# Patient Record
Sex: Male | Born: 1940
Health system: Southern US, Community
[De-identification: ages and names within clinical notes are randomized; demographics above are authoritative.]

## PROBLEM LIST (undated history)

## (undated) DIAGNOSIS — G629 Polyneuropathy, unspecified: Secondary | ICD-10-CM

## (undated) DIAGNOSIS — F419 Anxiety disorder, unspecified: Secondary | ICD-10-CM

## (undated) DIAGNOSIS — E1140 Type 2 diabetes mellitus with diabetic neuropathy, unspecified: Secondary | ICD-10-CM

## (undated) DIAGNOSIS — H499 Unspecified paralytic strabismus: Secondary | ICD-10-CM

## (undated) DIAGNOSIS — F09 Unspecified mental disorder due to known physiological condition: Secondary | ICD-10-CM

## (undated) DIAGNOSIS — N529 Male erectile dysfunction, unspecified: Secondary | ICD-10-CM

## (undated) DIAGNOSIS — R002 Palpitations: Secondary | ICD-10-CM

## (undated) DIAGNOSIS — K635 Polyp of colon: Secondary | ICD-10-CM

## (undated) DIAGNOSIS — E785 Hyperlipidemia, unspecified: Secondary | ICD-10-CM

## (undated) DIAGNOSIS — H49889 Other paralytic strabismus, unspecified eye: Secondary | ICD-10-CM

## (undated) DIAGNOSIS — M1990 Unspecified osteoarthritis, unspecified site: Secondary | ICD-10-CM

## (undated) DIAGNOSIS — R3129 Other microscopic hematuria: Secondary | ICD-10-CM

## (undated) DIAGNOSIS — K649 Unspecified hemorrhoids: Secondary | ICD-10-CM

## (undated) DIAGNOSIS — J986 Disorders of diaphragm: Secondary | ICD-10-CM

## (undated) DIAGNOSIS — C4490 Unspecified malignant neoplasm of skin, unspecified: Secondary | ICD-10-CM

## (undated) DIAGNOSIS — R269 Unspecified abnormalities of gait and mobility: Secondary | ICD-10-CM

## (undated) DIAGNOSIS — F0390 Unspecified dementia without behavioral disturbance: Secondary | ICD-10-CM

## (undated) DIAGNOSIS — I4891 Unspecified atrial fibrillation: Secondary | ICD-10-CM

## (undated) DIAGNOSIS — I2510 Atherosclerotic heart disease of native coronary artery without angina pectoris: Secondary | ICD-10-CM

## (undated) HISTORY — PX: HIP SURGERY: SHX245

## (undated) HISTORY — DX: Anxiety disorder, unspecified: F41.9

## (undated) HISTORY — DX: Unspecified malignant neoplasm of skin, unspecified: C44.90

## (undated) HISTORY — DX: Other microscopic hematuria: R31.29

## (undated) HISTORY — DX: Polyp of colon: K63.5

## (undated) HISTORY — DX: Type 2 diabetes mellitus with diabetic neuropathy, unspecified: E11.40

## (undated) HISTORY — DX: Other paralytic strabismus, unspecified eye: H49.889

## (undated) HISTORY — PX: ANKLE FUSION: SHX881

## (undated) HISTORY — PX: APPENDECTOMY: SHX54

## (undated) HISTORY — DX: Unspecified mental disorder due to known physiological condition: F09

## (undated) HISTORY — DX: Unspecified osteoarthritis, unspecified site: M19.90

## (undated) HISTORY — PX: CARPAL TUNNEL RELEASE: SHX101

## (undated) HISTORY — DX: Male erectile dysfunction, unspecified: N52.9

## (undated) HISTORY — DX: Hyperlipidemia, unspecified: E78.5

## (undated) HISTORY — DX: Atherosclerotic heart disease of native coronary artery without angina pectoris: I25.10

## (undated) HISTORY — DX: Unspecified atrial fibrillation: I48.91

## (undated) HISTORY — DX: Disorders of diaphragm: J98.6

## (undated) HISTORY — DX: Unspecified paralytic strabismus: H49.9

---

## 1962-04-09 HISTORY — PX: HIP SURGERY: SHX245

## 2001-01-03 ENCOUNTER — Encounter (INDEPENDENT_AMBULATORY_CARE_PROVIDER_SITE_OTHER): Payer: Self-pay | Admitting: Specialist

## 2001-01-03 ENCOUNTER — Other Ambulatory Visit: Admission: RE | Admit: 2001-01-03 | Discharge: 2001-01-03 | Payer: Self-pay | Admitting: Gastroenterology

## 2003-04-10 HISTORY — PX: KNEE SURGERY: SHX244

## 2003-05-11 ENCOUNTER — Inpatient Hospital Stay (HOSPITAL_COMMUNITY): Admission: RE | Admit: 2003-05-11 | Discharge: 2003-05-13 | Payer: Self-pay | Admitting: Orthopedic Surgery

## 2004-02-11 ENCOUNTER — Ambulatory Visit: Payer: Self-pay | Admitting: Internal Medicine

## 2004-02-28 ENCOUNTER — Ambulatory Visit: Payer: Self-pay | Admitting: Internal Medicine

## 2004-06-07 ENCOUNTER — Ambulatory Visit: Payer: Self-pay | Admitting: Internal Medicine

## 2004-06-15 ENCOUNTER — Ambulatory Visit: Payer: Self-pay | Admitting: Internal Medicine

## 2004-08-29 ENCOUNTER — Ambulatory Visit: Payer: Self-pay | Admitting: Internal Medicine

## 2004-12-12 ENCOUNTER — Ambulatory Visit: Payer: Self-pay | Admitting: Internal Medicine

## 2005-01-12 ENCOUNTER — Ambulatory Visit: Payer: Self-pay | Admitting: Internal Medicine

## 2005-05-31 ENCOUNTER — Ambulatory Visit: Payer: Self-pay | Admitting: Internal Medicine

## 2005-07-30 ENCOUNTER — Ambulatory Visit: Payer: Self-pay | Admitting: Internal Medicine

## 2005-11-06 ENCOUNTER — Ambulatory Visit: Payer: Self-pay | Admitting: Internal Medicine

## 2005-12-31 ENCOUNTER — Ambulatory Visit: Payer: Self-pay | Admitting: Internal Medicine

## 2006-02-01 ENCOUNTER — Ambulatory Visit: Payer: Self-pay | Admitting: Internal Medicine

## 2006-02-01 LAB — CONVERTED CEMR LAB
ALT: 37 units/L (ref 0–40)
MCHC: 33.6 g/dL (ref 30.0–36.0)
MCV: 90.1 fL (ref 78.0–100.0)
Microalb Creat Ratio: 7 mg/g (ref 0.0–30.0)
RBC: 4.55 M/uL (ref 4.22–5.81)

## 2006-02-18 ENCOUNTER — Ambulatory Visit: Payer: Self-pay | Admitting: Internal Medicine

## 2006-03-19 ENCOUNTER — Ambulatory Visit: Payer: Self-pay | Admitting: Internal Medicine

## 2006-03-19 LAB — CONVERTED CEMR LAB: PSA: 1.5 ng/mL (ref 0.10–4.00)

## 2006-05-04 DIAGNOSIS — E119 Type 2 diabetes mellitus without complications: Secondary | ICD-10-CM | POA: Insufficient documentation

## 2006-05-04 DIAGNOSIS — M1990 Unspecified osteoarthritis, unspecified site: Secondary | ICD-10-CM | POA: Insufficient documentation

## 2006-05-04 DIAGNOSIS — Z860100 Personal history of colon polyps, unspecified: Secondary | ICD-10-CM | POA: Insufficient documentation

## 2006-05-04 DIAGNOSIS — I10 Essential (primary) hypertension: Secondary | ICD-10-CM

## 2006-05-04 DIAGNOSIS — I2510 Atherosclerotic heart disease of native coronary artery without angina pectoris: Secondary | ICD-10-CM

## 2006-05-04 DIAGNOSIS — F528 Other sexual dysfunction not due to a substance or known physiological condition: Secondary | ICD-10-CM

## 2006-06-03 ENCOUNTER — Encounter: Admission: RE | Admit: 2006-06-03 | Discharge: 2006-06-03 | Payer: Self-pay | Admitting: Orthopedic Surgery

## 2006-06-11 ENCOUNTER — Encounter: Admission: RE | Admit: 2006-06-11 | Discharge: 2006-06-11 | Payer: Self-pay | Admitting: Orthopedic Surgery

## 2006-08-16 ENCOUNTER — Ambulatory Visit: Payer: Self-pay | Admitting: Internal Medicine

## 2006-08-16 LAB — CONVERTED CEMR LAB
Chloride: 103 meq/L (ref 96–112)
Hgb A1c MFr Bld: 8.2 % — ABNORMAL HIGH (ref 4.6–6.1)
Potassium: 4.2 meq/L (ref 3.5–5.3)

## 2006-10-01 ENCOUNTER — Ambulatory Visit: Payer: Self-pay | Admitting: Internal Medicine

## 2006-10-01 DIAGNOSIS — E785 Hyperlipidemia, unspecified: Secondary | ICD-10-CM

## 2006-10-02 ENCOUNTER — Telehealth (INDEPENDENT_AMBULATORY_CARE_PROVIDER_SITE_OTHER): Payer: Self-pay | Admitting: *Deleted

## 2006-10-03 ENCOUNTER — Encounter: Payer: Self-pay | Admitting: Internal Medicine

## 2006-10-03 ENCOUNTER — Emergency Department (HOSPITAL_COMMUNITY): Admission: EM | Admit: 2006-10-03 | Discharge: 2006-10-03 | Payer: Self-pay | Admitting: Emergency Medicine

## 2006-10-12 ENCOUNTER — Inpatient Hospital Stay (HOSPITAL_COMMUNITY): Admission: EM | Admit: 2006-10-12 | Discharge: 2006-10-14 | Payer: Self-pay | Admitting: Emergency Medicine

## 2006-10-14 ENCOUNTER — Ambulatory Visit: Payer: Self-pay | Admitting: Internal Medicine

## 2006-10-21 ENCOUNTER — Encounter: Payer: Self-pay | Admitting: Internal Medicine

## 2006-10-23 ENCOUNTER — Encounter: Payer: Self-pay | Admitting: Internal Medicine

## 2006-10-26 ENCOUNTER — Encounter: Payer: Self-pay | Admitting: Internal Medicine

## 2006-10-30 ENCOUNTER — Telehealth (INDEPENDENT_AMBULATORY_CARE_PROVIDER_SITE_OTHER): Payer: Self-pay | Admitting: *Deleted

## 2006-11-14 ENCOUNTER — Encounter: Payer: Self-pay | Admitting: Internal Medicine

## 2006-11-21 ENCOUNTER — Telehealth: Payer: Self-pay | Admitting: Internal Medicine

## 2006-12-02 ENCOUNTER — Ambulatory Visit: Payer: Self-pay | Admitting: Internal Medicine

## 2006-12-02 DIAGNOSIS — Z96649 Presence of unspecified artificial hip joint: Secondary | ICD-10-CM | POA: Insufficient documentation

## 2006-12-04 ENCOUNTER — Telehealth (INDEPENDENT_AMBULATORY_CARE_PROVIDER_SITE_OTHER): Payer: Self-pay | Admitting: *Deleted

## 2006-12-04 LAB — CONVERTED CEMR LAB
ALT: 14 units/L (ref 0–53)
AST: 16 units/L (ref 0–37)
BUN: 15 mg/dL (ref 6–23)
Creatinine, Ser: 0.6 mg/dL (ref 0.4–1.5)
Hgb A1c MFr Bld: 5.9 % (ref 4.6–6.0)

## 2006-12-09 ENCOUNTER — Encounter: Payer: Self-pay | Admitting: Internal Medicine

## 2006-12-19 ENCOUNTER — Ambulatory Visit: Payer: Self-pay | Admitting: Internal Medicine

## 2006-12-19 DIAGNOSIS — F411 Generalized anxiety disorder: Secondary | ICD-10-CM | POA: Insufficient documentation

## 2007-03-31 ENCOUNTER — Telehealth (INDEPENDENT_AMBULATORY_CARE_PROVIDER_SITE_OTHER): Payer: Self-pay | Admitting: *Deleted

## 2007-04-01 ENCOUNTER — Encounter: Payer: Self-pay | Admitting: Internal Medicine

## 2007-04-01 ENCOUNTER — Encounter (INDEPENDENT_AMBULATORY_CARE_PROVIDER_SITE_OTHER): Payer: Self-pay | Admitting: *Deleted

## 2007-04-10 HISTORY — PX: HIP SURGERY: SHX245

## 2007-04-29 ENCOUNTER — Ambulatory Visit: Payer: Self-pay | Admitting: Internal Medicine

## 2007-05-01 ENCOUNTER — Encounter: Payer: Self-pay | Admitting: Internal Medicine

## 2007-05-05 LAB — CONVERTED CEMR LAB: Hgb A1c MFr Bld: 6.9 % — ABNORMAL HIGH (ref 4.6–6.0)

## 2007-05-19 ENCOUNTER — Telehealth (INDEPENDENT_AMBULATORY_CARE_PROVIDER_SITE_OTHER): Payer: Self-pay | Admitting: *Deleted

## 2007-05-20 ENCOUNTER — Encounter: Payer: Self-pay | Admitting: Internal Medicine

## 2007-05-20 ENCOUNTER — Telehealth (INDEPENDENT_AMBULATORY_CARE_PROVIDER_SITE_OTHER): Payer: Self-pay | Admitting: *Deleted

## 2007-05-21 ENCOUNTER — Telehealth (INDEPENDENT_AMBULATORY_CARE_PROVIDER_SITE_OTHER): Payer: Self-pay | Admitting: *Deleted

## 2007-09-19 ENCOUNTER — Ambulatory Visit: Payer: Self-pay | Admitting: Internal Medicine

## 2007-09-22 LAB — CONVERTED CEMR LAB
ALT: 25 units/L (ref 0–53)
AST: 22 units/L (ref 0–37)
Calcium: 9.4 mg/dL (ref 8.4–10.5)
Creatinine, Ser: 0.8 mg/dL (ref 0.4–1.5)
Creatinine,U: 36.4 mg/dL
Eosinophils Absolute: 0.2 10*3/uL (ref 0.0–0.7)
GFR calc Af Amer: 123 mL/min
HDL: 44.6 mg/dL (ref >39.0)
Lymphocytes Relative: 20.7 % (ref 12.0–46.0)
Platelets: 255 10*3/uL (ref 150–400)
Potassium: 4.3 meq/L (ref 3.5–5.1)
Sodium: 139 meq/L (ref 135–145)
Triglycerides: 29 mg/dL (ref 0–149)
VLDL: 6 mg/dL (ref 0–40)
WBC: 5.5 10*3/uL (ref 4.5–10.5)

## 2007-11-16 ENCOUNTER — Encounter: Payer: Self-pay | Admitting: Internal Medicine

## 2007-11-17 ENCOUNTER — Encounter: Payer: Self-pay | Admitting: Internal Medicine

## 2007-11-25 ENCOUNTER — Encounter: Payer: Self-pay | Admitting: Internal Medicine

## 2007-12-02 ENCOUNTER — Encounter: Payer: Self-pay | Admitting: Internal Medicine

## 2007-12-08 ENCOUNTER — Encounter: Payer: Self-pay | Admitting: Internal Medicine

## 2007-12-16 ENCOUNTER — Encounter: Payer: Self-pay | Admitting: Internal Medicine

## 2007-12-17 ENCOUNTER — Telehealth (INDEPENDENT_AMBULATORY_CARE_PROVIDER_SITE_OTHER): Payer: Self-pay | Admitting: *Deleted

## 2008-01-06 ENCOUNTER — Encounter: Payer: Self-pay | Admitting: Internal Medicine

## 2008-01-09 ENCOUNTER — Telehealth (INDEPENDENT_AMBULATORY_CARE_PROVIDER_SITE_OTHER): Payer: Self-pay | Admitting: *Deleted

## 2008-02-16 ENCOUNTER — Ambulatory Visit: Payer: Self-pay | Admitting: Internal Medicine

## 2008-06-24 ENCOUNTER — Ambulatory Visit: Payer: Self-pay | Admitting: Internal Medicine

## 2008-06-29 LAB — CONVERTED CEMR LAB
ALT: 19 units/L (ref 0–53)
BUN: 14 mg/dL (ref 6–23)
Basophils Absolute: 0 10*3/uL (ref 0.0–0.1)
Basophils Relative: 0.2 % (ref 0.0–3.0)
Calcium: 8.7 mg/dL (ref 8.4–10.5)
Cholesterol: 171 mg/dL (ref 0–200)
Creatinine,U: 116.3 mg/dL
GFR calc non Af Amer: 141.56 mL/min (ref >60)
Glucose, Bld: 115 mg/dL — ABNORMAL HIGH (ref 70–99)
HDL: 48.2 mg/dL (ref >39.00)
Hemoglobin: 13.6 g/dL (ref 13.0–17.0)
LDL Cholesterol: 114 mg/dL — ABNORMAL HIGH (ref 0–99)
Lymphs Abs: 0.5 10*3/uL — ABNORMAL LOW (ref 0.7–4.0)
Microalb, Ur: 1.8 mg/dL (ref 0.0–1.9)
Monocytes Absolute: 0.3 10*3/uL (ref 0.1–1.0)
Potassium: 3.9 meq/L (ref 3.5–5.1)
Sodium: 138 meq/L (ref 135–145)
Total CHOL/HDL Ratio: 4

## 2008-08-18 ENCOUNTER — Ambulatory Visit: Payer: Self-pay | Admitting: Family Medicine

## 2008-10-12 ENCOUNTER — Encounter: Payer: Self-pay | Admitting: Internal Medicine

## 2008-10-14 ENCOUNTER — Ambulatory Visit: Payer: Self-pay | Admitting: Family Medicine

## 2008-11-30 ENCOUNTER — Encounter: Payer: Self-pay | Admitting: Internal Medicine

## 2008-12-30 ENCOUNTER — Encounter: Payer: Self-pay | Admitting: Internal Medicine

## 2009-01-05 ENCOUNTER — Ambulatory Visit: Payer: Self-pay | Admitting: Internal Medicine

## 2009-01-18 ENCOUNTER — Ambulatory Visit: Payer: Self-pay | Admitting: Internal Medicine

## 2009-02-02 ENCOUNTER — Encounter: Payer: Self-pay | Admitting: Internal Medicine

## 2009-02-08 ENCOUNTER — Ambulatory Visit: Payer: Self-pay | Admitting: Internal Medicine

## 2009-09-14 ENCOUNTER — Encounter: Payer: Self-pay | Admitting: Internal Medicine

## 2009-12-15 ENCOUNTER — Telehealth (INDEPENDENT_AMBULATORY_CARE_PROVIDER_SITE_OTHER): Payer: Self-pay | Admitting: *Deleted

## 2009-12-21 ENCOUNTER — Ambulatory Visit: Payer: Self-pay | Admitting: Internal Medicine

## 2009-12-26 LAB — CONVERTED CEMR LAB
CO2: 28 meq/L (ref 19–32)
Chloride: 105 meq/L (ref 96–112)
Creatinine, Ser: 0.7 mg/dL (ref 0.4–1.5)
GFR calc non Af Amer: 117.99 mL/min (ref >60)
Glucose, Bld: 138 mg/dL — ABNORMAL HIGH (ref 70–99)
Potassium: 4.8 meq/L (ref 3.5–5.1)
Sodium: 142 meq/L (ref 135–145)
TSH: 1.74 microintl units/mL (ref 0.35–5.50)

## 2010-03-15 ENCOUNTER — Encounter: Payer: Self-pay | Admitting: Internal Medicine

## 2010-04-11 ENCOUNTER — Ambulatory Visit
Admission: RE | Admit: 2010-04-11 | Discharge: 2010-04-11 | Payer: Self-pay | Source: Home / Self Care | Attending: Internal Medicine | Admitting: Internal Medicine

## 2010-04-13 LAB — CONVERTED CEMR LAB
Basophils Relative: 0.5 % (ref 0.0–3.0)
CO2: 29 meq/L (ref 19–32)
Eosinophils Absolute: 0.1 10*3/uL (ref 0.0–0.7)
Glucose, Bld: 109 mg/dL — ABNORMAL HIGH (ref 70–99)
Hemoglobin: 13.9 g/dL (ref 13.0–17.0)
Lymphocytes Relative: 21.2 % (ref 12.0–46.0)
Monocytes Relative: 7 % (ref 3.0–12.0)
Platelets: 223 10*3/uL (ref 150.0–400.0)
WBC: 5.7 10*3/uL (ref 4.5–10.5)

## 2010-05-07 LAB — CONVERTED CEMR LAB
AST: 21 units/L
Albumin: 4.2 g/dL
Alkaline Phosphatase: 101 units/L (ref 39–117)
Alkaline Phosphatase: 83 units/L
Alkaline Phosphatase: 88 units/L
Basophils Relative: 0.1 % (ref 0.0–1.0)
Calcium: 9 mg/dL
Chloride, Serum: 104 mmol/L
Cholesterol: 131 mg/dL
Cholesterol: 149 mg/dL (ref 0–200)
Creatinine, Ser: 0.8 mg/dL
Glucose, Bld: 92 mg/dL
HDL: 48.6 mg/dL (ref >39.0)
HDL: 51 mg/dL
HDL: 55 mg/dL
Hemoglobin: 10.8 g/dL
Hemoglobin: 13.4 g/dL
Hgb A1c MFr Bld: 6.4 %
LDL Cholesterol: 89 mg/dL (ref 0–99)
Lymphocytes Relative: 12.8 % (ref 12.0–46.0)
MCHC: 32.9 g/dL
MCV: 89.8 fL (ref 78.0–100.0)
Monocytes Absolute: 0.6 10*3/uL (ref 0.2–0.7)
Monocytes Relative: 6.8 % (ref 3.0–11.0)
Neutrophils Relative %: 79.1 % — ABNORMAL HIGH (ref 43.0–77.0)
Platelets: 206 10*3/uL
Potassium, serum: 4.4 mmol/L
RBC count: 3.86 10*6/uL
RBC: 4.27 M/uL (ref 4.22–5.81)
RDW: 13.1 % (ref 11.5–14.6)
Sodium, serum: 138 mmol/L
TIBC: 344.7 ug/dL
Total Bilirubin: 0.6 mg/dL
Total Bilirubin: 0.7 mg/dL
Total CHOL/HDL Ratio: 3.1
Total Protein: 7.2 g/dL
Triglycerides: 57 mg/dL (ref 0–149)
WBC, blood: 6.2 10*3/uL
WBC: 8.4 10*3/uL (ref 4.5–10.5)

## 2010-05-09 NOTE — Progress Notes (Signed)
Summary: appt--has been sched  Phone Note Outgoing Call   Call placed by: Army Fossa CMA,  December 15, 2009 8:46 AM Summary of Call: Pt needs a 6 month f/u with Dr.Paz. Army Fossa CMA  December 15, 2009 8:46 AM   Follow-up for Phone Call        appt sched for 9/15 at 8:20am Follow-up by: Jerolyn Shin,  December 15, 2009 2:51 PM

## 2010-05-09 NOTE — Assessment & Plan Note (Signed)
Summary: 6 month followup--fasting labs???///sph   Vital Signs:  Patient profile:   70 year old male Height:      78 inches Weight:      275 pounds BMI:     31.89 Pulse rate:   62 / minute Pulse rhythm:   regular BP sitting:   118 / 70  (left arm) Cuff size:   large  Vitals Entered By: Army Fossa CMA (December 21, 2009 8:15 AM) CC: 6 month f/u- Fasting Comments pharm- [facility] [address] [place] Flu shot   History of Present Illness: routine office visit Diabetes --  last year, his A1c was below 5, they discontinued glipizide and he stayed on Lantus. A1c  3 months ago at the [place] was 6.8 Hypertension-- ambulatory BPs  120/60s, good medication compliance  HYPERLIPIDEMIA -- reports  a TC of 130  3 months ago at the [place]   Osteoarthritis-- doing great, very active, back doing his bike  Anxiety-- on fluoxetine , symptoms well controlled     Current Medications (verified): 1)  Metformin Hcl 1000 Mg Tabs (Metformin Hcl) .... Take 1 Tablet By Mouth Twice A Day 2)  Lantus Solostar 100 Unit/ml  Soln (Insulin Glargine) .Marland KitchenMarland KitchenMarland Kitchen 15u At Bedtime 3)  Furosemide 20 Mg  Tabs (Furosemide) .Marland Kitchen.. 1 By Mouth Once Daily 4)  Cozaar 50 Mg  Tabs (Losartan Potassium) .Marland Kitchen.. 1 By Mouth Qd 5)  Simvastatin 80 Mg  Tabs (Simvastatin) .Marland Kitchen.. 1 By Mouth Once Daily 6)  Prilosec Otc 20 Mg  Tbec (Omeprazole Magnesium) .Marland Kitchen.. 1 By Mouth Once Daily 7)  Chromium Picolinate .Marland Kitchen.. 400 2 By Mouth Once Daily 8)  Fish Oil   Oil (Fish Oil) 9)  B 12 10)  Mvi 11)  Asa .Marland KitchenMarland Kitchen. 1 A Day 12)  Athletes Foot Cream 1 %  Crea (Terbinafine Hcl) 13)  Tennis Shoes .... Dx Diabetes With Neuropathy 14)  Claritin 10 Mg Tabs (Loratadine) .Marland Kitchen.. 1 By Mouth Once Daily 15)  Fluoxetine Hcl 20 Mg Caps (Fluoxetine Hcl) .... Take 2 Caples Daily 16)  Astepro 0.15 % Soln (Azelastine Hcl) .... 2 Sprays On Each Side of The Nose Daily  Allergies (verified): No Known Drug Allergies  Past History:  Past Medical History: Reviewed history from 02/08/2009 and  no changes required. 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 Carpal tunnel release (2000) bilateral Hip fracture , remote  (R) 1964 (2 pins) knee surgery  2005 Appendectomy 10-2006:  HIP FRACTURE, RIGHT--s/p hip replacement, s/p revision August 2009 due to dislocation x 3   Social History: Married working FT  still rydes bike, upto 10 miles sometimes tobacco-- quit in the 80s  ETOH-- rarely   Review of Systems CV:  Denies chest pain or discomfort and swelling of feet; 3 months ago had episode of a heart rate of around 119, his usual heart rate is in the 60s. This was in the setting of resting, lasted 2 hours; there was no chest pain, diaphoresis, shortness of breath or syncope.Marland Kitchen Resp:  Denies cough and shortness of breath. GI:  Denies diarrhea, nausea, and vomiting.  Physical Exam  General:  alert, well-developed, and well-nourished.  Lungs:  normal respiratory effort, no intercostal retractions, no accessory muscle use, and normal breath sounds.   Heart:  normal rate, regular rhythm, and no murmur.   Extremities:  no edema Psych:  Oriented X3, memory intact for recent and remote, normally interactive, good eye contact, not anxious appearing, and not depressed appearing.     Impression & Recommendations:  Problem # 1:  HYPERLIPIDEMIA (ICD-272.4) total cholesterol 153 months ago at the [place].  He is on a high dose of simvastatin and evidently tolerating well. No change His updated medication list for this problem includes:    Simvastatin 80 Mg Tabs (Simvastatin) .Marland Kitchen... 1 by mouth once  daily  Labs Reviewed: SGOT: 21 (11/30/2008)   SGPT: 25 (11/30/2008)   HDL:51 (11/30/2008), 48.20 (06/24/2008)  LDL:79 (11/30/2008), 114 (16/01/9603)  Chol:143 (11/30/2008), 171 (06/24/2008)  Trig:67 (11/30/2008), 45.0 (06/24/2008)  Orders: TLB-ALT (SGPT) (84460-ALT) TLB-AST (SGOT) (84450-SGOT) TLB-TSH (Thyroid Stimulating Hormone) (84443-TSH)  Problem # 2:  HYPERTENSION (ICD-401.9) well-controlled His updated medication list for this problem includes:    Furosemide 20 Mg Tabs (Furosemide) .Marland Kitchen... 1 by mouth once daily    Cozaar 50 Mg Tabs (Losartan potassium) .Marland Kitchen... 1 by mouth qd  BP today: 118/70 Prior BP: 150/70 (02/08/2009)  Labs Reviewed: K+: 4.4 (11/30/2008) Creat: : 0.80 (11/30/2008)   Chol: 143 (11/30/2008)   HDL: 51 (11/30/2008)   LDL: 79 (11/30/2008)   TG: 67 (11/30/2008)  Orders: TLB-BMP (Basic Metabolic Panel-BMET) (80048-METABOL) Specimen Handling (54098)  Problem # 3:  DIABETES MELLITUS, TYPE II (ICD-250.00)    last year, his A1c was below 5, they discontinued glipizide and he stayed on Lantus. A1c above 3 months ago at the [place] was 6.8 labs The following medications were removed from the medication list:    Glipizide 10 Mg Tabs (Glipizide) .Marland Kitchen... 1/2 tablet twice a day His updated medication list for this problem includes:    Metformin Hcl 1000 Mg Tabs (Metformin hcl) .Marland Kitchen... Take 1 tablet by mouth twice a day    Lantus Solostar 100 Unit/ml Soln (Insulin glargine) .Marland KitchenMarland KitchenMarland KitchenMarland Kitchen 15u at bedtime    Cozaar 50 Mg Tabs (Losartan potassium) .Marland Kitchen... 1 by mouth qd  Labs Reviewed: Creat: 0.80 (11/30/2008)    Reviewed HgBA1c results: 4.1 (11/30/2008)  6.1 (06/24/2008)  Orders: Venipuncture (11914) TLB-A1C / Hgb A1C (Glycohemoglobin) (83036-A1C) Specimen Handling (78295)  Problem # 4:  CORONARY ARTERY DISEASE (ICD-414.00) single episode of tachycardia 3 months ago, no associated symptoms. see history of present illness Recommend observation, patient will call if this happens again  or if he has actual palpitations  His updated medication list for this problem includes:    Furosemide 20 Mg Tabs (Furosemide) .Marland Kitchen... 1 by mouth once daily    Cozaar 50 Mg Tabs (Losartan potassium) .Marland Kitchen... 1 by mouth qd  Complete Medication List: 1)  Metformin Hcl 1000 Mg Tabs (Metformin hcl) .... Take 1 tablet by mouth twice a day 2)  Lantus Solostar 100 Unit/ml Soln (Insulin glargine) .Marland KitchenMarland KitchenMarland Kitchen 15u at bedtime 3)  Furosemide 20 Mg Tabs (Furosemide) .Marland Kitchen.. 1 by mouth once daily 4)  Cozaar 50 Mg Tabs (Losartan potassium) .Marland Kitchen.. 1 by mouth qd 5)  Simvastatin 80 Mg Tabs (Simvastatin) .Marland Kitchen.. 1 by mouth once daily 6)  Prilosec Otc 20 Mg Tbec (Omeprazole magnesium) .Marland Kitchen.. 1 by mouth once daily 7)  Chromium Picolinate  .Marland Kitchen.. 400 2 by mouth once daily 8)  Fish Oil Oil (Fish oil) 9)  B 12  10)  Mvi  11)  Asa  .Marland Kitchen.. 1 a  day 12)  Athletes Foot Cream 1 % Crea (Terbinafine hcl) 13)  Tennis Shoes  .... Dx diabetes with neuropathy 14)  Claritin 10 Mg Tabs (Loratadine) .Marland Kitchen.. 1 by mouth once daily 15)  Fluoxetine Hcl 20 Mg Caps (Fluoxetine hcl) .... Take 2 caples daily 16)  Astepro 0.15 % Soln (Azelastine hcl) .... 2 sprays on each side of the nose daily  Other Orders: Flu Vaccine 39yrs + MEDICARE PATIENTS (E4540) Administration Flu vaccine - MCR (J8119)  Patient Instructions: 1)  Please schedule a follow-up appointment in 4 months , fasting, physical exam Flu Vaccine Consent Questions     Do you have a history of severe allergic reactions to this vaccine? no    Any prior history of allergic reactions to egg and/or gelatin? no    Do you have a sensitivity to the preservative Thimersol? no    Do you have a past history of Guillan-Barre Syndrome? no    Do you currently have an acute febrile illness? no    Have you ever had a severe reaction to latex? no    Vaccine information given and explained to patient? yes    Are you currently pregnant? no    Lot Number:[id number]   Exp Date:10/07/2010   Site Given  Left  Deltoid IM 1)  Please schedule a follow-up appointment in 4 months , fasting, physical exam     .[email or web address]

## 2010-05-09 NOTE — Letter (Signed)
Summary: Fax Regarding Clinical Research Trial for Shingles Vaccine  Fax Regarding Clinical Research Trial for Shingles Vaccine   Imported By: Lanelle Bal 09/22/2009 09:48:44  _____________________________________________________________________  External Attachment:    Type:   Image     Comment:   External Document

## 2010-05-11 NOTE — Assessment & Plan Note (Signed)
Summary: yearly check /cbs   Vital Signs:  Patient profile:   70 year old male Height:      78 inches Weight:      251.13 pounds Pulse rate:   82 / minute Pulse rhythm:   regular BP sitting:   124 / 70  (left arm) Cuff size:   large  Vitals Entered By: Army Fossa CMA (April 11, 2010 8:00 AM) CC: CPX, fasting- no complaints  Comments [facility] [address]    History of Present Illness: Here for Medicare AWV: 1.   Risk factors based on Past M, S, F history:reviewed  2.   Physical Activities: extremely active, bike 3.   Depression/mood: doing well, no depression reported or noted  4.   Hearing: wnl reportedly , no problems noted during normal conversatio  5.   ADL's:totally  independent  6.   Fall Risk: low risk  7.   Home Safety: does feel safe at home 8.   Height, weight, &visual acuity:see VS, uses glases  9.   Counseling: yes, see a/p 10.   Labs ordered based on risk factors: yes  11.           Referral Coordination, if needed  12.           Care Plan, see a/p  13.            Cognitive Assessment: motor , memory and cognition seem normal-appropiate  in addition, we discussed the following  Diabetes -- currently on 20u once daily, CBGs   ~ 100 or less in AM, 150 at night  A1C 7.1 at the [place] 3 months ago Hypertension-- ambulatory BPs   ~ 120/70s sometimes lower ; pulse in the 50 (no sx) good medication compliance  HYPERLIPIDEMIA -- good medication compliance  Osteoarthritis-- h/o hip replacement , f/u by ortho    Preventive Screening-Counseling & Management  Alcohol-Tobacco     Smoking Status: never  Caffeine-Diet-Exercise     Does Patient Exercise: no  Current Medications (verified): 1)  Metformin Hcl 1000 Mg Tabs (Metformin Hcl) .... Take 1 Tablet By Mouth Twice A Day 2)  Lantus Solostar 100 Unit/ml  Soln (Insulin Glargine) .... 20 U At Bedtime 3)  Furosemide 20 Mg  Tabs (Furosemide) .Marland Kitchen.. 1 By Mouth Once Daily 4)  Cozaar 50 Mg  Tabs (Losartan Potassium)  .Marland Kitchen.. 1 By Mouth Qd 5)  Simvastatin 80 Mg  Tabs (Simvastatin) .Marland Kitchen.. 1 By Mouth Once Daily 6)  Prilosec Otc 20 Mg  Tbec (Omeprazole Magnesium) .Marland Kitchen.. 1 By Mouth Once Daily 7)  Chromium Picolinate .Marland Kitchen.. 400 2 By Mouth Once Daily 8)  Fish Oil   Oil (Fish Oil) 9)  B 12 10)  Mvi 11)  Asa .Marland KitchenMarland Kitchen. 1 A Day 12)  Athletes Foot Cream 1 %  Crea (Terbinafine Hcl) 13)  Tennis Shoes .... Dx Diabetes With Neuropathy 14)  Claritin 10 Mg Tabs (Loratadine) .Marland Kitchen.. 1 By Mouth Once Daily 15)  Fluoxetine Hcl 20 Mg Caps (Fluoxetine Hcl) .... Take 2 Caples Daily 16)  Astepro 0.15 % Soln (Azelastine Hcl) .... 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 Carpal tunnel release (2000) bilateral Hip fracture , remote  (R) 1964 (2 pins) knee surgery  2005 Appendectomy 10-2006:  HIP FRACTURE, RIGHT--s/p hip replacement, s/p revision August 2009 due to dislocation x 3   Family History: Reviewed history from 02/08/2009 and no changes required. colon ca--no prostate ca--no MI-- no  DM-- GM, aunt COPD-- father, smoker   Social History: Reviewed history from 12/21/2009 and no changes required. Married working FT  still rydes bike, upto 10 miles sometimes tobacco-- quit in the 80s  ETOH-- rarely Smoking Status:  never Does Patient Exercise:  no  Review of Systems CV:  Denies chest pain or discomfort, palpitations, and swelling of feet. Resp:  Denies cough and  wheezing. GI:  Denies bloody stools, nausea, and vomiting. GU:  Denies hematuria, urinary frequency, and urinary hesitancy.  Physical Exam  General:  alert, well-developed, and well-nourished.   Neck:  normal carotid upstroke.  no thyromegaly Lungs:  normal respiratory effort, no intercostal retractions, no accessory muscle use, and normal breath sounds.   Heart:  normal rate, regular rhythm, and no murmur.   Abdomen:  soft, non-tender, no distention, and no masses.   no hepatomegaly and no splenomegaly.   Rectal:  done at the [place] approximately 6-11 per patient Extremities:  no  lower extremity edema Psych:  Cognition and judgment appear intact. Alert and cooperative with normal attention span and concentration.    Impression & Recommendations:  Problem # 1:  HEALTH SCREENING (ICD-V70.0)  Td 2003 pneumonia shot 2002, had another one at the [place] later  had flu shot this year had a shingles shot  already  Cscope 2006 and  2010 @ the [place]. Last cscope was  negative, next 2015 normal PSA at this office last year, reportedly had a PSA-DRE at the [place]  ~ 9/11  Diet and exercise discussed recommend also to  bring his results from the [place]  Orders: Medicare -1st Annual Wellness Visit [phone])  Problem # 2:  HYPERLIPIDEMIA (ICD-272.4) good medication compliance Reportedly had a FLP 9-11 @ [place]  and it was excellent His updated medication list for this problem includes:    Simvastatin 80 Mg Tabs (Simvastatin) .Marland Kitchen... 1 by mouth once daily  Labs Reviewed: SGOT: 16 (12/21/2009)   SGPT: 17 (12/21/2009)   HDL:51 (11/30/2008), 48.20 (06/24/2008)  LDL:79 (11/30/2008), 114 (98/02/9146)  Chol:143 (11/30/2008), 171 (06/24/2008)  Trig:67 (11/30/2008), 45.0 (06/24/2008)  Problem # 3:  HYPERTENSION (ICD-401.9) at goal  His updated medication list for this problem includes:    Furosemide 20 Mg Tabs (Furosemide) .Marland Kitchen... 1 by mouth once daily    Cozaar 50 Mg Tabs (Losartan potassium) .Marland Kitchen... 1 by mouth  qd  BP today: 124/70 Prior BP: 118/70 (12/21/2009)  Labs Reviewed: K+: 4.8 (12/21/2009) Creat: : 0.7 (12/21/2009)   Chol: 143 (11/30/2008)   HDL: 51 (11/30/2008)   LDL: 79 (11/30/2008)   TG: 67 (11/30/2008)  Orders: TLB-CBC Platelet - w/Differential (85025-CBCD) TLB-BMP (Basic Metabolic Panel-BMET) (80048-METABOL) Specimen Handling (82956)  Problem # 4:  CORONARY ARTERY DISEASE (ICD-414.00) Very active, asymptomatic. His updated medication list for this problem includes:    Furosemide 20 Mg Tabs (Furosemide) .Marland Kitchen... 1 by mouth once daily    Cozaar 50 Mg Tabs (Losartan potassium) .Marland Kitchen... 1 by mouth qd    Aspirin 81 Mg Tbec (Aspirin) ..... One daily  Labs Reviewed:  Chol: 143 (11/30/2008)   HDL: 51 (11/30/2008)   LDL: 79 (11/30/2008)   TG: 67 (11/30/2008)  Problem # 5:  DIABETES MELLITUS, TYPE II (ICD-250.00) seems to be  well-controlled.  labs. His updated medication list for this problem includes:    Metformin Hcl 1000 Mg Tabs (Metformin hcl) .Marland Kitchen... Take 1 tablet by mouth twice a day    Lantus Solostar 100 Unit/ml Soln (Insulin glargine) .Marland Kitchen... 20 u at bedtime    Cozaar 50 Mg Tabs (Losartan potassium) .Marland Kitchen... 1 by mouth qd    Aspirin 81 Mg Tbec (Aspirin) ..... One daily  Labs Reviewed: Creat: 0.7 (12/21/2009)    Reviewed HgBA1c results: 7.3 (12/21/2009)  4.1 (11/30/2008)  Orders: Venipuncture (16109) TLB-A1C / Hgb A1C (Glycohemoglobin) (83036-A1C) Specimen Handling (60454)  Complete Medication List: 1)  Metformin Hcl 1000 Mg Tabs (Metformin hcl) .... Take 1 tablet by mouth twice a day 2)  Lantus Solostar 100 Unit/ml Soln (Insulin glargine) .... 20 u at bedtime 3)  Furosemide 20 Mg Tabs (Furosemide) .Marland Kitchen.. 1 by mouth once daily 4)  Cozaar 50 Mg Tabs (Losartan potassium) .Marland Kitchen.. 1 by mouth qd 5)  Simvastatin 80 Mg Tabs (Simvastatin) .Marland Kitchen.. 1 by mouth once daily 6)  Prilosec Otc 20 Mg Tbec (Omeprazole magnesium) .Marland Kitchen.. 1 by mouth once daily 7)  Chromium Picolinate  .Marland Kitchen.. 400 2 by mouth  once daily 8)  Fish Oil Oil (Fish oil) 9)  B 12  10)  Mvi  11)  Aspirin 81 Mg Tbec (Aspirin) .... One daily 12)  Athletes Foot Cream 1 % Crea (Terbinafine hcl) 13)  Tennis Shoes  .... Dx diabetes with neuropathy 14)  Claritin 10 Mg Tabs (Loratadine) .Marland Kitchen.. 1 by mouth once daily 15)  Fluoxetine Hcl 20 Mg Caps (Fluoxetine hcl) .... Take 2 caples daily 16)  Astepro 0.15 % Soln (Azelastine hcl) .... 2 sprays on each side of the nose daily  Patient Instructions: 1)  Please schedule a follow-up appointment in 4 to 6  months .    Orders Added: 1)  Venipuncture [36415] 2)  TLB-A1C / Hgb A1C (Glycohemoglobin) [83036-A1C] 3)  TLB-CBC Platelet - w/Differential [85025-CBCD] 4)  TLB-BMP (Basic Metabolic Panel-BMET) [80048-METABOL] 5)  Specimen Handling [99000] 6)  Medicare -1st Annual Wellness Visit [G0438] 7)  Est. Patient Level III [09811]   Immunization History:  Zostavax History:    Zostavax # 1:  zostavax (10/07/2009)   Immunization History:  Zostavax History:    Zostavax # 1:  Zostavax (10/07/2009)   Risk Factors:  Tobacco use:  never Alcohol use:  no Exercise:  no

## 2010-06-08 ENCOUNTER — Telehealth (INDEPENDENT_AMBULATORY_CARE_PROVIDER_SITE_OTHER): Payer: Self-pay | Admitting: *Deleted

## 2010-06-08 DIAGNOSIS — M79609 Pain in unspecified limb: Secondary | ICD-10-CM | POA: Insufficient documentation

## 2010-06-08 DIAGNOSIS — M6530 Trigger finger, unspecified finger: Secondary | ICD-10-CM | POA: Insufficient documentation

## 2010-06-15 NOTE — Progress Notes (Signed)
Summary: referral for ortho  Phone Note Call from Patient Call back at Home Phone [phone]   Caller: Patient Summary of Call: Has appt for [place] [facility] on Mon. at 3:40pm Dr. Karolee Stamps for L hand painful and trigger finger but was told that he needed referral first due to insurance Initial call taken by: Doristine Devoid CMA,  June 08, 2010 2:34 PM  New Problems: TRIGGER FINGER, LEFT MIDDLE (ICD-727.03) HAND PAIN, LEFT (ICD-729.5)   New Problems: TRIGGER FINGER, LEFT MIDDLE (ICD-727.03) HAND PAIN, LEFT (ICD-729.5)

## 2010-08-22 NOTE — Discharge Summary (Signed)
Ivan Anderson, [name]                  ACCOUNT NO.:  [id number]   MEDICAL RECORD NO.:  [id number]          PATIENT TYPE:  INP   LOCATION:  5029                         FACILITY:  [facility]   PHYSICIAN:  Valerie A. Felicity Coyer, MDDATE OF BIRTH:  1940/07/03   DATE OF ADMISSION:  10/12/2006  DATE OF DISCHARGE:  10/14/2006                               DISCHARGE SUMMARY   DISCHARGE DIAGNOSES:  1. Fall prior to admission with right shoulder contusion and pain,      failure to thrive, improved with narcotic adjustment.  2. 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 Chaney Malling.  3. Type 2 diabetes.  4. Dyslipidemia.  5. Hypertension.  6. Gastroesophageal reflux disease.   DISCHARGE MEDICATIONS:  MS Contin 60 mg p.o. b.i.d. scheduled, which is  new since admission.   Other medications are as prior to admission without change and include:  1. OxyIR 10 mg p.o. q.3-4h. p.r.n. breakthrough pain.  2. Lasix 20 mg once daily.  3. Metformin 1000 mg b.i.d.  4. Glipizide 10 mg b.i.d.  5. Repaglinide 1 mg p.o. t.i.d.  6. Actos 45 mg daily.  7. Potassium 10 mEq p.o. b.i.d.  8. Omeprazole 20 mg p.o. daily.  9. Claritin 10 mg q.a.m.  10.Losartan 25 mg p.o. daily.  11.Simvastatin 40 mg p.o. daily.  12.Aspirin 325 mg p.o. daily.  13.Multiple vitamins including vitamin B12, B6 multi, combination fish      oil, chromium picolinate daily as previously supplemented.   HOSPITAL FOLLOWUP:  Is scheduled with orthopedist, Dr. Chaney Malling, for  August 4 surgery as scheduled, sooner if and as needed.  Also with  primary care physician, Dr. Willow Ora, for routine medical issues as  needed.   CONDITION ON DISCHARGE:  Medically improved, pain controlled and for  discharge home.  Will ask for home health PT, OT safety evaluation and  treatment if and as needed for conditioning preoperatively in  anticipation of surgical repair and recent falls.   CONSULTS  THIS HOSPITALIZATION:  Include PT and OT, notification  orthopedic service but not formally consulted this hospitalization.   HOSPITAL COURSE BY PROBLEM:  #1 - FALL WITH CONTUSION.  The patient is a  pleasant but uncomfortable 70 year old gentleman with end-stage  osteoarthritis who is awaiting hip replacement who fell while getting  out the shower the morning of admission.  Please see admission H&P for  these details.  Despite negative acute injury or fractures on x-ray,  because of inability to go home in his state of pain with recent  exacerbation by fall, he was admitted for pain control and further  evaluation.  He was placed on MS Contin 60 mg b.i.d. with very good  results over the following 48 hours.  He was continued on his p.r.n.  OxyIR which he has needed very little of and has been seen in therapy  consultation who has was agreed that the patient has resumed activities  of daily living with adequate pain control at this time.  There are no  plans for immediate surgical indication and as there is no new fracture,  no other inpatient workup was necessitated at this time.  Outpatient  followup with Dr. Chaney Malling for surgery as previously scheduled, with  contact to their our office for acute other problems in the meanwhile.  He has been provided a 48-month supply of MS Contin for continuity of  long-acting pain relief and may continue using OxyIR as needed.  Review  of use of this medication with the patient and his wife was undertaken  at bedside prior to formal discharge.   #2 - OTHER MEDICAL ISSUES.  The patient's other chronic medical issues  are as listed and are without acute change.  These are, again, chronic  and stable and he will continue his home medications without change as  treatment was ongoing prior to admission.      Valerie A. Felicity Coyer, MD  Electronically Signed     VAL/MEDQ  D:  10/14/2006  T:  10/14/2006  Job:  [id number]

## 2010-08-22 NOTE — H&P (Signed)
Ivan Anderson, Ivan Anderson                  ACCOUNT NO.:  [id number]   MEDICAL RECORD NO.:  [id number]          PATIENT TYPE:  INP   LOCATION:  1823                         FACILITY:  [facility]   PHYSICIAN:  Georgina Quint. Plotnikov, MDDATE OF BIRTH:  19-May-1940   DATE OF ADMISSION:  10/12/2006  DATE OF DISCHARGE:                              HISTORY & PHYSICAL   CHIEF COMPLAINT:  Pain in the right hip and pain in the right shoulder.  Unable to ambulate or take care of himself.   HISTORY OF PRESENT ILLNESS:  The patient is a 70 year old male with  severe right hip arthritis who is waiting for a hip replacement in  August.  He fell twice in the past 2 days, the last was this morning.  He was unable to get up.  He hit his right shoulder.  He is now unable  to use crutches.  Very frustrated.  He rates his pain at 8/10.  He has  no nausea or vomiting.  He denies head injury.   PAST HISTORY:  1. Chronic osteoarthritis.  2. Type 2 diabetes.  3. Dyslipidemia.  4. Hypertension.   CURRENT MEDICATIONS:  1. Furosemide 20 mg daily.  2. Metformin 1000 mg twice a day.  3. Glipizide 10 mg twice daily.  4. Repaglinide 1 mg 3 times a day.  5. Actos 45 mg daily.  6. Potassium 10 mEq 2 tablets daily.  7. Naftin cream b.i.d.  8. Omeprazole 20 mg daily.  9. Loratadine 10 mg daily.  10.Losartan 50 mg one-half once daily.  11.Simvastatin 80 mg one-half daily.  12.Aspirin one daily.  13.Vitamins.   ALLERGIES:  NO KNOWN DRUG ALLERGIES.   SOCIAL HISTORY:  He is married.  He does not smoke or drink.   FAMILY HISTORY:  Positive for coronary disease.   REVIEW OF SYSTEMS:  Unable to take care of himself at home.  Unable to  ambulate with crutches due to shoulder pain.  Uncontrolled pain.  He is  constipated at times.  No nausea or vomiting.  No chest pain.  The rest  of the 10-point review of systems is as above or negative.   PHYSICAL EXAMINATION:  VITAL SIGNS:  Blood pressure 164/75, heart rate  69,  respirations 20, temperature 97.0,  and sats 96% on room air.  GENERAL:  He is in no acute distress.  He is upset.  HEENT:  Moist mucosa.  NECK:  Supple.  LUNGS:  Clear.  No wheezes or rales.  HEART:  S1 and S2.  No murmur.  No gallop.  ABDOMEN:  Soft, nontender.  No organomegaly felt.  EXTREMITIES:  Lower extremities without edema.  Knees with postop scars.  Right hip is tender to palpation and range of motion.  Right shoulder is  tender with range of motion.  No abrasions visible.  NEUROLOGICAL:  He is alert and cooperative.  RECTAL:  Deferred.   LABS:  Chest x-ray revealed lungs with low volume.  Right foot x-ray  negative for fracture.  Right hip, 2 views, showed postoperative  advanced degenerative changes.  No fracture.  ASSESSMENT/PLAN:  1. Right shoulder injury, contusion.  X-ray negative for fracture.  2. Right hip pain due to end-stage osteoarthritis and aggravated by      the fall.  3. Right foot pain.  Negative for fracture.  4. Uncontrolled pain.  Discussed with the patient.  We will      discontinue oxycodone as that is not working according to the      patient.  We will start the patient on morphine extended release,      morphine ER, and morphine IV for pain breakthrough.  We will start      him on Tylenol.  5. Failure to thrive due to multiple orthopedic issues.  Obtain      occupational therapy/physical therapy consult.  Case manager      consult.  The patient was told that surgery is not an option next      week.  I spoke with Dr. Cleophas Dunker.  They will check on him later.  6. Type 2 diabetes.  Continue with oral medications and sliding scale.      Hold repaglinide.  7. GERD (gastroesophageal reflux disease).  Continue current therapy.      Georgina Quint. Plotnikov, MD  Electronically Signed     AVP/MEDQ  D:  10/12/2006  T:  10/12/2006  Job:  [id number]   cc:   Willow Ora, MD  Lenard Galloway. Chaney Malling, M.D.
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
## 2010-08-25 NOTE — Op Note (Signed)
Ivan Anderson, Ivan Anderson                            ACCOUNT NO.:  [id number]   MEDICAL RECORD NO.:  [id number]                   PATIENT TYPE:  INP   LOCATION:  2550                                 FACILITY:  [facility]   PHYSICIAN:  Lenard Galloway. Chaney Malling, M.D.           DATE OF BIRTH:  03-01-1941   DATE OF PROCEDURE:  05/11/2003  DATE OF DISCHARGE:                                 OPERATIVE REPORT   PREOPERATIVE DIAGNOSIS:  End stage osteoarthritis of the left knee.   POSTOPERATIVE DIAGNOSIS:  End stage osteoarthritis of the left knee.   PROCEDURE:  Total knee replacement on the left using a large size left  cemented femoral component with an NBT cemented size 6 tibial tray with a  large size poly insert and large size tray pegged cemented patella all with  bone glue.   SURGEON:  Lenard Galloway. Chaney Malling, M.D.   ASSISTANT:  Madilyn Fireman, P.A.-C.   ANESTHESIA:  General.   PROCEDURE:  The patient was placed on the operating table in the supine  position and with a pneumatic tourniquet about the left upper thigh, the  lower extremity was prepped with Duraprep and draped out in the usual  manner.  The leg was then wrapped out in an Esmarch and the tourniquet was  elevated.  An incision was made starting above the patella and carried down  to the tibial tubercle.  The skin edges were retracted and bleeders were  anticoagulated.  A long median parapatellar incision was made using the  cutting cautery.  The patella was everted.  Excellent access to the joint  was obtained.  The margins of the distal femur were debrided with a rongeur  and the femur was sized.  The knee was then flexed to 90 degrees and both  medial and lateral meniscus and the cruciate ligament were excised.  The  tibial guide #1 was placed over the anterior aspect of the tibia and placed  upon itself at the appropriate cutting level. It was locked in place with  fixation pins.  Using a power saw, the proximal end of the tibia was  amputated.  This was removed and a nice flesh cut was achieved.  Attention  was then turned to the distal end of the femur.  The anterior part of the  femur was debrided with a rongeur and a rasp was used.  The femoral guide #1  was placed over the anterior aspect of the femur and held in position in the  midline. A drill hole was placed through the guide.  The Sterling guide was  applied and anterior and posterior cuts were made over the distal end of the  femur.  At this point a 10 mm flexion gap space was put in place and both  medial and lateral collateral ligaments were balanced beautifully.  Guide #2  was placed over the cut surface of the anterior part of  the tibia with  intramedullary rod in place.  Four degrees of valgus was set into ithe  guide.  This was locked in place.  The distal end of the femur was amputated  and with the knee in full extension a 10 mm spacer block fit very nicely  with excellent balancing of both collateral ligaments. All debris was  removed.  All excess portions of the menisci were removed.  The femoral  guide was then placed over the distal end of the femur, locked in place and  chamfer cuts were made.  Drill holes were made and the guide was then  removed. All excess bone was removed. The hole in the distal end of the  femur was packed with bone.  Attention was then turned to the proximal  tibia.  The tibia subluxed anteriorly. A size 6 Kiel was locked in place.  A  drill hole was made using the tunneler.  A broach was then passed into the  proximal tibia through the trial plate.  Fixation pins were removed. A 10 mm  trial was inserted.  The femoral trial was inserted in the distal end of the  femur and the knee was articulated.  The knee was put through a full range  of motion.  There was full flexion and slight hyperextension possible with  excellent balancing of the ligaments in all phases of flexion and extension.  Attention was then turned to the  posterior aspect of the patella.  This was  amputated using the patellar cutting guide.  Three drill holes were made in  the posterior aspect of the patella and the patella trial was inserted.  The  knee was put through a full range of motion and the patella tracked nicely  in the midline with no tilt.  No AP drawer was noted.  No tendency to spit  poly was seen. This was actually an excellent fit.  All the components were  removed.  Pulsatile lavage was used as was antibiotic solution.  All debris  was removed.  Once this was completed, the glue was mixed.  Each of the  components were then inserted and first the tibia tray was inserted in a  standard manner and excess glue was removed.  The glue was placed over the  distal end of the femur and the posterior runners of the femoral component.  The femoral component was driven into ithe distal femur and excess glue was  removed.  The trial insert was placed between the tibia and distal femur and  the knee was articulated and placed to full extension.  All excess glue was  removed.  The glue was then placed in the posterior aspect of the patella  and the final patella was put in place and held in place with a patella  clamp.  Excess glue was removed. Once the glue had cured the knee was flexed  and all excess glue was removed with an osteotome.  There was wonderful  range of motion and wonderful stability in all positions.  The trial poly  was removed.  The tourniquet was dropped.  All bleeders were coagulated. A  fairly dry field was achieved. Again, antibiotic solution and pulsatile  lavage was used to remove all excess debris.  The final poly was then  inserted and the knee was articulated and was extremely stable.  A Hemovac  drain was inserted.  The long medial parapatellar incision was closed with  interrupted heavy Ethibond sutures.  Ethibond  was used to close the long medial parapatellar incision and subcutaneous tissue.  Stainless  steel  staples were used to close the skin and sterile dressings were applied.  The  patient was returned to the recovery room in excellent condition.  The  techniques went extremely well.   DRAINS:  Hemovac drain.   COMPLICATIONS:  None.                                               Rodney A. Chaney Malling, M.D.    RAM/MEDQ  D:  05/11/2003  T:  05/11/2003  Job:  [id number]

## 2010-12-22 ENCOUNTER — Other Ambulatory Visit: Payer: Self-pay | Admitting: Internal Medicine

## 2010-12-25 NOTE — Telephone Encounter (Signed)
Pt last seen 04/11/10. Pls advise.

## 2011-01-23 LAB — COMPREHENSIVE METABOLIC PANEL
ALT: 37
AST: 25
Alkaline Phosphatase: 108
BUN: 15
CO2: 29
Calcium: 9.4
GFR calc Af Amer: >60
Glucose, Bld: 143 — ABNORMAL HIGH
Potassium: 4.1
Sodium: 137
Total Protein: 6.1

## 2011-01-23 LAB — CBC
MCV: 88.7
Platelets: 404 — ABNORMAL HIGH
WBC: 7

## 2011-01-23 LAB — URINALYSIS, ROUTINE W REFLEX MICROSCOPIC
Hgb urine dipstick: NEGATIVE
Ketones, ur: NEGATIVE
Nitrite: NEGATIVE

## 2011-02-07 ENCOUNTER — Ambulatory Visit (INDEPENDENT_AMBULATORY_CARE_PROVIDER_SITE_OTHER): Payer: Medicare Other

## 2011-02-07 DIAGNOSIS — Z23 Encounter for immunization: Secondary | ICD-10-CM

## 2011-06-20 ENCOUNTER — Ambulatory Visit (INDEPENDENT_AMBULATORY_CARE_PROVIDER_SITE_OTHER): Payer: Medicare Other | Admitting: Internal Medicine

## 2011-06-20 VITALS — BP 118/68 | HR 60 | Temp 97.9°F | Wt 254.0 lb

## 2011-06-20 DIAGNOSIS — J069 Acute upper respiratory infection, unspecified: Secondary | ICD-10-CM

## 2011-06-20 NOTE — Progress Notes (Signed)
  Subjective:    Patient ID: Ivan Anderson, male    DOB: 1940-10-11, 71 y.o.   MRN: [id number]  HPI Acute visit 1 week h/o RN, ST, mild dry cough. Nasal rinsing is helping and overall he is better.   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 Carpal tunnel release (2000) bilateral Hip fracture , remote  (R) 1964 (2 pins) knee surgery  2005 Appendectomy 10-2006:  HIP FRACTURE, RIGHT--s/p hip replacement, s/p revision August 2009 due to dislocation x 3   Review of Systems No f/c No chest congestion-wheezing noitchy eyes-nose     Objective:   Physical Exam  Constitutional: He appears well-developed and well-nourished. No distress.  HENT:  Head: Normocephalic and atraumatic.  Right Ear: External ear normal.  Left Ear: External ear normal.  Mouth/Throat: No oropharyngeal exudate.       Nose slt congested , face symmetric, no TTP  Cardiovascular: Normal rate, regular rhythm and normal heart sounds.   No murmur heard. Pulmonary/Chest: Effort normal and breath sounds normal. No respiratory distress. He has no wheezes. He has no rales.  Skin: He is not diaphoretic.       Assessment & Plan:   URI, see instructions, if no better consider abx  As far as chronic medical issues, [facility] HTN-DM- chol control

## 2011-06-20 NOTE — Patient Instructions (Signed)
Rest, fluids , tylenol For cough, take Mucinex DM twice a day as needed  For congestion use dymista 2 sprays on each side of the nose at night until sample gone  Call if no better in few days Call anytime if the symptoms are severe, you have high fever, short of breath, persistent chest pain

## 2011-06-21 ENCOUNTER — Encounter: Payer: Self-pay | Admitting: Internal Medicine

## 2011-06-22 ENCOUNTER — Ambulatory Visit: Payer: Medicare Other | Admitting: Internal Medicine

## 2011-08-30 ENCOUNTER — Other Ambulatory Visit: Payer: Self-pay | Admitting: Internal Medicine

## 2011-08-31 NOTE — Telephone Encounter (Signed)
Refill done.  

## 2011-12-26 ENCOUNTER — Ambulatory Visit (INDEPENDENT_AMBULATORY_CARE_PROVIDER_SITE_OTHER): Payer: Medicare Other | Admitting: Internal Medicine

## 2011-12-26 ENCOUNTER — Encounter: Payer: Self-pay | Admitting: Internal Medicine

## 2011-12-26 VITALS — BP 126/64 | HR 57 | Temp 98.0°F | Wt 253.0 lb

## 2011-12-26 DIAGNOSIS — Z23 Encounter for immunization: Secondary | ICD-10-CM

## 2011-12-26 DIAGNOSIS — E119 Type 2 diabetes mellitus without complications: Secondary | ICD-10-CM

## 2011-12-26 DIAGNOSIS — E785 Hyperlipidemia, unspecified: Secondary | ICD-10-CM

## 2011-12-26 DIAGNOSIS — M1990 Unspecified osteoarthritis, unspecified site: Secondary | ICD-10-CM

## 2011-12-26 NOTE — Assessment & Plan Note (Signed)
Excellent control, A1c last month 6.6 Follow up at the [facility]

## 2011-12-26 NOTE — Progress Notes (Signed)
  Subjective:    Patient ID: Ivan Anderson, male    DOB: [birth year], 71 y.o.   MRN: [id number]  HPI Here to discuss the following issues, he brought several records that were reviewed: --Recent fall, was eval @ [facility] 12/21/2011, prescribed ibuprofen for a hip contusion. XR (-); accident happened at home while doing yard work. concerned because he has a large bump in the area --Nuclear Stress test performed May 2013 for evaluation of chest pain: Negative for ischemia, slightly dilated left ventricle. --Labs 8--2013: Cholesterol 181, LDL 113, HDL 61. A1c 6.6.   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 Carpal tunnel release (2000) bilateral Hip fracture , remote  (R) 1964 (2 pins) knee surgery  2005 Appendectomy 10-2006:  HIP FRACTURE, RIGHT--s/p hip replacement, s/p revision August 2009 due to dislocation x 3   Family History: colon ca--no prostate ca--no MI-- no  DM-- GM, aunt COPD-- father, smoker   Social History: Married, working FT  still rydes bike, upto 10 miles sometimes tobacco-- quit in the 80s  ETOH-- rarely Smoking Status:  never Does Patient Exercise:  no   Review of Systems Denies actual pain in the right hip except when he lays on the right side, range of motion is normal. Had chest pain a few months ago, symptoms resolved, denies lower extremity edema or shortness of breath No nausea, vomiting, diarrhea.     Objective:   Physical Exam    Constitutional: He appears well-developed and well-nourished. No distress.  Cardiovascular: Normal rate, regular rhythm and normal heart sounds.   No murmur heard. Pulmonary/Chest: Effort normal and breath sounds normal. No respiratory distress. He has no wheezes. He has no rales.  Musculoskeletal:       Legs:      Right hip range of motion normal  Skin: He is not diaphoretic.          Assessment & Plan:   Hip contusion, fortunately he seems to be doing well, hematoma and ecchymosis should resolve on its own. He was prescribed ibuprofen but not taken it, recommend Tylenol as needed Will needs to see ortho if he has more pain, decreased range of motion.

## 2011-12-26 NOTE — Assessment & Plan Note (Signed)
LDL 116, goal less than 100. Recommend to increase simvastatin to 40 mg to 80 mg, watch for side effects, he follows up at the [place] otherwise.

## 2011-12-26 NOTE — Assessment & Plan Note (Signed)
See above, hip contusion

## 2011-12-26 NOTE — Patient Instructions (Addendum)
Please increase your simvastatin to 80 mg daily, watch for side effects such as aches and pains. [facility].

## 2012-03-23 ENCOUNTER — Other Ambulatory Visit: Payer: Self-pay | Admitting: Internal Medicine

## 2012-03-24 NOTE — Telephone Encounter (Signed)
Is not listed in his active med list, please call pt, if he is taking it, ok 90 days and 1 RF

## 2012-03-24 NOTE — Telephone Encounter (Signed)
Is pt still currently taking this med? Please advise.

## 2012-03-25 NOTE — Telephone Encounter (Signed)
Discussed with pt. He is currently taking this med.  Refill done.

## 2012-04-17 ENCOUNTER — Telehealth: Payer: Self-pay | Admitting: *Deleted

## 2012-04-17 NOTE — Telephone Encounter (Signed)
Health assessment form faxed to our office for completion. Pt would like to know status of form.Please advise

## 2012-04-17 NOTE — Telephone Encounter (Signed)
I got a form to clear him and his wife for exercise, form was completed

## 2012-04-17 NOTE — Telephone Encounter (Signed)
Pt made aware forms were faxed yesterday.

## 2012-05-20 ENCOUNTER — Ambulatory Visit (INDEPENDENT_AMBULATORY_CARE_PROVIDER_SITE_OTHER): Payer: BC Managed Care – PPO | Admitting: Internal Medicine

## 2012-05-20 ENCOUNTER — Encounter: Payer: Self-pay | Admitting: Internal Medicine

## 2012-05-20 ENCOUNTER — Telehealth: Payer: Self-pay | Admitting: Internal Medicine

## 2012-05-20 VITALS — BP 126/64 | HR 64 | Temp 98.2°F | Wt 246.0 lb

## 2012-05-20 DIAGNOSIS — J988 Other specified respiratory disorders: Secondary | ICD-10-CM

## 2012-05-20 MED ORDER — AZELASTINE HCL 0.1 % NA SOLN: 2.0000 | Freq: Two times a day (BID) | NASAL | Status: DC

## 2012-05-20 MED ORDER — AMOXICILLIN 500 MG PO CAPS: 1000.0000 mg | ORAL_CAPSULE | Freq: Two times a day (BID) | ORAL | Status: DC

## 2012-05-20 NOTE — Progress Notes (Signed)
  Subjective:    Patient ID: Ivan Anderson, male    DOB: [birth year], 72 y.o.   MRN: [id number]  HPI Acute visit Symptoms started yesterday: Sinus congestion, clear nasal discharge, cough, sneezing. Had headaches yesterday. Using "Tylenol Cold" and a saline nasal wash.  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 Carpal tunnel release (2000) bilateral Hip fracture , remote  (R) 1964 (2 pins) knee surgery  2005 Appendectomy 10-2006:  HIP FRACTURE, RIGHT--s/p hip replacement, s/p revision August 2009 due to dislocation x 3   Family History: colon ca--no prostate ca--no MI-- no   DM-- GM, aunt COPD-- father, smoker   Social History: Married, working FT   still rydes bike, upto 10 miles sometimes tobacco-- quit in the 80s   ETOH-- rarely Smoking Status:  never Does Patient Exercise:  no  Review of Systems Denies fever or chills. No sputum production. No nausea, vomiting, diarrhea or muscle aches.    Objective:   Physical Exam  General -- alert, well-developed, NAD .   NEENT -- TMs normal, throat w/o redness, face symmetric and not tender to palpation, nose quite congested  Lungs -- normal respiratory effort, no intercostal retractions, no accessory muscle use, and normal breath sounds.   Heart-- normal rate, regular rhythm, no murmur, and no gallop.   Extremities-- no pretibial edema bilaterally Psych-- Cognition and judgment appear intact. Alert and cooperative with normal attention  span and concentration.  not anxious appearing and not depressed appearing.       Assessment & Plan:   URI --- see instructions Chronic medical issues, followup elsewhere.

## 2012-05-20 NOTE — Telephone Encounter (Signed)
[facility] Triage Call Report Triage Record Num: [id number] Operator: Roselyn Meier Patient Name: Ivan Anderson Call Date & Time: 05/19/2012 5:20:54PM Patient Phone: [phone] PCP: Nolon Rod. Paz Patient Gender: Male PCP Fax : Patient DOB: 01-09-1941 Practice Name: [facility] Reason for Call: Caller: Ernestine/Patient; PCP: Willow Ora; CB#: [phone]; Call regarding Cough/Congestion; Onset 05/18/12 with fever (100-O), stuffy nose, cough and headache. Pt is requesting an antibiotic to be called in for him, pt states he is recovering from hip revision surgery on 04/24/12. Triaged patient per Upper Respiratory Infection Protocol. See Provider within 24 Hours Disposition for 'Pressure/pain above or below eyes, near ears over sinuses and yellow-green drainage from nose or down back of throat'. Care advice given per protocol. Appt scheduled for 0900 on 05/20/12 with Dr Drue Novel Protocol(s) Used: Upper Respiratory Infection (URI) Recommended Outcome per Protocol: See Provider within 24 hours Reason for Outcome: Pressure/pain above or below eyes, near ears over sinuses (may also be described as fullness, worsens when bending forward, teeth or eye pain) AND yellow-green drainage from nose or down back of throat. Care Advice: Drink more fluids -- water, low-sugar juices, tea and warm soup, especially chicken broth, are options. Avoid caffeinated or alcoholic beverages because they can increase the chance of dehydration. ~ Nasal Irrigation To Relieve Congestion: - Wash hands with soap and water. - Add 1/2 teaspoon salt to 1 cup warm water to make saltwater solution. - Use a bulb syringe to draw up the saltwater solution. Turn the bulb upright to squeeze out any air. Fill the syringe until is full. - Bend over sink bowl and lean slightly toward sink. Gently insert the tip of the syringe into nostril about 1/2 inch. Point tip toward outer corner of eye and GENTLY squeeze bulb to squirt  saltwater into nostril. Forceful irrigation to clear sinuses requires the use of sterile water or saline. - Let solution drain from nose. Solution may come out of the other nostril or even your mouth. Do two full syringes of solution in each nostril. - Rinse syringe in clean water several times. Be sure water comes out clear. Dry the bulb syringe and store in a container or plastic bag. ~ 05/19/2012 5:28:23PM Page 1 of 1 [id number]

## 2012-05-20 NOTE — Patient Instructions (Addendum)
Rest, fluids , tylenol For cough, take Mucinex DM twice a day as needed  For congestion use astelin nasal spray once or twice a day until you feel better Avoid decongestants such as pseudoephedrine or any cough suppressants with a "D"  You have a prescription for amoxicillin, do not use it unless you're not improving in the next 5 days. Call if no better next week. Call anytime if the symptoms are severe

## 2012-05-20 NOTE — Telephone Encounter (Signed)
Pt was seen today.

## 2012-08-07 HISTORY — PX: MEDIAL PARTIAL KNEE REPLACEMENT: SHX5965

## 2012-11-06 ENCOUNTER — Other Ambulatory Visit: Payer: Self-pay | Admitting: Internal Medicine

## 2012-11-07 NOTE — Telephone Encounter (Signed)
Refill done per protocol.  

## 2013-02-11 ENCOUNTER — Other Ambulatory Visit: Payer: Self-pay | Admitting: *Deleted

## 2013-02-11 MED ORDER — FLUOXETINE HCL 20 MG PO CAPS: ORAL_CAPSULE | ORAL | Status: DC

## 2013-02-11 NOTE — Telephone Encounter (Signed)
Prozac refilled for one month. OV due

## 2013-04-14 LAB — HM DIABETES EYE EXAM

## 2013-09-22 ENCOUNTER — Ambulatory Visit (INDEPENDENT_AMBULATORY_CARE_PROVIDER_SITE_OTHER): Payer: Medicare Other | Admitting: Internal Medicine

## 2013-09-22 ENCOUNTER — Encounter: Payer: Self-pay | Admitting: Internal Medicine

## 2013-09-22 VITALS — BP 150/73 | HR 61 | Temp 98.0°F | Ht 78.5 in | Wt 252.0 lb

## 2013-09-22 DIAGNOSIS — E119 Type 2 diabetes mellitus without complications: Secondary | ICD-10-CM

## 2013-09-22 DIAGNOSIS — F411 Generalized anxiety disorder: Secondary | ICD-10-CM

## 2013-09-22 DIAGNOSIS — Z96649 Presence of unspecified artificial hip joint: Secondary | ICD-10-CM

## 2013-09-22 DIAGNOSIS — I10 Essential (primary) hypertension: Secondary | ICD-10-CM

## 2013-09-22 DIAGNOSIS — I2510 Atherosclerotic heart disease of native coronary artery without angina pectoris: Secondary | ICD-10-CM

## 2013-09-22 MED ORDER — FLUOXETINE HCL 20 MG PO TABS: 40.0000 mg | ORAL_TABLET | Freq: Every day | ORAL | Status: DC

## 2013-09-22 NOTE — Patient Instructions (Signed)
Fluoxetine 20 mg: 1/2 tablet a day x 2 weeks Then 1 tablet a day x 2 weeks Then 1.5 tablets a day x 2 weeks Then 2 tablets a day  Call if problems or side effects  Next visit -- 3 months

## 2013-09-22 NOTE — Progress Notes (Signed)
Subjective:    Patient ID: Ivan Anderson, male    DOB: 16-Jun-1940, 73 y.o.   MRN: [id number]  DOS:  09/22/2013 Type of  Visit: Routine visit  Last office visit was more than a year ago. His main concern today is going back on fluoxetine.  was taking fluoxetine 40 mg daily and feeling well, self discontinued the medication 8 months ago, 6 months ago his wife noticed that he was Anxious and irritable. She suggested to go back on the medication, the patient is in agreement. He also would like me to refill the amoxicillin as needed prior to dental procedure. Other issues are taking care of at [name], he goes there every 6 for routine checks. Past surgical history has been updated. In general he feels well.  ROS Denies chest pain or difficulty breathing. He is very active doing heavy yard work, he still riding his mountain bike and goes to the gym from time to time. Denies any depression per se. Sleeps well. No symptoms consistent with low blood sugar, on a rare occasions his sugar goes down to the 70s.  Past Medical History  Diagnosis Date  . Diabetes mellitus with neuropathy   . Hyperlipidemia   . CAD (coronary artery disease)   . DJD (degenerative joint disease)   . Anxiety   . Erectile dysfunction   . Colon polyp     Cscope at [facility] aprox 2006, repeated 2010 (-), next 5 years  . Eye muscle paralysis     congenital  . Skin cancer     sees derm  . Diaphragm paralysis     R, per pt     Past Surgical History  Procedure Laterality Date  . Carpal tunnel release      B, 2000  . Hip surgery      R, 1964, pins  . Knee surgery       L 2005  . Appendectomy    . Hip surgery      RIGHT--s/p hip replacement, s/p revision August 2009 due to dislocation x 3   . Medial partial knee replacement  08-2012    R  . Hip surgery  04-2012    R hip revision    History   Social History  . Marital Status: Married    Spouse Name: N/A    Number of Children: N/A  . Years of Education: N/A    Occupational History  . realtor    Social History Main Topics  . Smoking status: Former Research scientist (life sciences)  . Smokeless tobacco: Never Used     Comment: quit 1980s  . Alcohol Use: Yes     Comment: rarely   . Drug Use: No  . Sexual Activity: Not on file   Other Topics Concern  . Not on file   Social History Narrative  . No narrative on file        Medication List       This list is accurate as of: 09/22/13 11:59 PM.  Always use your most recent med list.               amoxicillin 500 MG capsule  Commonly known as:  AMOXIL  Take 2 capsules (1,000 mg total) by mouth 2 (two) times daily.     azelastine 0.1 % nasal spray  Commonly known as:  ASTELIN  Place 2 sprays into the nose 2 (two) times daily. Use in each nostril as directed     FLUoxetine 20 MG  tablet  Commonly known as:  PROZAC  Take 2 tablets (40 mg total) by mouth daily.     LANTUS SOLOSTAR 100 UNIT/ML injection  Generic drug:  insulin glargine  Inject 15 Units into the skin at bedtime.     losartan 50 MG tablet  Commonly known as:  COZAAR  Take 50 mg by mouth daily.     metFORMIN 500 MG tablet  Commonly known as:  GLUCOPHAGE  Take 1,000 mg by mouth daily.     simvastatin 40 MG tablet  Commonly known as:  ZOCOR  Take 80 mg by mouth every evening.           Objective:   Physical Exam BP 150/73  Pulse 61  Temp(Src) 98 F (36.7 C)  Ht 6' 6.5" (1.994 m)  Wt 252 lb (114.306 kg)  BMI 28.75 kg/m2  SpO2 94%  General -- alert, well-developed, NAD.  Lungs -- normal respiratory effort, no intercostal retractions, no accessory muscle use, and normal breath sounds.  Heart-- normal rate, regular rhythm, no murmur.   Extremities-- no pretibial edema bilaterally  Neurologic--  alert & oriented X3. Speech normal, gait appropriate for age, strength symmetric and appropriate for age.   Psych-- Cognition and judgment appear intact. Cooperative with normal attention span and concentration. No anxious or depressed  appearing.       Assessment & Plan:  Patient left ~ 100 pages of  records from [name], they have been reviewed and pertinent records will be scanned  Today , I spent more than 25   min with the patient: >50% of the time counseling regards Anxiety, carefurly discussed plan with the patient. Also reviewing the chart and labs ordered by other providers

## 2013-09-22 NOTE — Assessment & Plan Note (Signed)
Followup at [name], reports his A1c's are consistently around 6.4

## 2013-09-22 NOTE — Assessment & Plan Note (Addendum)
Symptoms were well controlled, patient self discontinue the medication 8 months ago, now as suggested by his wife he likes to go back on medication. Plan: Gradually restart fluoxetine up to the previous dose of 40 mg which was working for him. If he feels that needs less medication he will let me know. See instructions

## 2013-09-22 NOTE — Assessment & Plan Note (Signed)
BP slightly elevated today, reports that home BP is now is 138/60. Plan: No change, follow up at [name]

## 2013-09-22 NOTE — Assessment & Plan Note (Signed)
Denies any symptoms, reports he had a stress test less than a year ago and it was completely normal

## 2013-09-22 NOTE — Progress Notes (Signed)
Pre visit review using our clinic review tool, if applicable. No additional management support is needed unless otherwise documented below in the visit note. 

## 2013-09-22 NOTE — Assessment & Plan Note (Signed)
Will call when needed for a prescription of amoxicillin prior to dental procedures due to h/o hip replacements

## 2013-09-23 ENCOUNTER — Telehealth: Payer: Self-pay | Admitting: Internal Medicine

## 2013-09-23 NOTE — Telephone Encounter (Signed)
Relevant patient education assigned to patient using Emmi. ° °

## 2013-10-22 ENCOUNTER — Telehealth: Payer: Self-pay

## 2013-10-22 DIAGNOSIS — E119 Type 2 diabetes mellitus without complications: Secondary | ICD-10-CM

## 2013-10-22 DIAGNOSIS — E785 Hyperlipidemia, unspecified: Secondary | ICD-10-CM

## 2013-10-22 NOTE — Telephone Encounter (Signed)
Diabetic Bundle:  Pt informed that we would like him to come in to have his LDL and A1C checked.  Pt scheduled appt for labs for tomorrow. Order placed and scheduled  Pt comes back on 12-23-13 for appt with Paz and check BP

## 2013-10-23 ENCOUNTER — Other Ambulatory Visit (INDEPENDENT_AMBULATORY_CARE_PROVIDER_SITE_OTHER): Payer: Medicare Other

## 2013-10-23 DIAGNOSIS — E119 Type 2 diabetes mellitus without complications: Secondary | ICD-10-CM

## 2013-10-23 DIAGNOSIS — E785 Hyperlipidemia, unspecified: Secondary | ICD-10-CM

## 2013-10-23 LAB — LIPID PANEL
Cholesterol: 141 mg/dL (ref 0–200)
HDL: 52 mg/dL (ref >39.00)
LDL Cholesterol: 85 mg/dL (ref 0–99)
NONHDL: 89
Total CHOL/HDL Ratio: 3
Triglycerides: 22 mg/dL (ref 0.0–149.0)
VLDL: 4.4 mg/dL (ref 0.0–40.0)

## 2013-10-23 LAB — HEMOGLOBIN A1C: HEMOGLOBIN A1C: 7.2 % — AB (ref 4.6–6.5)

## 2013-10-26 ENCOUNTER — Encounter: Payer: Self-pay | Admitting: *Deleted

## 2013-11-17 ENCOUNTER — Ambulatory Visit (INDEPENDENT_AMBULATORY_CARE_PROVIDER_SITE_OTHER): Payer: Medicare Other | Admitting: Nurse Practitioner

## 2013-11-17 ENCOUNTER — Encounter: Payer: Self-pay | Admitting: Nurse Practitioner

## 2013-11-17 VITALS — BP 118/71 | HR 54 | Temp 97.8°F | Ht 78.5 in | Wt 248.0 lb

## 2013-11-17 DIAGNOSIS — J069 Acute upper respiratory infection, unspecified: Secondary | ICD-10-CM

## 2013-11-17 NOTE — Patient Instructions (Signed)
You have a cold virus causing your symptoms. The average duration of cold symptoms is 14 days. Start daily sinus rinses (neilmed Sinus Rinse). Sip fluids every hour. Rest. If you are not feeling better in 1 week or develop fever or chest pain, call us for re-evaluation. Feel better!  Upper Respiratory Infection, Adult An upper respiratory infection (URI) is also sometimes known as the common cold. The upper respiratory tract includes the nose, sinuses, throat, trachea, and bronchi. Bronchi are the airways leading to the lungs. Most people improve within 1 week, but symptoms can last up to 2 weeks. A residual cough may last even longer.  CAUSES Many different viruses can infect the tissues lining the upper respiratory tract. The tissues become irritated and inflamed and often become very moist. Mucus production is also common. A cold is contagious. You can easily spread the virus to others by oral contact. This includes kissing, sharing a glass, coughing, or sneezing. Touching your mouth or nose and then touching a surface, which is then touched by another person, can also spread the virus. SYMPTOMS  Symptoms typically develop 1 to 3 days after you come in contact with a cold virus. Symptoms vary from person to person. They may include:  Runny nose.  Sneezing.  Nasal congestion.  Sinus irritation.  Sore throat.  Loss of voice (laryngitis).  Cough.  Fatigue.  Muscle aches.  Loss of appetite.  Headache.  Low-grade fever. DIAGNOSIS  You might diagnose your own cold based on familiar symptoms, since most people get a cold 2 to 3 times a year. Your caregiver can confirm this based on your exam. Most importantly, your caregiver can check that your symptoms are not due to another disease such as strep throat, sinusitis, pneumonia, asthma, or epiglottitis. Blood tests, throat tests, and X-rays are not necessary to diagnose a common cold, but they may sometimes be helpful in excluding other  more serious diseases. Your caregiver will decide if any further tests are required. RISKS AND COMPLICATIONS  You may be at risk for a more severe case of the common cold if you smoke cigarettes, have chronic heart disease (such as heart failure) or lung disease (such as asthma), or if you have a weakened immune system. The very young and very old are also at risk for more serious infections. Bacterial sinusitis, middle ear infections, and bacterial pneumonia can complicate the common cold. The common cold can worsen asthma and chronic obstructive pulmonary disease (COPD). Sometimes, these complications can require emergency medical care and may be life-threatening. PREVENTION  The best way to protect against getting a cold is to practice good hygiene. Avoid oral or hand contact with people with cold symptoms. Wash your hands often if contact occurs. There is no clear evidence that vitamin C, vitamin E, echinacea, or exercise reduces the chance of developing a cold. However, it is always recommended to get plenty of rest and practice good nutrition. TREATMENT  Treatment is directed at relieving symptoms. There is no cure. Antibiotics are not effective, because the infection is caused by a virus, not by bacteria. Treatment may include:  Increased fluid intake. Sports drinks offer valuable electrolytes, sugars, and fluids.  Breathing heated mist or steam (vaporizer or shower).  Eating chicken soup or other clear broths, and maintaining good nutrition.  Getting plenty of rest.  Using gargles or lozenges for comfort.  Controlling fevers with ibuprofen or acetaminophen as directed by your caregiver.  Increasing usage of your inhaler if you have asthma.  Zinc gel and zinc lozenges, taken in the first 24 hours of the common cold, can shorten the duration and lessen the severity of symptoms. Pain medicines may help with fever, muscle aches, and throat pain. A variety of non-prescription medicines are  available to treat congestion and runny nose. Your caregiver can make recommendations and may suggest nasal or lung inhalers for other symptoms.  HOME CARE INSTRUCTIONS   Only take over-the-counter or prescription medicines for pain, discomfort, or fever as directed by your caregiver.  Use a warm mist humidifier or inhale steam from a shower to increase air moisture. This may keep secretions moist and make it easier to breathe.  Drink enough water and fluids to keep your urine clear or pale yellow.  Rest as needed.  Return to work when your temperature has returned to normal or as your caregiver advises. You may need to stay home longer to avoid infecting others. You can also use a face mask and careful hand washing to prevent spread of the virus. SEEK MEDICAL CARE IF:   After the first few days, you feel you are getting worse rather than better.  You need your caregiver's advice about medicines to control symptoms.  You develop chills, worsening shortness of breath, or brown or red sputum. These may be signs of pneumonia.  You develop yellow or brown nasal discharge or pain in the face, especially when you bend forward. These may be signs of sinusitis.  You develop a fever, swollen neck glands, pain with swallowing, or white areas in the back of your throat. These may be signs of strep throat. SEEK IMMEDIATE MEDICAL CARE IF:   You have a fever.  You develop severe or persistent headache, ear pain, sinus pain, or chest pain.  You develop wheezing, a prolonged cough, cough up blood, or have a change in your usual mucus (if you have chronic lung disease).  You develop sore muscles or a stiff neck. Document Released: 09/19/2000 Document Revised: 06/18/2011 Document Reviewed: 07/28/2010 [facility] Patient Information 2014 [place], [place].

## 2013-11-17 NOTE — Progress Notes (Signed)
   Subjective:    Patient ID: Ivan Anderson, male    DOB: [birth year], 73 y.o.   MRN: [id number]  URI  This is a new problem. The current episode started in the past 7 days (5d). The problem has been unchanged. There has been no fever. Associated symptoms include congestion, coughing, headaches, sneezing, a sore throat and wheezing. Pertinent negatives include no abdominal pain, chest pain, diarrhea, ear pain, nausea, plugged ear sensation, sinus pain or vomiting. Treatments tried: dayquil, nyquil. The treatment provided no relief.      Review of Systems  Constitutional: Positive for fatigue. Negative for fever and chills.  HENT: Positive for congestion, sneezing and sore throat. Negative for ear pain.   Respiratory: Positive for cough and wheezing.   Cardiovascular: Negative for chest pain.  Gastrointestinal: Negative for nausea, vomiting, abdominal pain and diarrhea.  Neurological: Positive for headaches.       Objective:   Physical Exam  Vitals reviewed. Constitutional: He is oriented to person, place, and time. He appears well-developed and well-nourished. No distress.  HENT:  Head: Normocephalic and atraumatic.  Right Ear: External ear normal.  Left Ear: External ear normal.  Mouth/Throat: Oropharynx is clear and moist. No oropharyngeal exudate.  Eyes: Conjunctivae are normal. Right eye exhibits no discharge. Left eye exhibits no discharge.  Neck: Normal range of motion. Neck supple. No thyromegaly present.  Cardiovascular: Normal rate, regular rhythm and normal heart sounds.   Pulmonary/Chest: Effort normal and breath sounds normal. No respiratory distress. He has no wheezes. He has no rales.  Lymphadenopathy:    He has no cervical adenopathy.  Neurological: He is alert and oriented to person, place, and time.  Skin: Skin is warm and dry.  Psychiatric: He has a normal mood and affect. His behavior is normal. Thought content normal.          Assessment & Plan:  1. Acute  upper respiratory infections of unspecified site 5 days. Symptom management. F/u prn

## 2013-11-17 NOTE — Progress Notes (Signed)
Pre visit review using our clinic review tool, if applicable. No additional management support is needed unless otherwise documented below in the visit note. 

## 2013-12-23 ENCOUNTER — Ambulatory Visit (INDEPENDENT_AMBULATORY_CARE_PROVIDER_SITE_OTHER): Payer: Medicare Other | Admitting: Internal Medicine

## 2013-12-23 ENCOUNTER — Encounter: Payer: Self-pay | Admitting: Internal Medicine

## 2013-12-23 VITALS — BP 124/72 | HR 70 | Temp 98.2°F | Wt 247.1 lb

## 2013-12-23 DIAGNOSIS — E119 Type 2 diabetes mellitus without complications: Secondary | ICD-10-CM

## 2013-12-23 DIAGNOSIS — F0150 Vascular dementia without behavioral disturbance: Secondary | ICD-10-CM | POA: Insufficient documentation

## 2013-12-23 DIAGNOSIS — G3184 Mild cognitive impairment, so stated: Secondary | ICD-10-CM

## 2013-12-23 DIAGNOSIS — Z23 Encounter for immunization: Secondary | ICD-10-CM

## 2013-12-23 DIAGNOSIS — F411 Generalized anxiety disorder: Secondary | ICD-10-CM

## 2013-12-23 LAB — GLUCOSE, POCT (MANUAL RESULT ENTRY): POC GLUCOSE: 208 mg/dL — AB (ref 70–99)

## 2013-12-23 NOTE — Progress Notes (Signed)
Subjective:    Patient ID: Ivan Anderson, male    DOB: [birth year], 73 y.o.   MRN: [id number]  DOS:  12/23/2013 Type of visit - description : f/u ref SSRIs Interval history: Was restarted on fluoxetine at the last visit, good  compliance, no apparent side effects, feeling well.  Diabetes, CBG thi morning was 97, he felt a little  anxious (which is his typical symptom for hypoglycemia), since then he Had breakfast feels better but not 100%.  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    ROS No fever chills Had a URI 6 weeks ago, now asymptomatic No chest pain or difficulty breathing No nausea, vomiting, diarrhea  Past Medical History  Diagnosis Date  . Diabetes mellitus with neuropathy   . Hyperlipidemia   . CAD (coronary artery disease)   . DJD (degenerative joint disease)   . Anxiety   . Erectile dysfunction   . Colon polyp     Cscope at [facility] aprox 2006, repeated 2010 (-), next 5 years  . Eye muscle paralysis     congenital  . Skin cancer     sees derm  . Diaphragm paralysis     R, per pt     Past Surgical History  Procedure Laterality Date  . Carpal tunnel release      B, 2000  . Hip surgery      R, 1964, pins  . Knee surgery       L 2005  . Appendectomy    . Hip surgery      RIGHT--s/p hip replacement, s/p revision August 2009 due to dislocation x 3   . Medial partial knee replacement  08-2012    R  . Hip surgery  04-2012    R hip revision    History   Social History  . Marital Status: Married    Spouse Name: N/A    Number of Children: N/A  . Years of Education: N/A   Occupational History  . realtor    Social History Main Topics  . Smoking status: Former Research scientist (life sciences)  . Smokeless tobacco: Never Used     Comment: quit 1980s  . Alcohol Use: Yes     Comment: rarely   . Drug Use: No  . Sexual  Activity: Not on file   Other Topics Concern  . Not on file   Social History Narrative  . No narrative on file        Medication List       This list is accurate as of: 12/23/13  6:21 PM.  Always use your most recent med list.               azelastine 0.1 % nasal spray  Commonly known as:  ASTELIN  Place 2 sprays into the nose 2 (two) times daily. Use in each nostril as directed     FLUoxetine 20 MG tablet  Commonly known as:  PROZAC  Take 2 tablets (40 mg total) by mouth daily.     LANTUS SOLOSTAR 100 UNIT/ML injection  Generic drug:  insulin glargine  Inject 15 Units into the skin at bedtime.     losartan 50 MG tablet  Commonly known as:  COZAAR  Take 50 mg by mouth daily.     metFORMIN 500 MG tablet  Commonly known as:  GLUCOPHAGE  Take 1,000 mg by mouth daily.     simvastatin 40 MG tablet  Commonly known as:  ZOCOR  Take 80 mg by mouth every evening.           Objective:   Physical Exam BP 124/72  Pulse 70  Temp(Src) 98.2 F (36.8 C) (Oral)  Wt 247 lb 2 oz (112.095 kg)  SpO2 95%  General -- alert, well-developed, NAD.   Neurologic--  MMSE 27. Speech normal, gait appropriate for age, strength symmetric and appropriate for age.  DTRs symmetric. EOMI, PERLA   Psych-- Cognition and judgment appear intact. Cooperative with normal attention span and concentration. No anxious or depressed appearing.       Assessment & Plan:   Today , I spent more than 25   min with the patient: >50% of the time counseling regards question of forgetfulness, role of medications, encourage to keep physically and mentally active

## 2013-12-23 NOTE — Patient Instructions (Signed)
Please come back to the office in 6 months  for a routine check up about your memory , no fasting    Stop by the front desk and schedule the visit

## 2013-12-23 NOTE — Progress Notes (Signed)
Pre visit review using our clinic review tool, if applicable. No additional management support is needed unless otherwise documented below in the visit note. 

## 2013-12-23 NOTE — Assessment & Plan Note (Signed)
Wife has complained to the patient about decreased memory We performed a MMSE his score is 27, he is highly functional. He may be slightly forgetfulnes but his MMSE is essentially normal. At some point medications that will help with memory may include Aricept and Namenda. We agreed on observation for now, reassess in 6 months.

## 2013-12-23 NOTE — Assessment & Plan Note (Signed)
See previous entry, doing well on fluoxetine.

## 2013-12-23 NOTE — Assessment & Plan Note (Signed)
CBG this morning 97, he had breakfast and candy. CBG now is 208 --->  Hypoglycemia resolved.

## 2014-01-14 LAB — CBC AND DIFFERENTIAL
HCT: 39 % — AB (ref 41–53)
HEMOGLOBIN: 13.8 g/dL (ref 13.5–17.5)
WBC: 6.8 10*3/mL

## 2014-03-09 LAB — HM COLONOSCOPY: HM COLON: NORMAL

## 2014-04-19 DIAGNOSIS — M79644 Pain in right finger(s): Secondary | ICD-10-CM | POA: Diagnosis not present

## 2014-04-19 DIAGNOSIS — M720 Palmar fascial fibromatosis [Dupuytren]: Secondary | ICD-10-CM | POA: Diagnosis not present

## 2014-04-29 LAB — HM DIABETES FOOT EXAM

## 2014-06-25 ENCOUNTER — Ambulatory Visit: Payer: Medicare Other | Admitting: Internal Medicine

## 2014-06-28 ENCOUNTER — Ambulatory Visit (INDEPENDENT_AMBULATORY_CARE_PROVIDER_SITE_OTHER): Payer: Medicare Other | Admitting: Internal Medicine

## 2014-06-28 ENCOUNTER — Encounter: Payer: Self-pay | Admitting: Internal Medicine

## 2014-06-28 VITALS — BP 128/64 | HR 68 | Temp 98.0°F | Ht 79.0 in | Wt 244.1 lb

## 2014-06-28 DIAGNOSIS — Z79899 Other long term (current) drug therapy: Secondary | ICD-10-CM

## 2014-06-28 DIAGNOSIS — G3184 Mild cognitive impairment, so stated: Secondary | ICD-10-CM

## 2014-06-28 LAB — SEDIMENTATION RATE: Sed Rate: 10 mm/hr (ref 0–22)

## 2014-06-28 LAB — VITAMIN B12: Vitamin B-12: 346 pg/mL (ref 211–911)

## 2014-06-28 LAB — FOLATE: FOLATE: 19.4 ng/mL (ref >5.9)

## 2014-06-28 MED ORDER — DONEPEZIL HCL 5 MG PO TABS: 5.0000 mg | ORAL_TABLET | Freq: Two times a day (BID) | ORAL | Status: DC

## 2014-06-28 MED ORDER — DONEPEZIL HCL 5 MG PO TABS: 5.0000 mg | ORAL_TABLET | Freq: Every day | ORAL | Status: DC

## 2014-06-28 NOTE — Progress Notes (Signed)
Subjective:    Patient ID: Ivan Anderson, male    DOB: [birth year], 74 y.o.   MRN: [id number]  DOS:  06/28/2014 Type of visit - description : f/u Interval history:  he feels in general well but admits that he is getting more forgetful ,  For instance he forgets certain computer tasks that he knows well.  denies problems with getting lost or forgetting names. He is easily distracted which is something new   Review of Systems  anxiety depression well-controled Denies hedaches, diziness. No nausea or vomitng.   Past Medical History  Diagnosis Date  . Diabetes mellitus with neuropathy   . Hyperlipidemia   . CAD (coronary artery disease)   . DJD (degenerative joint disease)   . Anxiety   . Erectile dysfunction   . Colon polyp     Cscope at [facility] aprox 2006, repeated 2010 (-), next 5 years  . Eye muscle paralysis     congenital  . Skin cancer     sees derm  . Diaphragm paralysis     R, per pt     Past Surgical History  Procedure Laterality Date  . Carpal tunnel release      B, 2000  . Hip surgery      R, 1964, pins  . Knee surgery       L 2005  . Appendectomy    . Hip surgery      RIGHT--s/p hip replacement, s/p revision August 2009 due to dislocation x 3   . Medial partial knee replacement  08-2012    R  . Hip surgery  04-2012    R hip revision    History   Social History  . Marital Status: Married    Spouse Name: N/A  . Number of Children: N/A  . Years of Education: N/A   Occupational History  . realtor    Social History Main Topics  . Smoking status: Former Research scientist (life sciences)  . Smokeless tobacco: Never Used     Comment: quit 1980s  . Alcohol Use: Yes     Comment: rarely   . Drug Use: No  . Sexual Activity: Not on file   Other Topics Concern  . Not on file   Social History Narrative        Medication List       This list is accurate as of: 06/28/14 11:44 AM.  Always use your most recent med list.               azelastine 0.1 % nasal spray  Commonly  known as:  ASTELIN  Place 2 sprays into the nose 2 (two) times daily. Use in each nostril as directed     FLUoxetine 20 MG tablet  Commonly known as:  PROZAC  Take 2 tablets (40 mg total) by mouth daily.     LANTUS SOLOSTAR 100 UNIT/ML injection  Generic drug:  insulin glargine  Inject 20 Units into the skin at bedtime.     losartan 50 MG tablet  Commonly known as:  COZAAR  Take 50 mg by mouth daily.     metFORMIN 500 MG tablet  Commonly known as:  GLUCOPHAGE  Take 1,000 mg by mouth daily.     simvastatin 40 MG tablet  Commonly known as:  ZOCOR  Take 80 mg by mouth every evening.           Objective:   Physical Exam BP 128/64 mmHg  Pulse 68  Temp(Src) 98 F (  36.7 C) (Oral)  Ht 6\' 7"  (2.007 m)  Wt 244 lb 2 oz (110.734 kg)  BMI 27.49 kg/m2  SpO2 97% General:   Well developed, well nourished . NAD.  HEENT:  Normocephalic . Face symmetric, atraumatic Neurologic:  alert & oriented X3.  Speech normal, gait appropriate for age and unassisted Psych--  MMSE 20    Assessment & Plan:

## 2014-06-28 NOTE — Progress Notes (Signed)
Pre visit review using our clinic review tool, if applicable. No additional management support is needed unless otherwise documented below in the visit note. 

## 2014-06-28 NOTE — Patient Instructions (Addendum)
   go to the lab before you leave the office  Start Aricept 5 mg one tablet daily for a month Then 1 tablet   twice a day   next visit 6-8 months

## 2014-06-28 NOTE — Assessment & Plan Note (Addendum)
Follow-up from previous visit, at this time he agrees that he is having some memory issues. MMSE previously  27, today 20. (Mostly because he couldn't subtract from 100,   previously I asked him to spell  "world"  backwards. He also did not recall the 3 items. At this point, he does have memory issues. He is on SSRIs, symptoms well-controlled.  Will check additional labs including sedimentation rate, E31, folic acid and vitamin D.  Will start Aricept, side effects and expectations discussed  refer to neurology, no urgent referral,  for a more detailed exam

## 2014-07-01 ENCOUNTER — Other Ambulatory Visit: Payer: Self-pay

## 2014-07-01 LAB — VITAMIN D 1,25 DIHYDROXY
VITAMIN D 1, 25 (OH) TOTAL: 48 pg/mL (ref 18–72)
Vitamin D2 1, 25 (OH)2: <8 pg/mL
Vitamin D3 1, 25 (OH)2: 48 pg/mL

## 2014-07-01 MED ORDER — FLUOXETINE HCL 20 MG PO TABS: 40.0000 mg | ORAL_TABLET | Freq: Every day | ORAL | Status: DC

## 2014-07-09 ENCOUNTER — Other Ambulatory Visit: Payer: Self-pay

## 2014-07-09 ENCOUNTER — Telehealth: Payer: Self-pay | Admitting: Internal Medicine

## 2014-07-09 MED ORDER — DONEPEZIL HCL 5 MG PO TABS: 5.0000 mg | ORAL_TABLET | Freq: Two times a day (BID) | ORAL | Status: DC

## 2014-07-09 NOTE — Telephone Encounter (Signed)
He has misplaced his prescription for his memory medicine that he is going to take to [name] to be filled.  Could they pick up a copy of that rx today

## 2014-07-09 NOTE — Telephone Encounter (Signed)
Rx printed, awaiting signature by Dr. Paz.  

## 2014-07-09 NOTE — Telephone Encounter (Signed)
LMOM informing Pt that Rx is ready for pick up at front desk.

## 2014-07-15 LAB — BASIC METABOLIC PANEL
BUN: 14 mg/dL (ref 4–21)
Creatinine: 0.6 mg/dL (ref 0.6–1.3)
Glucose: 73 mg/dL
Glucose: 73 mg/dL
Potassium: 4.3 mmol/L (ref 3.4–5.3)
SODIUM: 141 mmol/L (ref 137–147)

## 2014-07-15 LAB — LIPID PANEL
Cholesterol: 145 mg/dL (ref 0–200)
HDL: 60 mg/dL (ref 35–70)
LDL CALC: 77 mg/dL
Triglycerides: 39 mg/dL — AB (ref 40–160)

## 2014-07-15 LAB — HEPATIC FUNCTION PANEL
ALT: 23 U/L (ref 10–40)
AST: 12 U/L — AB (ref 14–40)
Alkaline Phosphatase: 89 U/L (ref 25–125)
Bilirubin, Direct: 0.2 mg/dL (ref 0.01–0.4)
Bilirubin, Total: 0.6 mg/dL

## 2014-07-15 LAB — TSH: TSH: 2.71 u[IU]/mL (ref <=5.90)

## 2014-07-15 LAB — HEMOGLOBIN A1C: Hgb A1c MFr Bld: 6.1 % — AB (ref 4.0–6.0)

## 2014-07-29 ENCOUNTER — Telehealth: Payer: Self-pay | Admitting: Neurology

## 2014-07-29 NOTE — Telephone Encounter (Signed)
Pt canceled app to see Dr Delice Lesch on 08-04-14 due to him going to [name] to have it done notified Dr Larose Kells office by epic

## 2014-08-04 ENCOUNTER — Ambulatory Visit: Payer: Medicare Other | Admitting: Neurology

## 2014-08-10 DIAGNOSIS — M1612 Unilateral primary osteoarthritis, left hip: Secondary | ICD-10-CM | POA: Diagnosis not present

## 2014-08-10 DIAGNOSIS — Z96651 Presence of right artificial knee joint: Secondary | ICD-10-CM | POA: Diagnosis not present

## 2014-08-10 DIAGNOSIS — Z471 Aftercare following joint replacement surgery: Secondary | ICD-10-CM | POA: Diagnosis not present

## 2014-08-10 DIAGNOSIS — Z96641 Presence of right artificial hip joint: Secondary | ICD-10-CM | POA: Diagnosis not present

## 2014-08-10 DIAGNOSIS — Z96653 Presence of artificial knee joint, bilateral: Secondary | ICD-10-CM | POA: Diagnosis not present

## 2014-08-10 DIAGNOSIS — Z96652 Presence of left artificial knee joint: Secondary | ICD-10-CM | POA: Diagnosis not present

## 2014-08-24 DIAGNOSIS — Z794 Long term (current) use of insulin: Secondary | ICD-10-CM | POA: Diagnosis not present

## 2014-08-24 DIAGNOSIS — E119 Type 2 diabetes mellitus without complications: Secondary | ICD-10-CM | POA: Diagnosis not present

## 2014-08-24 DIAGNOSIS — M25552 Pain in left hip: Secondary | ICD-10-CM | POA: Diagnosis not present

## 2014-09-10 ENCOUNTER — Emergency Department (HOSPITAL_BASED_OUTPATIENT_CLINIC_OR_DEPARTMENT_OTHER)
Admission: EM | Admit: 2014-09-10 | Discharge: 2014-09-10 | Disposition: A | Discharge status: Home / Self Care | Payer: Medicare Other | Attending: Emergency Medicine | Admitting: Emergency Medicine

## 2014-09-10 ENCOUNTER — Encounter (HOSPITAL_BASED_OUTPATIENT_CLINIC_OR_DEPARTMENT_OTHER): Payer: Self-pay | Admitting: *Deleted

## 2014-09-10 DIAGNOSIS — Z87891 Personal history of nicotine dependence: Secondary | ICD-10-CM | POA: Diagnosis not present

## 2014-09-10 DIAGNOSIS — Z8709 Personal history of other diseases of the respiratory system: Secondary | ICD-10-CM | POA: Insufficient documentation

## 2014-09-10 DIAGNOSIS — Z87448 Personal history of other diseases of urinary system: Secondary | ICD-10-CM | POA: Diagnosis not present

## 2014-09-10 DIAGNOSIS — E1140 Type 2 diabetes mellitus with diabetic neuropathy, unspecified: Secondary | ICD-10-CM | POA: Diagnosis not present

## 2014-09-10 DIAGNOSIS — Z85828 Personal history of other malignant neoplasm of skin: Secondary | ICD-10-CM | POA: Insufficient documentation

## 2014-09-10 DIAGNOSIS — Z86010 Personal history of colonic polyps: Secondary | ICD-10-CM | POA: Insufficient documentation

## 2014-09-10 DIAGNOSIS — R350 Frequency of micturition: Secondary | ICD-10-CM | POA: Diagnosis not present

## 2014-09-10 DIAGNOSIS — Z8669 Personal history of other diseases of the nervous system and sense organs: Secondary | ICD-10-CM | POA: Diagnosis not present

## 2014-09-10 DIAGNOSIS — I2510 Atherosclerotic heart disease of native coronary artery without angina pectoris: Secondary | ICD-10-CM | POA: Insufficient documentation

## 2014-09-10 DIAGNOSIS — F419 Anxiety disorder, unspecified: Secondary | ICD-10-CM | POA: Diagnosis not present

## 2014-09-10 DIAGNOSIS — Z79899 Other long term (current) drug therapy: Secondary | ICD-10-CM | POA: Diagnosis not present

## 2014-09-10 DIAGNOSIS — M79605 Pain in left leg: Secondary | ICD-10-CM | POA: Insufficient documentation

## 2014-09-10 DIAGNOSIS — Z794 Long term (current) use of insulin: Secondary | ICD-10-CM | POA: Insufficient documentation

## 2014-09-10 DIAGNOSIS — R252 Cramp and spasm: Secondary | ICD-10-CM

## 2014-09-10 DIAGNOSIS — E785 Hyperlipidemia, unspecified: Secondary | ICD-10-CM | POA: Diagnosis not present

## 2014-09-10 LAB — URINALYSIS, ROUTINE W REFLEX MICROSCOPIC
Bilirubin Urine: NEGATIVE
Glucose, UA: NEGATIVE mg/dL
Hgb urine dipstick: NEGATIVE
KETONES UR: NEGATIVE mg/dL
Leukocytes, UA: NEGATIVE
Nitrite: NEGATIVE
PH: 6 (ref 5.0–8.0)
PROTEIN: NEGATIVE mg/dL
Specific Gravity, Urine: 1.015 (ref 1.005–1.030)
Urobilinogen, UA: 0.2 mg/dL (ref 0.0–1.0)

## 2014-09-10 LAB — CBC
HEMATOCRIT: 42.3 % (ref 39.0–52.0)
Hemoglobin: 14.4 g/dL (ref 13.0–17.0)
MCH: 31.1 pg (ref 26.0–34.0)
MCHC: 34 g/dL (ref 30.0–36.0)
MCV: 91.4 fL (ref 78.0–100.0)
Platelets: 177 10*3/uL (ref 150–400)
RBC: 4.63 MIL/uL (ref 4.22–5.81)
RDW: 13.4 % (ref 11.5–15.5)
WBC: 7 10*3/uL (ref 4.0–10.5)

## 2014-09-10 LAB — BASIC METABOLIC PANEL
Anion gap: 11 (ref 5–15)
BUN: 15 mg/dL (ref 6–20)
CO2: 27 mmol/L (ref 22–32)
CREATININE: 0.71 mg/dL (ref 0.61–1.24)
Calcium: 9.4 mg/dL (ref 8.9–10.3)
Chloride: 103 mmol/L (ref 101–111)
GFR calc Af Amer: >60 mL/min (ref >60)
GFR calc non Af Amer: >60 mL/min (ref >60)
GLUCOSE: 165 mg/dL — AB (ref 65–99)
Potassium: 4.3 mmol/L (ref 3.5–5.1)
Sodium: 141 mmol/L (ref 135–145)

## 2014-09-10 LAB — CBG MONITORING, ED: Glucose-Capillary: 186 mg/dL — ABNORMAL HIGH (ref 65–99)

## 2014-09-10 NOTE — Discharge Instructions (Signed)
Return to the ED with any concerns including weakness of arms or legs, fainting, chest pain, difficulty breathing, vomiting and not able to keep down liquids, decreased level of alertness/lethargy, or any other alarming symptoms

## 2014-09-10 NOTE — ED Provider Notes (Signed)
CSN: [id number]     Arrival date & time 09/10/14  0827 History   First MD Initiated Contact with Patient 09/10/14 [phone]     Chief Complaint  Patient presents with  . Leg Pain     (Consider location/radiation/quality/duration/timing/severity/associated sxs/prior Treatment) HPI  Pt presents with c/o feeling shaky this morning when he woke up- he was concerned his blood sugar was low, it was 83.  He also c/o leg cramps last night and frequent urination.  Wife states he has lost 11 pounds in the last month and she is concerned as cancer runs in their family.  She states she would like him to be tested for cancer from head to toe.  No shortness of breath, no chest pain, no fainting.  He had some memory testing last week and felt very tired after doing all the tests.  His primary care is Dr. Larose Kells here at med center.  There are no other associated systemic symptoms, there are no other alleviating or modifying factors.   Past Medical History  Diagnosis Date  . Diabetes mellitus with neuropathy   . Hyperlipidemia   . CAD (coronary artery disease)   . DJD (degenerative joint disease)   . Anxiety   . Erectile dysfunction   . Colon polyp     Cscope at [facility] aprox 2006, repeated 2010 (-), next 5 years  . Eye muscle paralysis     congenital  . Skin cancer     sees derm  . Diaphragm paralysis     R, per pt    Past Surgical History  Procedure Laterality Date  . Carpal tunnel release      B, 2000  . Hip surgery      R, 1964, pins  . Knee surgery       L 2005  . Appendectomy    . Hip surgery      RIGHT--s/p hip replacement, s/p revision August 2009 due to dislocation x 3   . Medial partial knee replacement  08-2012    R  . Hip surgery  04-2012    R hip revision   History reviewed. No pertinent family history. History  Substance Use Topics  . Smoking status: Former Research scientist (life sciences)  . Smokeless tobacco: Never Used     Comment: quit 1980s  . Alcohol Use: Yes     Comment: rarely     Review of  Systems  ROS reviewed and all otherwise negative except for mentioned in HPI    Allergies  Review of patient's allergies indicates no known allergies.  Home Medications   Prior to Admission medications   Medication Sig Start Date End Date Taking? Authorizing Provider  azelastine (ASTELIN) 137 MCG/SPRAY nasal spray Place 2 sprays into the nose 2 (two) times daily. Use in each nostril as directed 05/20/12   Colon Branch, MD  donepezil (ARICEPT) 5 MG tablet Take 1 tablet (5 mg total) by mouth 2 (two) times daily. 07/09/14   Colon Branch, MD  FLUoxetine (PROZAC) 20 MG tablet Take 2 tablets (40 mg total) by mouth daily. 07/01/14   Colon Branch, MD  insulin glargine (LANTUS SOLOSTAR) 100 UNIT/ML injection Inject 20 Units into the skin at bedtime.     Historical Provider, MD  losartan (COZAAR) 50 MG tablet Take 50 mg by mouth daily.    Historical Provider, MD  metFORMIN (GLUCOPHAGE) 500 MG tablet Take 1,000 mg by mouth daily.     Historical Provider, MD  simvastatin (ZOCOR) 40  MG tablet Take 80 mg by mouth every evening.    Historical Provider, MD   BP 112/66 mmHg  Pulse 69  Temp(Src) 98 F (36.7 C) (Oral)  Resp 18  Ht 6' 6.5" (1.994 m)  Wt 229 lb 5 oz (104.015 kg)  BMI 26.16 kg/m2  SpO2 98%  Vitals reviewed Physical Exam  Physical Examination: General appearance - alert, well appearing, and in no distress Mental status - alert, oriented to person, place, and time Eyes - PERRL, no conjunctival injection no scleral icterus Mouth - mucous membranes moist, pharynx normal without lesions Chest - clear to auscultation, no wheezes, rales or rhonchi, symmetric air entry Heart - normal rate, regular rhythm, normal S1, S2, no murmurs, rubs, clicks or gallops Abdomen - soft, nontender, nondistended, no masses or organomegaly Neurological - alert, oriented x 3, cranial nerves grossly intact, no focal deficit Extremities - peripheral pulses normal, no pedal edema, no clubbing or cyanosis Skin - normal  coloration and turgor, no rashes  ED Course  Procedures (including critical care time) Labs Review Labs Reviewed  BASIC METABOLIC PANEL - Abnormal; Notable for the following:    Glucose, Bld 165 (*)    All other components within normal limits  CBG MONITORING, ED - Abnormal; Notable for the following:    Glucose-Capillary 186 (*)    All other components within normal limits  CBC  URINALYSIS, ROUTINE W REFLEX MICROSCOPIC (NOT AT [facility])    Imaging Review No results found.   EKG Interpretation None      MDM   Final diagnoses:  Cramps of left lower extremity  Urinary frequency    Pt presenting with c/o leg cramps, urinary frequency, labs and urine in the ED were reassuring, no electrolyte abnormality. Normal exam without cramping or tremor in the ED.  Wife is concerned that patient may have cancer and has requested that he be checked head to toe for cancer.  I have explained twice the reasoning behind the ED workup that was performed and the importance of follow up with PMD if further testing is needed.  Discharged with strict return precautions.  Pt agreeable with plan.    Alfonzo Beers, MD 09/12/14 1045

## 2014-09-10 NOTE — ED Notes (Signed)
Pt amb to room 2 with quick steady gait, laughing in nad. Pt reports his blood sugar was 83 this morning and it has never been that low. Pt states "I felt fine, but I've never seen the number be that low, it scared me..." pt states his fsbs was 141 last night. Pt states he ate a chocolate cookie and some orange candies after checking his blood sugar. Pt denies any c/o at this time.

## 2014-12-02 ENCOUNTER — Telehealth: Payer: Self-pay | Admitting: *Deleted

## 2014-12-02 NOTE — Telephone Encounter (Signed)
Medical records received via mail from [name]. Forwarded to [employer]. JG//CMA

## 2014-12-17 ENCOUNTER — Ambulatory Visit (INDEPENDENT_AMBULATORY_CARE_PROVIDER_SITE_OTHER): Payer: Medicare Other

## 2014-12-17 VITALS — BP 122/56 | HR 56 | Temp 98.2°F | Ht 78.5 in | Wt 235.0 lb

## 2014-12-17 DIAGNOSIS — Z23 Encounter for immunization: Secondary | ICD-10-CM

## 2014-12-17 DIAGNOSIS — Z0000 Encounter for general adult medical examination without abnormal findings: Secondary | ICD-10-CM

## 2014-12-17 NOTE — Progress Notes (Addendum)
Subjective:   Ivan Anderson is a 74 y.o. male who presents for Medicare Annual/Subsequent preventive examination.  Review of Systems:  NO Ros Sleep patterns:  Sleeps at least 8 hours/wakes up at least 2 times   Home Safety/Smoke Alarms:  Feels safe at home.  Lives at home with wife and [place] terrier dog.  Smoke alarms.   Firearm Safety: Keeps firearms in safe place. Sun Exposure:  Discussed.  Seat Belt Safety/Bike Helmet:  Always wears seat belt.    Counseling:   Eye Exam- 1 year ago at [place].   Dental- Goes regularly  Male:  CCS-03/09/14, repeat in 5 yrs   PSA-02/08/09   Objective:    Vitals: BP 122/56 mmHg  Pulse 56  Temp(Src) 98.2 F (36.8 C) (Oral)  Ht 6' 6.5" (1.994 m)  Wt 235 lb (106.595 kg)  BMI 26.81 kg/m2  SpO2 98%  Tobacco History  Smoking status  . Former Smoker  Smokeless tobacco  . Never Used    Comment: quit 1980s     Counseling given: Not Answered   Past Medical History  Diagnosis Date  . Diabetes mellitus with neuropathy   . Hyperlipidemia   . CAD (coronary artery disease)   . DJD (degenerative joint disease)   . Anxiety   . Erectile dysfunction   . Colon polyp     Cscope at [facility] aprox 2006, repeated 2010 (-), next 5 years  . Eye muscle paralysis     congenital  . Skin cancer     sees derm  . Diaphragm paralysis     R, per pt   . Mild cognitive disorder     Unspecified mild NCD   Past Surgical History  Procedure Laterality Date  . Carpal tunnel release      B, 2000  . Hip surgery      R, 1964, pins  . Knee surgery       L 2005  . Appendectomy    . Hip surgery      RIGHT--s/p hip replacement, s/p revision August 2009 due to dislocation x 3   . Medial partial knee replacement  08-2012    R  . Hip surgery  04-2012    R hip revision  . [facility]oscopy  [facility]   History reviewed. No pertinent family history. History  Sexual Activity  . Sexual Activity: Not on file    Outpatient Encounter Prescriptions as of  12/17/2014  Medication Sig  . azelastine (ASTELIN) 137 MCG/SPRAY nasal spray Place 2 sprays into the nose 2 (two) times daily. Use in each nostril as directed  . CINNAMON PO Take 2 capsules by mouth 2 (two) times daily.  Marland Kitchen FLUoxetine (PROZAC) 20 MG tablet Take 2 tablets (40 mg total) by mouth daily.  . furosemide (LASIX) 20 MG tablet Take 1 tablet (20 mg total) by mouth daily.  . insulin glargine (LANTUS SOLOSTAR) 100 UNIT/ML injection Inject 20 Units into the skin at bedtime.   Marland Kitchen loratadine (CLARITIN) 10 MG tablet Take 1 tablet (10 mg total) by mouth daily.  Marland Kitchen losartan (COZAAR) 100 MG tablet Take 1 tablet (100 mg total) by mouth daily.  . metFORMIN (GLUCOPHAGE) 1000 MG tablet Take 1 tablet (1,000 mg total) by mouth 2 (two) times daily with a meal.  . omeprazole (PRILOSEC) 20 MG capsule Take 1 capsule (20 mg total) by mouth daily.  . simvastatin (ZOCOR) 40 MG tablet Take 0.5 tablets (20 mg total) by  mouth at bedtime.  . donepezil (ARICEPT) 5 MG tablet Take 1 tablet (5 mg total) by mouth daily.   No facility-administered encounter medications on file as of 12/17/2014.    Activities of Daily Living In your present state of health, do you have any difficulty performing the following activities: 12/17/2014  Hearing? N  Vision? N  Difficulty concentrating or making decisions? N  Walking or climbing stairs? N  Dressing or bathing? N  Doing errands, shopping? N  Preparing Food and eating ? N  Using the Toilet? N  In the past six months, have you accidently leaked urine? N  Do you have problems with loss of bowel control? N  Managing your Medications? N  Managing your Finances? N  Housekeeping or managing your Housekeeping? N    Patient Care Team: Colon Branch, MD as PCP - General   Providers at [facility] Dr Bunnie Domino and [place] Pulling  Assessment:  Hypertension- Stable. Well-controlled with meds.  Encouraged heart healthy diet and exercise. Continue to take medications as prescribed. Advised to follow up with  Dr. Larose Kells.   Hyperlipidemia- On simvastatin.  Last lipid panel-07/15/14.  Encouraged heart healthy diet and exercise. Continue taking med as prescribed. Advised to follow up with Dr. Larose Kells.  Type II DM- Stable.  Being controlled with Metformin and Lantus.  Last A1c: 6.1.  Home blood sugars: 119-200s; states generally blood sugars are <140.  Advised to follow up with Dr. Larose Kells.    Exercise Activities and Dietary recommendations Current Exercise Habits:: Home exercise routine, Type of exercise: walking (yardwork), Time (Minutes): 30, Frequency (Times/Week): 3, Weekly Exercise (Minutes/Week): 90   Diet- Eats 2-3 meals per day.  Eats relatively healthy.    Goals    . Increase physical activity      Fall Risk Fall Risk  12/17/2014 09/22/2013  Falls in the past year? Yes No  Number falls in past yr: 1 -  Injury with Fall? No -  Follow up Falls prevention discussed -   Depression Screen PHQ 2/9 Scores 12/17/2014 09/22/2013  PHQ - 2 Score 0 0    Cognitive Testing MMSE - Mini Mental State Exam 12/17/2014  Orientation to time 5  Orientation to Place 5  Registration 3  Attention/ Calculation 4  Recall 2  Language- name 2 objects 2  Language- repeat 1  Language- follow 3 step command 3  Language- read & follow direction 1  Write a sentence 1  Copy design 1  Total score 28    Immunization History  Administered Date(s) Administered  . Influenza Split 02/07/2011, 12/26/2011  . Influenza Whole 04/29/2007, 01/05/2009, 12/21/2009  . Influenza, High Dose Seasonal PF 12/17/2014  . Influenza,inj,Quad PF,36+ Mos 12/23/2013  . Influenza-Unspecified 01/07/2013  . Pneumococcal Conjugate-13 05/10/2012  . Pneumococcal Polysaccharide-23 04/09/2000  . Pneumococcal-Unspecified 09/15/1999  . Td 04/09/2001, 05/10/2012  . Tdap 05/15/2006  . Zoster 10/07/2009   Screening Tests Health Maintenance  Topic Date Due  . OPHTHALMOLOGY EXAM  06/16/1950  . PNA vac Low Risk Adult (2 of 2 - PPSV23) 05/10/2013  .  INFLUENZA VACCINE  11/08/2014  . HEMOGLOBIN A1C  01/14/2015  . FOOT EXAM  04/30/2015  . URINE MICROALBUMIN  07/15/2015  . TETANUS/TDAP  05/10/2022  . COLONOSCOPY  03/09/2024  . ZOSTAVAX  Completed    Plan:  Follow up with Dr. Larose Kells as scheduled.    Increase physical activity (walking and bicycling) and eat a healthy diet. Avoid saturated fats and simple sugars.    During the  course of the visit the patient was educated and counseled about the following appropriate screening and preventive services:   Vaccines to include Pneumoccal, Influenza, Hepatitis B, Td, Zostavax, HCV  Electrocardiogram  Cardiovascular Disease  Colorectal cancer screening  Diabetes screening  Prostate Cancer Screening  Glaucoma screening  Nutrition counseling   Smoking cessation counseling  Patient Instructions (the written plan) was given to the patient.    Rudene Anda, RN  12/17/2014   Reviewed, agree French Ana MD 12/22/2014

## 2014-12-17 NOTE — Progress Notes (Signed)
Pre visit review using our clinic review tool, if applicable. No additional management support is needed unless otherwise documented below in the visit note. 

## 2014-12-17 NOTE — Patient Instructions (Addendum)
Follow up with Dr. Larose Kells as scheduled.    Increase physical activity (walking and bicycling) and eat a healthy diet. Avoid saturated fats and simple sugars.     Fat and Cholesterol Control Diet Fat and cholesterol levels in your blood and organs are influenced by your diet. High levels of fat and cholesterol may lead to diseases of the heart, small and large blood vessels, gallbladder, liver, and pancreas. CONTROLLING FAT AND CHOLESTEROL WITH DIET Although exercise and lifestyle factors are important, your diet is key. That is because certain foods are known to raise cholesterol and others to lower it. The goal is to balance foods for their effect on cholesterol and more importantly, to replace saturated and trans fat with other types of fat, such as monounsaturated fat, polyunsaturated fat, and omega-3 fatty acids. On average, a person should consume no more than 15 to 17 g of saturated fat daily. Saturated and trans fats are considered "bad" fats, and they will raise LDL cholesterol. Saturated fats are primarily found in animal products such as meats, butter, and cream. However, that does not mean you need to give up all your favorite foods. Today, there are good tasting, low-fat, low-cholesterol substitutes for most of the things you like to eat. Choose low-fat or nonfat alternatives. Choose round or loin cuts of red meat. These types of cuts are lowest in fat and cholesterol. Chicken (without the skin), fish, veal, and ground [place] breast are great choices. Eliminate fatty meats, such as hot dogs and salami. Even shellfish have little or no saturated fat. Have a 3 oz (85 g) portion when you eat lean meat, poultry, or fish. Trans fats are also called "partially hydrogenated oils." They are oils that have been scientifically manipulated so that they are solid at room temperature resulting in a longer shelf life and improved taste and texture of foods in which they are added. Trans fats are found in stick  margarine, some tub margarines, cookies, crackers, and baked goods.  When baking and cooking, oils are a great substitute for butter. The monounsaturated oils are especially beneficial since it is believed they lower LDL and raise HDL. The oils you should avoid entirely are saturated tropical oils, such as coconut and palm.  Remember to eat a lot from food groups that are naturally free of saturated and trans fat, including fish, fruit, vegetables, beans, grains (barley, rice, couscous, bulgur wheat), and pasta (without cream sauces).  IDENTIFYING FOODS THAT LOWER FAT AND CHOLESTEROL  Soluble fiber may lower your cholesterol. This type of fiber is found in fruits such as apples, vegetables such as broccoli, potatoes, and carrots, legumes such as beans, peas, and lentils, and grains such as barley. Foods fortified with plant sterols (phytosterol) may also lower cholesterol. You should eat at least 2 g per day of these foods for a cholesterol lowering effect.  Read package labels to identify low-saturated fats, trans fat free, and low-fat foods at the supermarket. Select cheeses that have only 2 to 3 g saturated fat per ounce. Use a heart-healthy tub margarine that is free of trans fats or partially hydrogenated oil. When buying baked goods (cookies, crackers), avoid partially hydrogenated oils. Breads and muffins should be made from whole grains (whole-wheat or whole oat flour, instead of "flour" or "enriched flour"). Buy non-creamy canned soups with reduced salt and no added fats.  FOOD PREPARATION TECHNIQUES  Never deep-fry. If you must fry, either stir-fry, which uses very little fat, or use non-stick cooking sprays. When  possible, broil, bake, or roast meats, and steam vegetables. Instead of putting butter or margarine on vegetables, use lemon and herbs, applesauce, and cinnamon (for squash and sweet potatoes). Use nonfat yogurt, salsa, and low-fat dressings for salads.  LOW-SATURATED FAT / LOW-FAT FOOD  SUBSTITUTES Meats / Saturated Fat (g)  Avoid: Steak, marbled (3 oz/85 g) / 11 g  Choose: Steak, lean (3 oz/85 g) / 4 g  Avoid: Hamburger (3 oz/85 g) / 7 g  Choose: Hamburger, lean (3 oz/85 g) / 5 g  Avoid: Ham (3 oz/85 g) / 6 g  Choose: Ham, lean cut (3 oz/85 g) / 2.4 g  Avoid: Chicken, with skin, dark meat (3 oz/85 g) / 4 g  Choose: Chicken, skin removed, dark meat (3 oz/85 g) / 2 g  Avoid: Chicken, with skin, light meat (3 oz/85 g) / 2.5 g  Choose: Chicken, skin removed, light meat (3 oz/85 g) / 1 g Dairy / Saturated Fat (g)  Avoid: Whole milk (1 cup) / 5 g  Choose: Low-fat milk, 2% (1 cup) / 3 g  Choose: Low-fat milk, 1% (1 cup) / 1.5 g  Choose: Skim milk (1 cup) / 0.3 g  Avoid: Hard cheese (1 oz/28 g) / 6 g  Choose: Skim milk cheese (1 oz/28 g) / 2 to 3 g  Avoid: Cottage cheese, 4% fat (1 cup) / 6.5 g  Choose: Low-fat cottage cheese, 1% fat (1 cup) / 1.5 g  Avoid: Ice cream (1 cup) / 9 g  Choose: Sherbet (1 cup) / 2.5 g  Choose: Nonfat frozen yogurt (1 cup) / 0.3 g  Choose: Frozen fruit bar / trace  Avoid: Whipped cream (1 tbs) / 3.5 g  Choose: Nondairy whipped topping (1 tbs) / 1 g Condiments / Saturated Fat (g)  Avoid: Mayonnaise (1 tbs) / 2 g  Choose: Low-fat mayonnaise (1 tbs) / 1 g  Avoid: Butter (1 tbs) / 7 g  Choose: Extra light margarine (1 tbs) / 1 g  Avoid: Coconut oil (1 tbs) / 11.8 g  Choose: Olive oil (1 tbs) / 1.8 g  Choose: Corn oil (1 tbs) / 1.7 g  Choose: Safflower oil (1 tbs) / 1.2 g  Choose: Sunflower oil (1 tbs) / 1.4 g  Choose: Soybean oil (1 tbs) / 2.4 g  Choose: Canola oil (1 tbs) / 1 g Document Released: 03/26/2005 Document Revised: 07/21/2012 Document Reviewed: 06/24/2013 [employer] Patient Information 2015 [place], [place]. This information is not intended to replace advice given to you by your health care provider. Make sure you discuss any questions you have with your health care provider.  Health Maintenance A  healthy lifestyle and preventative care can promote health and wellness.  Maintain regular health, dental, and eye exams.  Eat a healthy diet. Foods like vegetables, fruits, whole grains, low-fat dairy products, and lean protein foods contain the nutrients you need and are low in calories. Decrease your intake of foods high in solid fats, added sugars, and salt. Get information about a proper diet from your health care provider, if necessary.  Regular physical exercise is one of the most important things you can do for your health. Most adults should get at least 150 minutes of moderate-intensity exercise (any activity that increases your heart rate and causes you to sweat) each week. In addition, most adults need muscle-strengthening exercises on 2 or more days a week.   Maintain a healthy weight. The body mass index (BMI) is a screening tool to  identify possible weight problems. It provides an estimate of body fat based on height and weight. Your health care provider can find your BMI and can help you achieve or maintain a healthy weight. For males 20 years and older:  A BMI below 18.5 is considered underweight.  A BMI of 18.5 to 24.9 is normal.  A BMI of 25 to 29.9 is considered overweight.  A BMI of 30 and above is considered obese.  Maintain normal blood lipids and cholesterol by exercising and minimizing your intake of saturated fat. Eat a balanced diet with plenty of fruits and vegetables. Blood tests for lipids and cholesterol should begin at age 90 and be repeated every 5 years. If your lipid or cholesterol levels are high, you are over age 36, or you are at high risk for heart disease, you may need your cholesterol levels checked more frequently.Ongoing high lipid and cholesterol levels should be treated with medicines if diet and exercise are not working.  If you smoke, find out from your health care provider how to quit. If you do not use tobacco, do not start.  Lung cancer  screening is recommended for adults aged 41-80 years who are at high risk for developing lung cancer because of a history of smoking. A yearly low-dose CT scan of the lungs is recommended for people who have at least a 30-pack-year history of smoking and are current smokers or have quit within the past 15 years. A pack year of smoking is smoking an average of 1 pack of cigarettes a day for 1 year (for example, a 30-pack-year history of smoking could mean smoking 1 pack a day for 30 years or 2 packs a day for 15 years). Yearly screening should continue until the smoker has stopped smoking for at least 15 years. Yearly screening should be stopped for people who develop a health problem that would prevent them from having lung cancer treatment.  If you choose to drink alcohol, do not have more than 2 drinks per day. One drink is considered to be 12 oz (360 mL) of beer, 5 oz (150 mL) of wine, or 1.5 oz (45 mL) of liquor.  Avoid the use of street drugs. Do not share needles with anyone. Ask for help if you need support or instructions about stopping the use of drugs.  High blood pressure causes heart disease and increases the risk of stroke. Blood pressure should be checked at least every 1-2 years. Ongoing high blood pressure should be treated with medicines if weight loss and exercise are not effective.  If you are 12-66 years old, ask your health care provider if you should take aspirin to prevent heart disease.  Diabetes screening involves taking a blood sample to check your fasting blood sugar level. This should be done once every 3 years after age 65 if you are at a normal weight and without risk factors for diabetes. Testing should be considered at a younger age or be carried out more frequently if you are overweight and have at least 1 risk factor for diabetes.  Colorectal cancer can be detected and often prevented. Most routine colorectal cancer screening begins at the age of 59 and continues through  age 51. However, your health care provider may recommend screening at an earlier age if you have risk factors for colon cancer. On a yearly basis, your health care provider may provide home test kits to check for hidden blood in the stool. A small camera at the end  of a tube may be used to directly examine the colon (sigmoidoscopy or colonoscopy) to detect the earliest forms of colorectal cancer. Talk to your health care provider about this at age 54 when routine screening begins. A direct exam of the colon should be repeated every 5-10 years through age 89, unless early forms of precancerous polyps or small growths are found.  People who are at an increased risk for hepatitis B should be screened for this virus. You are considered at high risk for hepatitis B if:  You were born in a country where hepatitis B occurs often. Talk with your health care provider about which countries are considered high risk.  Your parents were born in a high-risk country and you have not received a shot to protect against hepatitis B (hepatitis B vaccine).  You have HIV or AIDS.  You use needles to inject street drugs.  You live with, or have sex with, someone who has hepatitis B.  You are a man who has sex with other men (MSM).  You get hemodialysis treatment.  You take certain medicines for conditions like cancer, organ transplantation, and autoimmune conditions.  Hepatitis C blood testing is recommended for all people born from [birth year] through 1967 and any individual with known risk factors for hepatitis C.  Healthy men should no longer receive prostate-specific antigen (PSA) blood tests as part of routine cancer screening. Talk to your health care provider about prostate cancer screening.  Testicular cancer screening is not recommended for adolescents or adult males who have no symptoms. Screening includes self-exam, a health care provider exam, and other screening tests. Consult with your health care provider  about any symptoms you have or any concerns you have about testicular cancer.  Practice safe sex. Use condoms and avoid high-risk sexual practices to reduce the spread of sexually transmitted infections (STIs).  You should be screened for STIs, including gonorrhea and chlamydia if:  You are sexually active and are younger than 24 years.  You are older than 24 years, and your health care provider tells you that you are at risk for this type of infection.  Your sexual activity has changed since you were last screened, and you are at an increased risk for chlamydia or gonorrhea. Ask your health care provider if you are at risk.  If you are at risk of being infected with HIV, it is recommended that you take a prescription medicine daily to prevent HIV infection. This is called pre-exposure prophylaxis (PrEP). You are considered at risk if:  You are a man who has sex with other men (MSM).  You are a heterosexual man who is sexually active with multiple partners.  You take drugs by injection.  You are sexually active with a partner who has HIV.  Talk with your health care provider about whether you are at high risk of being infected with HIV. If you choose to begin PrEP, you should first be tested for HIV. You should then be tested every 3 months for as long as you are taking PrEP.  Use sunscreen. Apply sunscreen liberally and repeatedly throughout the day. You should seek shade when your shadow is shorter than you. Protect yourself by wearing long sleeves, pants, a wide-brimmed hat, and sunglasses year round whenever you are outdoors.  Tell your health care provider of new moles or changes in moles, especially if there is a change in shape or color. Also, tell your health care provider if a mole is larger than the  size of a pencil eraser.  A one-time screening for abdominal aortic aneurysm (AAA) and surgical repair of large AAAs by ultrasound is recommended for men aged 58-75 years who are  current or former smokers.  Stay current with your vaccines (immunizations). Document Released: 09/22/2007 Document Revised: 03/31/2013 Document Reviewed: 08/21/2010 [facility] Patient Information 2015 [place], [place]. This information is not intended to replace advice given to you by your health care provider. Make sure you discuss any questions you have with your health care provider.

## 2015-01-11 DIAGNOSIS — Z794 Long term (current) use of insulin: Secondary | ICD-10-CM | POA: Diagnosis not present

## 2015-01-11 DIAGNOSIS — I10 Essential (primary) hypertension: Secondary | ICD-10-CM | POA: Diagnosis not present

## 2015-01-11 DIAGNOSIS — Z96641 Presence of right artificial hip joint: Secondary | ICD-10-CM | POA: Diagnosis not present

## 2015-01-11 DIAGNOSIS — E119 Type 2 diabetes mellitus without complications: Secondary | ICD-10-CM | POA: Diagnosis not present

## 2015-01-11 DIAGNOSIS — Z471 Aftercare following joint replacement surgery: Secondary | ICD-10-CM | POA: Diagnosis not present

## 2015-01-11 DIAGNOSIS — Z96653 Presence of artificial knee joint, bilateral: Secondary | ICD-10-CM | POA: Diagnosis not present

## 2015-01-11 DIAGNOSIS — M1612 Unilateral primary osteoarthritis, left hip: Secondary | ICD-10-CM | POA: Diagnosis not present

## 2015-01-11 DIAGNOSIS — Z96652 Presence of left artificial knee joint: Secondary | ICD-10-CM | POA: Diagnosis not present

## 2015-01-11 DIAGNOSIS — Z79899 Other long term (current) drug therapy: Secondary | ICD-10-CM | POA: Diagnosis not present

## 2015-02-10 NOTE — Telephone Encounter (Signed)
About  100 pages from [name] reviewed, mostly  repetitive or irrelevant information. Most relevant will be kept and enter into our system. 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 April 2016: TSH 2.7, LFTs normal, microalbumin negative, B12 437 normal. A1c 6.1. LDL 77. Triglycerides 39. Had a Holter monitor for palpitations on August 2016: Brief atrial tachycardia and a single 4 beat run of NSVT asymptomatic. sx did not correspond to abnormalities Saw dermatology, diagnosed with seborrheic keratosis, actinic keratosis (cryotherapy)

## 2015-03-27 ENCOUNTER — Emergency Department (HOSPITAL_COMMUNITY)
Admission: EM | Admit: 2015-03-27 | Discharge: 2015-03-27 | Disposition: A | Discharge status: Home / Self Care | Payer: Medicare Other | Attending: Emergency Medicine | Admitting: Emergency Medicine

## 2015-03-27 ENCOUNTER — Encounter (HOSPITAL_COMMUNITY): Payer: Self-pay | Admitting: Emergency Medicine

## 2015-03-27 DIAGNOSIS — Z87438 Personal history of other diseases of male genital organs: Secondary | ICD-10-CM | POA: Insufficient documentation

## 2015-03-27 DIAGNOSIS — I2510 Atherosclerotic heart disease of native coronary artery without angina pectoris: Secondary | ICD-10-CM | POA: Insufficient documentation

## 2015-03-27 DIAGNOSIS — E785 Hyperlipidemia, unspecified: Secondary | ICD-10-CM | POA: Diagnosis not present

## 2015-03-27 DIAGNOSIS — R002 Palpitations: Secondary | ICD-10-CM | POA: Diagnosis present

## 2015-03-27 DIAGNOSIS — I4891 Unspecified atrial fibrillation: Secondary | ICD-10-CM | POA: Diagnosis not present

## 2015-03-27 DIAGNOSIS — F419 Anxiety disorder, unspecified: Secondary | ICD-10-CM | POA: Diagnosis not present

## 2015-03-27 DIAGNOSIS — Z87891 Personal history of nicotine dependence: Secondary | ICD-10-CM | POA: Insufficient documentation

## 2015-03-27 DIAGNOSIS — Z79899 Other long term (current) drug therapy: Secondary | ICD-10-CM | POA: Insufficient documentation

## 2015-03-27 DIAGNOSIS — I480 Paroxysmal atrial fibrillation: Secondary | ICD-10-CM | POA: Insufficient documentation

## 2015-03-27 DIAGNOSIS — Z85828 Personal history of other malignant neoplasm of skin: Secondary | ICD-10-CM | POA: Diagnosis not present

## 2015-03-27 DIAGNOSIS — Z86010 Personal history of colonic polyps: Secondary | ICD-10-CM | POA: Insufficient documentation

## 2015-03-27 DIAGNOSIS — Z8739 Personal history of other diseases of the musculoskeletal system and connective tissue: Secondary | ICD-10-CM | POA: Insufficient documentation

## 2015-03-27 DIAGNOSIS — E119 Type 2 diabetes mellitus without complications: Secondary | ICD-10-CM | POA: Insufficient documentation

## 2015-03-27 DIAGNOSIS — Z8669 Personal history of other diseases of the nervous system and sense organs: Secondary | ICD-10-CM | POA: Insufficient documentation

## 2015-03-27 DIAGNOSIS — Z8709 Personal history of other diseases of the respiratory system: Secondary | ICD-10-CM | POA: Diagnosis not present

## 2015-03-27 DIAGNOSIS — Z794 Long term (current) use of insulin: Secondary | ICD-10-CM | POA: Diagnosis not present

## 2015-03-27 HISTORY — DX: Palpitations: R00.2

## 2015-03-27 HISTORY — DX: Unspecified atrial fibrillation: I48.91

## 2015-03-27 LAB — TSH: TSH: 1.857 u[IU]/mL (ref 0.350–4.500)

## 2015-03-27 LAB — CBC WITH DIFFERENTIAL/PLATELET
BASOS ABS: 0 10*3/uL (ref 0.0–0.1)
Basophils Relative: 0 %
Eosinophils Absolute: 0.1 10*3/uL (ref 0.0–0.7)
Eosinophils Relative: 1 %
HCT: 45.6 % (ref 39.0–52.0)
HEMOGLOBIN: 15.6 g/dL (ref 13.0–17.0)
Lymphocytes Relative: 17 %
Lymphs Abs: 1.2 10*3/uL (ref 0.7–4.0)
MCH: 31.6 pg (ref 26.0–34.0)
MCHC: 34.2 g/dL (ref 30.0–36.0)
MCV: 92.3 fL (ref 78.0–100.0)
Monocytes Absolute: 0.6 10*3/uL (ref 0.1–1.0)
Monocytes Relative: 8 %
Neutro Abs: 5.5 10*3/uL (ref 1.7–7.7)
Neutrophils Relative %: 74 %
Platelets: 219 10*3/uL (ref 150–400)
RBC: 4.94 MIL/uL (ref 4.22–5.81)
RDW: 13.2 % (ref 11.5–15.5)
WBC: 7.4 10*3/uL (ref 4.0–10.5)

## 2015-03-27 LAB — COMPREHENSIVE METABOLIC PANEL
ALBUMIN: 4.3 g/dL (ref 3.5–5.0)
ALT: 21 U/L (ref 17–63)
AST: 22 U/L (ref 15–41)
Alkaline Phosphatase: 85 U/L (ref 38–126)
Anion gap: 11 (ref 5–15)
BILIRUBIN TOTAL: 0.8 mg/dL (ref 0.3–1.2)
BUN: 21 mg/dL — ABNORMAL HIGH (ref 6–20)
CO2: 27 mmol/L (ref 22–32)
Calcium: 9.2 mg/dL (ref 8.9–10.3)
Chloride: 107 mmol/L (ref 101–111)
Creatinine, Ser: 0.94 mg/dL (ref 0.61–1.24)
GFR calc Af Amer: >60 mL/min (ref >60)
Glucose, Bld: 193 mg/dL — ABNORMAL HIGH (ref 65–99)
POTASSIUM: 4.6 mmol/L (ref 3.5–5.1)
Sodium: 145 mmol/L (ref 135–145)
TOTAL PROTEIN: 6.8 g/dL (ref 6.5–8.1)

## 2015-03-27 LAB — CBG MONITORING, ED: GLUCOSE-CAPILLARY: 181 mg/dL — AB (ref 65–99)

## 2015-03-27 LAB — URINALYSIS, ROUTINE W REFLEX MICROSCOPIC
Bilirubin Urine: NEGATIVE
Glucose, UA: NEGATIVE mg/dL
Hgb urine dipstick: NEGATIVE
Ketones, ur: NEGATIVE mg/dL
LEUKOCYTES UA: NEGATIVE
NITRITE: NEGATIVE
Protein, ur: NEGATIVE mg/dL
SPECIFIC GRAVITY, URINE: 1.009 (ref 1.005–1.030)
pH: 6.5 (ref 5.0–8.0)

## 2015-03-27 LAB — TROPONIN I: Troponin I: <0.03 ng/mL (ref <0.031)

## 2015-03-27 MED ORDER — ADENOSINE 6 MG/2ML IV SOLN
INTRAVENOUS | Status: AC
  Filled 2015-03-27: qty 6

## 2015-03-27 MED ORDER — XARELTO VTE STARTER PACK 15 & 20 MG PO TBPK: 15.0000 mg | ORAL_TABLET | ORAL | Status: DC

## 2015-03-27 MED ORDER — DILTIAZEM HCL ER COATED BEADS 120 MG PO CP24: 120.0000 mg | ORAL_CAPSULE | Freq: Every day | ORAL | Status: DC

## 2015-03-27 MED ORDER — DILTIAZEM LOAD VIA INFUSION
10.0000 mg | Freq: Once | INTRAVENOUS | Status: AC
  Administered 2015-03-27: 10 mg via INTRAVENOUS
  Filled 2015-03-27: qty 10

## 2015-03-27 MED ORDER — DEXTROSE 5 % IV SOLN
5.0000 mg/h | INTRAVENOUS | Status: DC
  Administered 2015-03-27: 5 mg/h via INTRAVENOUS
  Filled 2015-03-27: qty 100

## 2015-03-27 NOTE — ED Notes (Signed)
Pt from home reports that he started to have palpitations since 5:30 this am. Pt is asymptomatic and states that he has had this before but has never been to hospital or have it checked. Pt is ambulatory, A&O. Pt HR 150 upon arrival and Dr Stark Jock was at the bedside at arrival.

## 2015-03-27 NOTE — ED Notes (Signed)
Patient aware urine sample is needed. Urinal is at the bedside.

## 2015-03-27 NOTE — Discharge Instructions (Signed)
Cardizem and Xarelto as prescribed.  Follow-up with your primary Dr. as scheduled this week to discuss your situation.   Atrial Fibrillation Atrial fibrillation is a type of irregular or rapid heartbeat (arrhythmia). In atrial fibrillation, the heart quivers continuously in a chaotic pattern. This occurs when parts of the heart receive disorganized signals that make the heart unable to pump blood normally. This can increase the risk for stroke, heart failure, and other heart-related conditions. There are different types of atrial fibrillation, including:  Paroxysmal atrial fibrillation. This type starts suddenly, and it usually stops on its own shortly after it starts.  Persistent atrial fibrillation. This type often lasts longer than a week. It may stop on its own or with treatment.  Long-lasting persistent atrial fibrillation. This type lasts longer than 12 months.  Permanent atrial fibrillation. This type does not go away. Talk with your health care provider to learn about the type of atrial fibrillation that you have. CAUSES This condition is caused by some heart-related conditions or procedures, including:  A heart attack.  Coronary artery disease.  Heart failure.  Heart valve conditions.  High blood pressure.  Inflammation of the sac that surrounds the heart (pericarditis).  Heart surgery.  Certain heart rhythm disorders, such as Wolf-Parkinson-White syndrome. Other causes include:  Pneumonia.  Obstructive sleep apnea.  Blockage of an artery in the lungs (pulmonary embolism, or PE).  Lung cancer.  Chronic lung disease.  Thyroid problems, especially if the thyroid is overactive (hyperthyroidism).  Caffeine.  Excessive alcohol use or illegal drug use.  Use of some medicines, including certain decongestants and diet pills. Sometimes, the cause cannot be found. RISK FACTORS This condition is more likely to develop in:  People who are older in age.  People  who smoke.  People who have diabetes mellitus.  People who are overweight (obese).  Athletes who exercise vigorously. SYMPTOMS Symptoms of this condition include:  A feeling that your heart is beating rapidly or irregularly.  A feeling of discomfort or pain in your chest.  Shortness of breath.  Sudden light-headedness or weakness.  Getting tired easily during exercise. In some cases, there are no symptoms. DIAGNOSIS Your health care provider may be able to detect atrial fibrillation when taking your pulse. If detected, this condition may be diagnosed with:  An electrocardiogram (ECG).  A Holter monitor test that records your heartbeat patterns over a 24-hour period.  Transthoracic echocardiogram (TTE) to evaluate how blood flows through your heart.  Transesophageal echocardiogram (TEE) to view more detailed images of your heart.  A stress test.  Imaging tests, such as a CT scan or chest X-ray.  Blood tests. TREATMENT The main goals of treatment are to prevent blood clots from forming and to keep your heart beating at a normal rate and rhythm. The type of treatment that you receive depends on many factors, such as your underlying medical conditions and how you feel when you are experiencing atrial fibrillation. This condition may be treated with:  Medicine to slow down the heart rate, bring the heart's rhythm back to normal, or prevent clots from forming.  Electrical cardioversion. This is a procedure that resets your heart's rhythm by delivering a controlled, low-energy shock to the heart through your skin.  Different types of ablation, such as catheter ablation, catheter ablation with pacemaker, or surgical ablation. These procedures destroy the heart tissues that send abnormal signals. When the pacemaker is used, it is placed under your skin to help your heart beat in a  regular rhythm. HOME CARE INSTRUCTIONS  Take over-the counter and prescription medicines only as  told by your health care provider.  If your health care provider prescribed a blood-thinning medicine (anticoagulant), take it exactly as told. Taking too much blood-thinning medicine can cause bleeding. If you do not take enough blood-thinning medicine, you will not have the protection that you need against stroke and other problems.  Do not use tobacco products, including cigarettes, chewing tobacco, and e-cigarettes. If you need help quitting, ask your health care provider.  If you have obstructive sleep apnea, manage your condition as told by your health care provider.  Do not drink alcohol.  Do not drink beverages that contain caffeine, such as coffee, soda, and tea.  Maintain a healthy weight. Do not use diet pills unless your health care provider approves. Diet pills may make heart problems worse.  Follow diet instructions as told by your health care provider.  Exercise regularly as told by your health care provider.  Keep all follow-up visits as told by your health care provider. This is important. PREVENTION  Avoid drinking beverages that contain caffeine or alcohol.  Avoid certain medicines, especially medicines that are used for breathing problems.  Avoid certain herbs and herbal medicines, such as those that contain ephedra or ginseng.  Do not use illegal drugs, such as cocaine and amphetamines.  Do not smoke.  Manage your high blood pressure. SEEK MEDICAL CARE IF:  You notice a change in the rate, rhythm, or strength of your heartbeat.  You are taking an anticoagulant and you notice increased bruising.  You tire more easily when you exercise or exert yourself. SEEK IMMEDIATE MEDICAL CARE IF:  You have chest pain, abdominal pain, sweating, or weakness.  You feel nauseous.  You notice blood in your vomit, bowel movement, or urine.  You have shortness of breath.  You suddenly have swollen feet and ankles.  You feel dizzy.  You have sudden weakness or  numbness of the face, arm, or leg, especially on one side of the body.  You have trouble speaking, trouble understanding, or both (aphasia).  Your face or your eyelid droops on one side. These symptoms may represent a serious problem that is an emergency. Do not wait to see if the symptoms will go away. Get medical help right away. Call your local emergency services (911 in the [place] Do not drive yourself to the hospital.   This information is not intended to replace advice given to you by your health care provider. Make sure you discuss any questions you have with your health care provider.   Document Released: 03/26/2005 Document Revised: 12/15/2014 Document Reviewed: 07/21/2014 [employer]vier Interactive Patient Education [employer].

## 2015-03-27 NOTE — ED Provider Notes (Signed)
CSN: [id number]     Arrival date & time 03/27/15  0908 History   First MD Initiated Contact with Patient 03/27/15 0910     No chief complaint on file.    (Consider location/radiation/quality/duration/timing/severity/associated sxs/prior Treatment) HPI Comments: Patient is a 74 year old male with history of diabetes, hypercholesterolemia. He presents for evaluation of weakness and palpitations that started this morning at approximately 5:30. He reports feeling a fluttering in his chest. He also reports having to urinate every 30 minutes since this began. He denies any fevers or chills. He denies any dysuria. Denies any history of known arrhythmias, however does state that he has had palpitations in the past which have been undiagnosed.  Patient is a 74 y.o. male presenting with palpitations. The history is provided by the patient.  Palpitations Palpitations quality:  Regular Onset quality:  Sudden Duration:  4 hours Timing:  Constant Progression:  Unchanged Chronicity:  New Relieved by:  Nothing Worsened by:  Nothing Ineffective treatments:  None tried Associated symptoms: weakness   Associated symptoms: no chest pain and no cough     Past Medical History  Diagnosis Date  . Diabetes mellitus with neuropathy   . Hyperlipidemia   . CAD (coronary artery disease)   . DJD (degenerative joint disease)   . Anxiety   . Erectile dysfunction   . Colon polyp     Cscope at [facility] aprox 2006, repeated 2010 (-), next 5 years  . Eye muscle paralysis     congenital  . Skin cancer     sees derm  . Diaphragm paralysis     R, per pt   . Mild cognitive disorder     Unspecified mild NCD   Past Surgical History  Procedure Laterality Date  . Carpal tunnel release      B, 2000  . Hip surgery      R, 1964, pins  . Knee surgery       L 2005  . Appendectomy    . Hip surgery      RIGHT--s/p hip replacement, s/p revision August 2009 due to dislocation x 3   . Medial partial knee replacement   08-2012    R  . Hip surgery  04-2012    R hip revision  . [facility]   No family history on file. Social History  Substance Use Topics  . Smoking status: Former Research scientist (life sciences)  . Smokeless tobacco: Never Used     Comment: quit 1980s  . Alcohol Use: 0.0 oz/week    0 Standard drinks or equivalent per week     Comment: rarely     Review of Systems  Respiratory: Negative for cough.   Cardiovascular: Positive for palpitations. Negative for chest pain.  Neurological: Positive for weakness.  All other systems reviewed and are negative.     Allergies  Review of patient's allergies indicates no known allergies.  Home Medications   Prior to Admission medications   Medication Sig Start Date End Date Taking? Authorizing Provider  azelastine (ASTELIN) 137 MCG/SPRAY nasal spray Place 2 sprays into the nose 2 (two) times daily. Use in each nostril as directed 05/20/12   Colon Branch, MD  CINNAMON PO Take 2 capsules by mouth 2 (two) times daily.    Historical Provider, MD  donepezil (ARICEPT) 5 MG tablet Take 1 tablet (5 mg total) by mouth daily. 12/02/14   Colon Branch, MD  FLUoxetine (PROZAC) 20 MG tablet Take  2 tablets (40 mg total) by mouth daily. 07/01/14   Colon Branch, MD  furosemide (LASIX) 20 MG tablet Take 1 tablet (20 mg total) by mouth daily. 12/02/14   Colon Branch, MD  insulin glargine (LANTUS SOLOSTAR) 100 UNIT/ML injection Inject 20 Units into the skin at bedtime.     Historical Provider, MD  loratadine (CLARITIN) 10 MG tablet Take 1 tablet (10 mg total) by mouth daily. 12/02/14   Colon Branch, MD  losartan (COZAAR) 100 MG tablet Take 1 tablet (100 mg total) by mouth daily. 12/02/14   Colon Branch, MD  metFORMIN (GLUCOPHAGE) 1000 MG tablet Take 1 tablet (1,000 mg total) by mouth 2 (two) times daily with a meal. 12/02/14   Colon Branch, MD  omeprazole (PRILOSEC) 20 MG capsule Take 1 capsule (20 mg total) by mouth daily. 12/02/14   Colon Branch, MD  simvastatin (ZOCOR) 40  MG tablet Take 0.5 tablets (20 mg total) by mouth at bedtime. 12/02/14   Colon Branch, MD   BP 90/70 mmHg  Pulse 150  Resp 22  SpO2 94% Physical Exam  Constitutional: He is oriented to person, place, and time. He appears well-developed and well-nourished. No distress.  HENT:  Head: Normocephalic and atraumatic.  Neck: Normal range of motion. Neck supple.  Cardiovascular: Normal rate, regular rhythm and normal heart sounds.   No murmur heard. Pulmonary/Chest: Effort normal and breath sounds normal. No respiratory distress. He has no wheezes. He has no rales.  Abdominal: Soft. Bowel sounds are normal. He exhibits no distension. There is no tenderness.  Musculoskeletal: Normal range of motion. He exhibits no edema.  Neurological: He is alert and oriented to person, place, and time.  Skin: Skin is warm and dry. He is not diaphoretic.  Nursing note and vitals reviewed.   ED Course  Procedures (including critical care time) Labs Review Labs Reviewed  CBG MONITORING, ED - Abnormal; Notable for the following:    Glucose-Capillary 181 (*)    All other components within normal limits  COMPREHENSIVE METABOLIC PANEL  CBC WITH DIFFERENTIAL/PLATELET  TROPONIN I  TSH    Imaging Review No results found. I have personally reviewed and evaluated these images and lab results as part of my medical decision-making.   EKG Interpretation   Date/Time:  Sunday March 27 2015 09:23:12 EST Ventricular Rate:  134 PR Interval:    QRS Duration: 110 QT Interval:  338 QTC Calculation: 505 R Axis:   60 Text Interpretation:  Atrial fibrillation Ventricular premature complex ST  depression, probably rate related Baseline wander in lead(s) I V1  Confirmed by Elpidio Thielen  MD, Seibert Keeter ([id number]) on 03/27/2015 9:32:19 AM      MDM   Final diagnoses:  None    Patient presents here with complaints of palpitations. He was found to be in atrial fibrillation with RVR. His laboratory studies are otherwise  unremarkable. He was started on a Cardizem drip which within approximately 30 minutes did cause a conversion to sinus rhythm. He has remained there since. I have discussed the patient's care with Dr. Marlou Porch who has recommended starting the patient on both Cardizem and xarelto.  He has a follow-up with his primary Dr. this week. I've advised him to discuss this with his physician at that time.    Veryl Speak, MD 03/27/15 [phone]

## 2015-03-29 ENCOUNTER — Ambulatory Visit (INDEPENDENT_AMBULATORY_CARE_PROVIDER_SITE_OTHER): Payer: Medicare Other | Admitting: Internal Medicine

## 2015-03-29 ENCOUNTER — Telehealth: Payer: Self-pay | Admitting: Internal Medicine

## 2015-03-29 ENCOUNTER — Encounter: Payer: Self-pay | Admitting: Internal Medicine

## 2015-03-29 VITALS — BP 132/62 | HR 57 | Temp 98.1°F | Ht 79.0 in | Wt 239.0 lb

## 2015-03-29 DIAGNOSIS — Z09 Encounter for follow-up examination after completed treatment for conditions other than malignant neoplasm: Secondary | ICD-10-CM

## 2015-03-29 DIAGNOSIS — I4891 Unspecified atrial fibrillation: Secondary | ICD-10-CM | POA: Diagnosis not present

## 2015-03-29 MED ORDER — RIVAROXABAN 20 MG PO TABS: 20.0000 mg | ORAL_TABLET | Freq: Every day | ORAL | Status: DC

## 2015-03-29 NOTE — Telephone Encounter (Signed)
Xarelto 20 mg one tablet daily, provide #30 samples.

## 2015-03-29 NOTE — Assessment & Plan Note (Signed)
Atrial fibrillation: New onset,I suspect this is going on for a while, several months ago had episode of palpitations (eval at [name]).   EKG: Sinus bradycardia with first-degree AV block. Discussed the risk of atrial fibrillation, also risk/benefits of anticoag. Rec to start ASAP CCBs-xarelto with close monitoring of BP and pulse.  Addendum: He was able to get CCB but no Xarelto, samples of Xarelto 20 mg one by mouth daily provided to the patient. Refer to  Cardiology DM, having episodes of low blood sugar, this is follow-up at [name], last A1c 6.0 per patient. Strongly recommend to discuss dm management with his [place] doctor, needs to avoid episodes of hypoglycemia RTC when necessary

## 2015-03-29 NOTE — Progress Notes (Signed)
Subjective:    Patient ID: Ivan Anderson, male    DOB: Feb 23, 1941, 74 y.o.   MRN: [id number]  DOS:  03/29/2015 Type of visit - description : emergency room follow-up Interval history: went to the ER with palpitations 03/27/2015, found to be on atrial fibrillation with RVR, respond quickly to Cardizem. After they d/w cardiology he was sent home with follow-up with me. Was recommended to start Cardizem and xarelto but so far he has not been able to start meds.  Labs at the ER: CMP, troponin, CBC and TSH were normal.   Review of Systems denies chest pain or difficulty breathing before and after the ER visit. No nausea, vomiting, diarrhea or blood in the stools. No gross hematuria. Reports at least 2 episodes of low blood sugars, ~ 43,  associated with sweats.  Past Medical History  Diagnosis Date  . Diabetes mellitus with neuropathy ([place])   . Hyperlipidemia   . CAD (coronary artery disease)   . DJD (degenerative joint disease)   . Anxiety   . Erectile dysfunction   . Colon polyp     Cscope at [facility] aprox 2006, repeated 2010 (-), next 5 years  . Eye muscle paralysis     congenital  . Skin cancer     sees derm  . Diaphragm paralysis     R, per pt   . Mild cognitive disorder     Unspecified mild NCD  . Heart palpitations   . Atrial fibrillation, new onset ([place]) 03/27/2015    Past Surgical History  Procedure Laterality Date  . Carpal tunnel release      B, 2000  . Hip surgery      R, 1964, pins  . Knee surgery       L 2005  . Appendectomy    . Hip surgery      RIGHT--s/p hip replacement, s/p revision August 2009 due to dislocation x 3   . Medial partial knee replacement  08-2012    R  . Hip surgery  04-2012    R hip revision  . [facility]    Social History   Social History  . Marital Status: Married    Spouse Name: N/A  . Number of Children: N/A  . Years of Education: N/A   Occupational History  . realtor    Social  History Main Topics  . Smoking status: Former Research scientist (life sciences)  . Smokeless tobacco: Never Used     Comment: quit 1980s  . Alcohol Use: 0.0 oz/week    0 Standard drinks or equivalent per week     Comment: rarely   . Drug Use: No  . Sexual Activity: Not on file   Other Topics Concern  . Not on file   Social History Narrative        Medication List       This list is accurate as of: 03/29/15  5:44 PM.  Always use your most recent med list.               azelastine 0.1 % nasal spray  Commonly known as:  ASTELIN  Place 2 sprays into the nose 2 (two) times daily. Use in each nostril as directed     Cinnamon 500 MG capsule  Take 500 mg by mouth 2 (two) times daily.     diltiazem 120 MG 24 hr capsule  Commonly known as:  CARDIZEM CD  Take 1 capsule (120  mg total) by mouth daily.     FLUoxetine 20 MG tablet  Commonly known as:  PROZAC  Take 2 tablets (40 mg total) by mouth daily.     furosemide 20 MG tablet  Commonly known as:  LASIX  Take 1 tablet (20 mg total) by mouth daily.     LANTUS SOLOSTAR 100 UNIT/ML injection  Generic drug:  insulin glargine  Inject 20 Units into the skin at bedtime.     loratadine 10 MG tablet  Commonly known as:  CLARITIN  Take 10 mg by mouth every morning.     losartan 100 MG tablet  Commonly known as:  COZAAR  Take 1 tablet (100 mg total) by mouth daily.     metFORMIN 1000 MG tablet  Commonly known as:  GLUCOPHAGE  Take 1 tablet (1,000 mg total) by mouth 2 (two) times daily with a meal.     multivitamin tablet  Take 1 tablet by mouth every morning.     omeprazole 20 MG capsule  Commonly known as:  PRILOSEC  Take 1 capsule (20 mg total) by mouth daily.     rivaroxaban 20 MG Tabs tablet  Commonly known as:  XARELTO  Take 1 tablet (20 mg total) by mouth daily with supper.     simvastatin 40 MG tablet  Commonly known as:  ZOCOR  Take 40 mg by mouth.           Objective:   Physical Exam BP 132/62 mmHg  Pulse 57  Temp(Src)  98.1 F (36.7 C) (Oral)  Ht 6\' 7"  (2.007 m)  Wt 239 lb (108.41 kg)  BMI 26.91 kg/m2  SpO2 98% General:   Well developed, well nourished . NAD.  HEENT:  Normocephalic . Face symmetric, atraumatic Lungs:  CTA B Normal respiratory effort, no intercostal retractions, no accessory muscle use. Heart: RRR,  no murmur.  No pretibial edema bilaterally  Skin: Not pale. Not jaundice Neurologic:  alert & oriented X3.  Speech normal, gait appropriate for age and unassisted Psych--  Cognition and judgment appear intact.  Cooperative with normal attention span and concentration.  Behavior appropriate. No anxious or depressed appearing.      Assessment & Plan:   Assessment -- seen regularly at [name], most chronic med issues f/u there DM with neuropathy   Hyperlipidemia Anxiety CV: --CAD --A. Fib, new onset 03/27/2015 DJD Skin cancer, sees dermatology Right diaphragmatic paralysis per patient Mild cognitive impairment, MMSE decreased from 27 to 20 on March 2016, Rxed Aricept, later dc b/c was retested at [place] and was ok H/o eye muscle paralysis congenital  PLAN: Atrial fibrillation: New onset,I suspect this is going on for a while, several months ago had episode of palpitations (eval at [name]).   EKG: Sinus bradycardia with first-degree AV block. Discussed the risk of atrial fibrillation, also risk/benefits of anticoag. Rec to start ASAP CCBs-xarelto with close monitoring of BP and pulse.  Addendum: He was able to get CCB but no Xarelto, samples of Xarelto 20 mg one by mouth daily provided to the patient. Refer to  Cardiology DM, having episodes of low blood sugar, this is follow-up at [name], last A1c 6.0 per patient. Strongly recommend to discuss dm management with his [place] doctor, needs to avoid episodes of hypoglycemia RTC when necessary

## 2015-03-29 NOTE — Patient Instructions (Signed)
Start Cardizem , Xarelto as soon as possible  Check the  blood pressure daily along with the heart rate Be sure your blood pressure is between 110/65 and  145/85.  if it is consistently higher or lower, let me know Also if the pulse is less than 50

## 2015-03-29 NOTE — Telephone Encounter (Signed)
Caller name: Self   Can be reached: [phone]  Reason for call: Patient called back stating that he could not get his rx for XARELTO. Plse Adv. He states that Dr. Larose Kells told him to call if he could not get it

## 2015-03-29 NOTE — Telephone Encounter (Signed)
FYI. Please advise.

## 2015-03-29 NOTE — Progress Notes (Signed)
Pre visit review using our clinic review tool, if applicable. No additional management support is needed unless otherwise documented below in the visit note. 

## 2015-03-29 NOTE — Telephone Encounter (Signed)
LMOM informing Pt to come by office to pick up Xarelto 20 mg samples since his pharmacy does not have the [place], informed him that samples will be at front desk for pick up at his convenience. Instructed Pt to call office if he has any questions or concerns.

## 2015-03-30 ENCOUNTER — Ambulatory Visit: Payer: Medicare Other | Admitting: Internal Medicine

## 2015-04-07 ENCOUNTER — Encounter: Payer: Self-pay | Admitting: Physician Assistant

## 2015-04-07 ENCOUNTER — Ambulatory Visit (INDEPENDENT_AMBULATORY_CARE_PROVIDER_SITE_OTHER): Payer: Medicare Other | Admitting: Physician Assistant

## 2015-04-07 VITALS — BP 120/50 | HR 62 | Ht 79.0 in | Wt 239.0 lb

## 2015-04-07 DIAGNOSIS — I4891 Unspecified atrial fibrillation: Secondary | ICD-10-CM | POA: Diagnosis not present

## 2015-04-07 MED ORDER — RIVAROXABAN 20 MG PO TABS: 20.0000 mg | ORAL_TABLET | Freq: Every day | ORAL | Status: DC

## 2015-04-07 MED ORDER — DILTIAZEM HCL ER COATED BEADS 120 MG PO CP24: 120.0000 mg | ORAL_CAPSULE | Freq: Every day | ORAL | Status: DC

## 2015-04-07 NOTE — Patient Instructions (Addendum)
Medication Instructions:  Your physician recommends that you continue on your current medications as directed. Please refer to the Current Medication list given to you today.   Labwork: None ordered  Testing/Procedures: Your physician has requested that you have an echocardiogram. Echocardiography is a painless test that uses sound waves to create images of your heart. It provides your doctor with information about the size and shape of your heart and how well your heart's chambers and valves are working. This procedure takes approximately one hour. There are no restrictions for this procedure.    Follow-Up: Your physician recommends that you schedule a follow-up appointment in: 3 MONTHS WITH DR. Burt Knack   Any Other Special Instructions Will Be Listed Below (If Applicable).  Echocardiogram An echocardiogram, or echocardiography, uses sound waves (ultrasound) to produce an image of your heart. The echocardiogram is simple, painless, obtained within a short period of time, and offers valuable information to your health care provider. The images from an echocardiogram can provide information such as:  Evidence of coronary artery disease (CAD).  Heart size.  Heart muscle function.  Heart valve function.  Aneurysm detection.  Evidence of a past heart attack.  Fluid buildup around the heart.  Heart muscle thickening.  Assess heart valve function. LET [facility] CARE PROVIDER KNOW ABOUT:  Any allergies you have.  All medicines you are taking, including vitamins, herbs, eye drops, creams, and over-the-counter medicines.  Previous problems you or members of your family have had with the use of anesthetics.  Any blood disorders you have.  Previous surgeries you have had.  Medical conditions you have.  Possibility of pregnancy, if this applies. BEFORE THE PROCEDURE  No special preparation is needed. Eat and drink normally.  PROCEDURE   In order to produce an image of  your heart, gel will be applied to your chest and a wand-like tool (transducer) will be moved over your chest. The gel will help transmit the sound waves from the transducer. The sound waves will harmlessly bounce off your heart to allow the heart images to be captured in real-time motion. These images will then be recorded.  You may need an IV to receive a medicine that improves the quality of the pictures. AFTER THE PROCEDURE You may return to your normal schedule including diet, activities, and medicines, unless your health care provider tells you otherwise.   This information is not intended to replace advice given to you by your health care provider. Make sure you discuss any questions you have with your health care provider.   Document Released: 03/23/2000 Document Revised: 04/16/2014 Document Reviewed: 12/01/2012 Elsevier Interactive Patient Education [employer].  If you need a refill on your cardiac medications before your next appointment, please call your pharmacy.

## 2015-04-07 NOTE — Progress Notes (Signed)
Cardiology Office Note   Date:  04/07/2015   ID:  Ivan, Anderson 04/27/1940, MRN [id number]  PCP:  [name], MD  Cardiologist:  New - Dr. Burt Knack   Chief Complaint  Patient presents with  . New Patient (Initial Visit)    seen with Dr. Burt Knack DOD, referred by Dr. Larose Kells  . Atrial Fibrillation      History of Present Illness: Ivan Anderson is a 74 y.o. male who presents for new cardiology visit for atrial fibrillation. He has a PMH of DM, hyperlipidemia however no past cardiac history. He did have a negative stress test on 08/24/2011. His last stress test was a treadmill nuc at [facility] in 09/2014 which reportedly was negative. On arrival in ED on 12/18, he was started on Cardizem drip and within 30 minutes, he spontaneously converted back to sinus rhythm. He presented to ED on 03/27/2015 with weakness and palpitations and found to have new onset of atrial fibrillation with RVR. TSH normal. ED physician discussed with Dr. Marlou Porch, who recommended the patient to start on both Cardizem and Xarelto follow-up with his primary care doctor. He was seen by his primary care doctor about 12/20, at which time he has not started on both Cardizem or Xarelto since discharged from ED. He has since started on both medications. He was referred to cardiology service for further management of atrial fibrillation. According to the patient, he has been having palpitation on and off for the past 30 years, he used to be a heavy drinker of soda when he was working in [employer]. He denies recent fever, chill or cough. He does not remember the last time he had an echocardiogram.  He presents today for new cardiology visit. He has been maintaining NSR. No bleeding issue on Xarelto. He does not drink EtOH regularly. He plan to obtain Xarelto from his doctor in [place] hospital. He denies any chest pain, LE edema, orthopnea, or PND.     Past Medical History  Diagnosis Date  . Diabetes mellitus with neuropathy ([place])   .  Hyperlipidemia   . CAD (coronary artery disease)   . DJD (degenerative joint disease)   . Anxiety   . Erectile dysfunction   . Colon polyp     Cscope at [facility] aprox 2006, repeated 2010 (-), next 5 years  . Eye muscle paralysis     congenital  . Skin cancer     sees derm  . Diaphragm paralysis     R, per pt   . Mild cognitive disorder     Unspecified mild NCD  . Heart palpitations   . Atrial fibrillation, new onset ([place]) 03/27/2015    Past Surgical History  Procedure Laterality Date  . Carpal tunnel release      B, 2000  . Hip surgery      R, 1964, pins  . Knee surgery       L 2005  . Appendectomy    . Hip surgery      RIGHT--s/p hip replacement, s/p revision August 2009 due to dislocation x 3   . Medial partial knee replacement  08-2012    R  . Hip surgery  04-2012    R hip revision  . [facility]  [facility]     Current Outpatient Prescriptions  Medication Sig Dispense Refill  . azelastine (ASTELIN) 137 MCG/SPRAY nasal spray Place 2 sprays into the nose 2 (two) times daily. Use  in each nostril as directed 30 mL 1  . Cinnamon 500 MG capsule Take 500 mg by mouth 2 (two) times daily.     Marland Kitchen diltiazem (CARDIZEM CD) 120 MG 24 hr capsule Take 1 capsule (120 mg total) by mouth daily. 90 capsule 3  . FLUoxetine (PROZAC) 20 MG tablet Take 2 tablets (40 mg total) by mouth daily. 60 tablet 8  . furosemide (LASIX) 20 MG tablet Take 1 tablet (20 mg total) by mouth daily.    . insulin glargine (LANTUS SOLOSTAR) 100 UNIT/ML injection Inject 20 Units into the skin at bedtime.     Marland Kitchen loratadine (CLARITIN) 10 MG tablet Take 10 mg by mouth every morning.     Marland Kitchen losartan (COZAAR) 100 MG tablet Take 1 tablet (100 mg total) by mouth daily.    . metFORMIN (GLUCOPHAGE) 1000 MG tablet Take 1 tablet (1,000 mg total) by mouth 2 (two) times daily with a meal.    . Multiple Vitamin (MULTIVITAMIN) tablet Take 1 tablet by mouth every morning.     Marland Kitchen omeprazole (PRILOSEC) 20 MG  capsule Take 1 capsule (20 mg total) by mouth daily.    . rivaroxaban (XARELTO) 20 MG TABS tablet Take 1 tablet (20 mg total) by mouth daily with supper. 90 tablet 3  . simvastatin (ZOCOR) 40 MG tablet Take 40 mg by mouth.     No current facility-administered medications for this visit.    Allergies:   Pseudoephedrine    Social History:  The patient  reports that he has quit smoking. He has never used smokeless tobacco. He reports that he drinks alcohol. He reports that he does not use illicit drugs.   Family History:  The patient's family history includes Diabetes in his maternal grandmother. There is no history of Heart attack, Stroke, or Hypertension.    ROS:  Please see the history of present illness.   Otherwise, review of systems are positive for occasional palpitation before recent ED visit.   All other systems are reviewed and negative.    PHYSICAL EXAM: VS:  BP 120/50 mmHg  Pulse 62  Ht 6\' 7"  (2.007 m)  Wt 239 lb (108.41 kg)  BMI 26.91 kg/m2 , BMI Body mass index is 26.91 kg/(m^2). GEN: Well nourished, well developed, in no acute distress HEENT: normal Neck: no JVD, carotid bruits, or masses Cardiac: RRR; no murmurs, rubs, or gallops,no edema  Respiratory:  clear to auscultation bilaterally, normal work of breathing GI: soft, nontender, nondistended, + BS MS: no deformity or atrophy Skin: warm and dry, no rash Neuro:  Strength and sensation are intact Psych: euthymic mood, full affect   EKG:  EKG is ordered today. The ekg ordered today demonstrates NSR without significant ST-T wave changes   Recent Labs: 03/27/2015: ALT 21; BUN 21*; Creatinine, Ser 0.94; Hemoglobin 15.6; Platelets 219; Potassium 4.6; Sodium 145; TSH 1.857    Lipid Panel    Component Value Date/Time   CHOL 145 07/15/2014   TRIG 39* 07/15/2014   HDL 60 07/15/2014   CHOLHDL 3 10/23/2013 0833   VLDL 4.4 10/23/2013 0833   LDLCALC 77 07/15/2014      Wt Readings from Last 3 Encounters:    04/07/15 239 lb (108.41 kg)  03/29/15 239 lb (108.41 kg)  12/17/14 235 lb (106.595 kg)      Other studies Reviewed: Additional studies/ records that were reviewed today include:   Recent ED visit. Last EKG from 12/18  Review of the above records demonstrates: Newly diagnosed  afib seen in ED on 12/18, placed on diltaizem and Xarelto   ASSESSMENT AND PLAN:  1.  Newly diagnosed paroxysmal atrial fibrillation  - TSH, no EtOH history. Likely related to age, but per pt, has intermittent palpitation for 30 years, used to drink a lot of soda.  - seen in ED on 12/18, spontaneously converted on IV diltiazem, discharged from ED on PO diltiazem and Xarelto  - CHA2DS2-Vasc score 3 (DM, age), tolerating diltiazem and Xarelto ok, maintaining NSR. Patient seen with DOD, Dr. Burt Knack, will obtain echo to assess LA size. Will continue current medication and followup in 3 month.   2. Hyperlipidemia: on zocor  3. DM II, followed by PCP, on Metformin.   Current medicines are reviewed at length with the patient today.  The patient does not have concerns regarding medicines.  The following changes have been made:  no change  Labs/ tests ordered today include:   Orders Placed This Encounter  Procedures  . EKG 12-Lead  . Echocardiogram     Disposition:   FU with Dr. Burt Knack in 3 months  Signed, Almyra Deforest PA 04/07/2015 9:50 AM    [address], [place], [place]  [address] Phone: [phone]; Fax: [phone]

## 2015-04-08 ENCOUNTER — Ambulatory Visit (HOSPITAL_COMMUNITY): Payer: Medicare Other | Attending: Cardiovascular Disease

## 2015-04-08 ENCOUNTER — Other Ambulatory Visit: Payer: Self-pay

## 2015-04-08 DIAGNOSIS — E785 Hyperlipidemia, unspecified: Secondary | ICD-10-CM | POA: Insufficient documentation

## 2015-04-08 DIAGNOSIS — I071 Rheumatic tricuspid insufficiency: Secondary | ICD-10-CM | POA: Diagnosis not present

## 2015-04-08 DIAGNOSIS — I4891 Unspecified atrial fibrillation: Secondary | ICD-10-CM | POA: Insufficient documentation

## 2015-04-08 DIAGNOSIS — I517 Cardiomegaly: Secondary | ICD-10-CM | POA: Diagnosis not present

## 2015-04-08 DIAGNOSIS — E119 Type 2 diabetes mellitus without complications: Secondary | ICD-10-CM | POA: Insufficient documentation

## 2015-04-10 DIAGNOSIS — R3129 Other microscopic hematuria: Secondary | ICD-10-CM

## 2015-04-10 HISTORY — DX: Other microscopic hematuria: R31.29

## 2015-04-10 HISTORY — PX: CYSTOSCOPY: SUR368

## 2015-04-12 ENCOUNTER — Telehealth: Payer: Self-pay | Admitting: *Deleted

## 2015-04-12 ENCOUNTER — Telehealth: Payer: Self-pay | Admitting: Internal Medicine

## 2015-04-12 NOTE — Telephone Encounter (Signed)
-----   Message from [place], [place] sent at 04/08/2015  9:46 PM EST ----- Normal heart pumping function, moderately thickened heart wall which should not pose any near term problem, most of the valve functioning normally, mildly leaky tricuspid which does not warrant any repair, leaky valve happen with age. Left atrium (top chamber of the heart) where atrial fibrillation originate is mildly dilated which does suggested slightly higher probability of recurrent atrial fibrillation. Avoid too much caffeine or energy drink

## 2015-04-12 NOTE — Telephone Encounter (Signed)
Caller name: Henrique  Relationship to patient: Self   Can be reached: [phone]  Reason for call: Pt says that he had a Echocardiogram and would like to have a copy faxed over to [name].    Fax: [phone]  Dr. Nilda Riggs --- pt has an appt with this provider on 1/12, pt would like to have everything to him by then if possible.

## 2015-04-12 NOTE — Telephone Encounter (Signed)
Called pt,. Per Almyra Deforest, PA-C, to let him know that the results from his ECHO.  Pt verbalized understanding.

## 2015-04-12 NOTE — Telephone Encounter (Signed)
Pt will need to come to office to sign release of information for medical records to be sent to [facility].

## 2015-04-18 NOTE — Telephone Encounter (Signed)
Spoke with pt. He says that he was able to access records from My Chart so he is taken care of.    Thanks.

## 2015-06-02 DIAGNOSIS — I10 Essential (primary) hypertension: Secondary | ICD-10-CM | POA: Diagnosis not present

## 2015-06-02 DIAGNOSIS — Z96641 Presence of right artificial hip joint: Secondary | ICD-10-CM | POA: Diagnosis not present

## 2015-06-02 DIAGNOSIS — E1165 Type 2 diabetes mellitus with hyperglycemia: Secondary | ICD-10-CM | POA: Diagnosis not present

## 2015-06-02 DIAGNOSIS — Z794 Long term (current) use of insulin: Secondary | ICD-10-CM | POA: Diagnosis not present

## 2015-06-02 DIAGNOSIS — M1612 Unilateral primary osteoarthritis, left hip: Secondary | ICD-10-CM | POA: Diagnosis not present

## 2015-07-01 ENCOUNTER — Ambulatory Visit (INDEPENDENT_AMBULATORY_CARE_PROVIDER_SITE_OTHER): Payer: Medicare Other | Admitting: Cardiovascular Disease

## 2015-07-01 ENCOUNTER — Encounter: Payer: Self-pay | Admitting: Cardiovascular Disease

## 2015-07-01 VITALS — BP 130/62 | HR 64 | Ht 78.5 in | Wt 243.4 lb

## 2015-07-01 DIAGNOSIS — I4891 Unspecified atrial fibrillation: Secondary | ICD-10-CM | POA: Diagnosis not present

## 2015-07-01 NOTE — Patient Instructions (Signed)
Medication Instructions:  Your physician has recommended you make the following change in your medication:  1. Continue Xarelto 2. STOP Eliquis  Labwork: No new order.   Testing/Procedures: No new orders.   Follow-Up: Your physician wants you to follow-up in: 6 MONTHS with PA/NP. You will receive a reminder letter in the mail two months in advance. If you don't receive a letter, please call our office to schedule the follow-up appointment.  You are cleared from a cardiac standpoint for hip surgery.  Please hold Xarelto 3 days prior to surgery.    Any Other Special Instructions Will Be Listed Below (If Applicable).     If you need a refill on your cardiac medications before your next appointment, please call your pharmacy.

## 2015-07-01 NOTE — Progress Notes (Signed)
Cardiology Office Note Date:  07/02/2015   ID:  Ivan Anderson, Ivan Anderson [birth year], MRN [id number]  PCP:  [name], MD  Cardiologist:  Sherren Mocha, MD    Chief Complaint  Patient presents with  . Hypertension     History of Present Illness: Ivan Anderson is a 75 y.o. male who presents for Follow-up of atrial fibrillation. The patient was hospitalized in December 2016 with weakness and heart palpitations and was found to be in atrial fibrillation with rapid ventricular response. The patient was treated with Cardizem and anticoagulated with Xarelto. He converted back to sinus rhythm spontaneously. He was initially seen in our outpatient practice 04/07/2015 by Almyra Deforest, PA-C. The patient's CHADS-Vasc is 3. At that time he was in sinus rhythm and felt to be stable. He was tolerating Xarelto without bleeding problems. An echocardiogram was performed. This shows vigorous LV systolic function, moderate LVH, no significant valvular disease, and a mildly dilated left atrium.  The patient is doing well. 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 He has had shortness of breath when walking up to a mile but feels like this is normal for his age. He denies any breathlessness with normal activities. He denies orthopnea, PND, edema, chest pain, or chest pressure. He bruises easily but hasn't had any significant bleeding. He is currently taking both rivaroxaban and apixaban.  Past Medical History  Diagnosis Date  . Diabetes mellitus with neuropathy ([place])   . Hyperlipidemia   . CAD (coronary artery disease)   . DJD (degenerative joint disease)   . Anxiety   . Erectile dysfunction   . Colon polyp     Cscope at [facility] aprox 2006, repeated 2010 (-), next 5 years  . Eye muscle paralysis     congenital  . Skin cancer     sees derm  . Diaphragm paralysis     R, per pt   . Mild cognitive disorder     Unspecified mild NCD  . Heart palpitations   . Atrial  fibrillation, new onset ([place]) 03/27/2015    Past Surgical History  Procedure Laterality Date  . Carpal tunnel release      B, 2000  . Hip surgery      R, 1964, pins  . Knee surgery       L 2005  . Appendectomy    . Hip surgery      RIGHT--s/p hip replacement, s/p revision August 2009 due to dislocation x 3   . Medial partial knee replacement  08-2012    R  . Hip surgery  04-2012    R hip revision  . [facility]  [facility]    [facility]    Current Outpatient Prescriptions  Medication Sig Dispense Refill  . azelastine (ASTELIN) 137 MCG/SPRAY nasal spray Place 2 sprays into the nose 2 (two) times daily. Use in each nostril as directed 30 mL 1  . Cinnamon 500 MG capsule Take 500 mg by mouth 2 (two) times daily.     Marland Kitchen diltiazem (CARDIZEM CD) 120 MG 24 hr capsule Take 1 capsule (120 mg total) by mouth daily. 90 capsule 3  . FLUoxetine (PROZAC) 20 MG tablet Take 2 tablets (40 mg total) by mouth daily. 60 tablet 8  . furosemide (LASIX) 20 MG tablet Take 1 tablet (20 mg total) by mouth daily.    . insulin glargine (LANTUS SOLOSTAR) 100 UNIT/ML injection Inject 20 Units  into the skin at bedtime.     Marland Kitchen loratadine (CLARITIN) 10 MG tablet Take 10 mg by mouth every morning.     Marland Kitchen losartan (COZAAR) 100 MG tablet Take 1 tablet (100 mg total) by mouth daily.    . metFORMIN (GLUCOPHAGE) 1000 MG tablet Take 1 tablet (1,000 mg total) by mouth 2 (two) times daily with a meal.    . Multiple Vitamin (MULTIVITAMIN) tablet Take 1 tablet by mouth every morning.     Marland Kitchen omeprazole (PRILOSEC) 20 MG capsule Take 1 capsule (20 mg total) by mouth daily.    . rivaroxaban (XARELTO) 20 MG TABS tablet Take 1 tablet (20 mg total) by mouth daily with supper. 90 tablet 3  . simvastatin (ZOCOR) 40 MG tablet Take 40 mg by mouth.    . vitamin B-12 (CYANOCOBALAMIN) 250 MCG tablet Take 250 mcg by mouth daily.     No current facility-administered medications for this visit.    Allergies:   Pseudoephedrine    Social History:  The patient  reports that he has quit smoking. He has never used smokeless tobacco. He reports that he drinks alcohol. He reports that he does not use illicit drugs.   Family History:  The patient's  family history includes Diabetes in his maternal grandmother. There is no history of Heart attack, Stroke, or Hypertension.    ROS:  Please see the history of present illness.  Otherwise, review of systems is positive for Left hip pain, constipation, easy bruising.  All other systems are reviewed and negative.    PHYSICAL EXAM: VS:  BP 130/62 mmHg  Pulse 64  Ht 6' 6.5" (1.994 m)  Wt 243 lb 6.4 oz (110.406 kg)  BMI 27.77 kg/m2 , BMI Body mass index is 27.77 kg/(m^2). GEN: Well nourished, well developed, in no acute distress HEENT: normal Neck: no JVD, no masses. No carotid bruits Cardiac: RRR without murmur or gallop                Respiratory:  clear to auscultation bilaterally, normal work of breathing GI: soft, nontender, nondistended, + BS MS: no deformity or atrophy Ext: no pretibial edema, pedal pulses 2+= bilaterally Skin: warm and dry, no rash Neuro:  Strength and sensation are intact Psych: euthymic mood, full affect  EKG:  EKG is ordered today. The ekg ordered today shows sinus bradycardia 58 bpm, first-degree AV block, otherwise within normal limits.  Recent Labs: 03/27/2015: ALT 21; BUN 21*; Creatinine, Ser 0.94; Hemoglobin 15.6; Platelets 219; Potassium 4.6; Sodium 145; TSH 1.857   Lipid Panel     Component Value Date/Time   CHOL 145 07/15/2014   TRIG 39* 07/15/2014   HDL 60 07/15/2014   CHOLHDL 3 10/23/2013 0833   VLDL 4.4 10/23/2013 0833   LDLCALC 77 07/15/2014      Wt Readings from Last 3 Encounters:  07/01/15 243 lb 6.4 oz (110.406 kg)  04/07/15 239 lb (108.41 kg)  03/29/15 239 lb (108.41 kg)    Cardiac Studies Reviewed: 2-D echocardiogram 04/08/2015: Study Conclusions  - Left ventricle: The cavity size was normal. There was  moderate  concentric hypertrophy. Systolic function was vigorous. The  estimated ejection fraction was in the range of 65% to 70%. Wall  motion was normal; there were no regional wall motion  abnormalities. Doppler parameters are consistent with abnormal  left ventricular relaxation (grade 1 diastolic dysfunction).  Doppler parameters are consistent with elevated ventricular  end-diastolic filling pressure. - Aortic valve: Trileaflet; normal thickness leaflets. There  was no  regurgitation. - Aortic root: The aortic root was normal in size. - Mitral valve: Structurally normal valve. - Left atrium: The atrium was mildly dilated. - Right ventricle: The cavity size was normal. Wall thickness was  normal. Systolic function was normal. - Tricuspid valve: There was mild regurgitation. - Pulmonic valve: There was no regurgitation. - Pulmonary arteries: Systolic pressure was within the normal  range. - Inferior vena cava: The vessel was normal in size. - Pericardium, extracardiac: There was no pericardial effusion.  ASSESSMENT AND PLAN: 1.  Paroxysmal atrial fibrillation: The patient is maintaining sinus rhythm. He's had no further heart palpitations. He understands that he is taking 2 direct oral anticoagulant drugs and needs to stop 1. It sounds like he can have better access to rivaroxaban through [name] system and we recommended that he continue this. He was instructed to immediately stop taking apixaban. He will continue on current medications.  2. Preoperative evaluation: The patient is at low cardiac risk of complications for his upcoming hip surgery. He's had both knees and the contralateral hip replaced. He has not had problems with his surgical procedures. He can easily achieve greater than 4 metabolic equivalents without symptoms. No further testing is indicated from a cardiac perspective. He is instructed to hold Xarelto 72 hours prior to surgery.  3. Type 2 diabetes: Managed  by his primary care physician. He is treated with losartan and he takes insulin and metformin for glycemic control.  4. Hyperlipidemia: Treated with simvastatin. Followed by PCP.  Current medicines are reviewed with the patient today.  The patient does not have concerns regarding medicines.  Labs/ tests ordered today include:   Orders Placed This Encounter  Procedures  . EKG 12-Lead    Disposition:   FU PA/NP in 6 months, I will see back in one year unless problems arise.   Deatra James, MD  07/02/2015 8:29 AM    [facility] [address], [place], [place]  [address] Phone: [phone]; Fax: [phone]

## 2015-07-15 DIAGNOSIS — I440 Atrioventricular block, first degree: Secondary | ICD-10-CM | POA: Diagnosis not present

## 2015-07-15 DIAGNOSIS — M1612 Unilateral primary osteoarthritis, left hip: Secondary | ICD-10-CM | POA: Diagnosis not present

## 2015-07-15 DIAGNOSIS — R001 Bradycardia, unspecified: Secondary | ICD-10-CM | POA: Diagnosis not present

## 2015-07-19 ENCOUNTER — Telehealth: Payer: Self-pay | Admitting: Internal Medicine

## 2015-07-19 NOTE — Telephone Encounter (Signed)
Contacted Dr. Copper office notes faxed over to Dr. Francia Greaves office successfully.

## 2015-07-19 NOTE — Telephone Encounter (Signed)
Can not send OV notes from anyone other than our office. Pt will need to call Cardiology.

## 2015-07-19 NOTE — Telephone Encounter (Signed)
Relation to [id number] Call back number: [phone]   Reason for call:  Patient requesting office notes from Dr. Copper cardiology date of service 07/01/15 (which reflects in he's chart) please send to Dr. Joylene Grapes [facility] fax # [phone]

## 2015-08-09 DIAGNOSIS — E119 Type 2 diabetes mellitus without complications: Secondary | ICD-10-CM | POA: Diagnosis not present

## 2015-08-09 DIAGNOSIS — M25552 Pain in left hip: Secondary | ICD-10-CM | POA: Diagnosis not present

## 2015-08-09 DIAGNOSIS — I10 Essential (primary) hypertension: Secondary | ICD-10-CM | POA: Diagnosis not present

## 2015-08-09 DIAGNOSIS — G8918 Other acute postprocedural pain: Secondary | ICD-10-CM | POA: Diagnosis not present

## 2015-08-09 DIAGNOSIS — Z794 Long term (current) use of insulin: Secondary | ICD-10-CM | POA: Diagnosis not present

## 2015-08-09 DIAGNOSIS — Z87891 Personal history of nicotine dependence: Secondary | ICD-10-CM | POA: Diagnosis not present

## 2015-08-09 DIAGNOSIS — Z96642 Presence of left artificial hip joint: Secondary | ICD-10-CM | POA: Diagnosis not present

## 2015-08-09 DIAGNOSIS — R338 Other retention of urine: Secondary | ICD-10-CM | POA: Diagnosis not present

## 2015-08-09 DIAGNOSIS — M1612 Unilateral primary osteoarthritis, left hip: Secondary | ICD-10-CM | POA: Diagnosis not present

## 2015-08-09 DIAGNOSIS — K219 Gastro-esophageal reflux disease without esophagitis: Secondary | ICD-10-CM | POA: Diagnosis not present

## 2015-08-09 DIAGNOSIS — D62 Acute posthemorrhagic anemia: Secondary | ICD-10-CM | POA: Diagnosis not present

## 2015-08-09 HISTORY — PX: TOTAL HIP ARTHROPLASTY: SHX124

## 2015-08-12 ENCOUNTER — Emergency Department (HOSPITAL_BASED_OUTPATIENT_CLINIC_OR_DEPARTMENT_OTHER)
Admission: EM | Admit: 2015-08-12 | Discharge: 2015-08-12 | Disposition: A | Discharge status: Home / Self Care | Payer: Medicare Other | Attending: Emergency Medicine | Admitting: Emergency Medicine

## 2015-08-12 ENCOUNTER — Encounter (HOSPITAL_BASED_OUTPATIENT_CLINIC_OR_DEPARTMENT_OTHER): Payer: Self-pay | Admitting: *Deleted

## 2015-08-12 DIAGNOSIS — I2510 Atherosclerotic heart disease of native coronary artery without angina pectoris: Secondary | ICD-10-CM | POA: Insufficient documentation

## 2015-08-12 DIAGNOSIS — Z7982 Long term (current) use of aspirin: Secondary | ICD-10-CM | POA: Diagnosis not present

## 2015-08-12 DIAGNOSIS — T83098A Other mechanical complication of other indwelling urethral catheter, initial encounter: Secondary | ICD-10-CM | POA: Diagnosis not present

## 2015-08-12 DIAGNOSIS — Z85828 Personal history of other malignant neoplasm of skin: Secondary | ICD-10-CM | POA: Diagnosis not present

## 2015-08-12 DIAGNOSIS — M1990 Unspecified osteoarthritis, unspecified site: Secondary | ICD-10-CM | POA: Diagnosis not present

## 2015-08-12 DIAGNOSIS — E119 Type 2 diabetes mellitus without complications: Secondary | ICD-10-CM | POA: Diagnosis not present

## 2015-08-12 DIAGNOSIS — Z79899 Other long term (current) drug therapy: Secondary | ICD-10-CM | POA: Insufficient documentation

## 2015-08-12 DIAGNOSIS — I4891 Unspecified atrial fibrillation: Secondary | ICD-10-CM | POA: Insufficient documentation

## 2015-08-12 DIAGNOSIS — Y733 Surgical instruments, materials and gastroenterology and urology devices (including sutures) associated with adverse incidents: Secondary | ICD-10-CM | POA: Diagnosis not present

## 2015-08-12 DIAGNOSIS — Z7984 Long term (current) use of oral hypoglycemic drugs: Secondary | ICD-10-CM | POA: Diagnosis not present

## 2015-08-12 DIAGNOSIS — Z87891 Personal history of nicotine dependence: Secondary | ICD-10-CM | POA: Insufficient documentation

## 2015-08-12 DIAGNOSIS — T819XXA Unspecified complication of procedure, initial encounter: Secondary | ICD-10-CM | POA: Diagnosis not present

## 2015-08-12 DIAGNOSIS — Z9889 Other specified postprocedural states: Secondary | ICD-10-CM

## 2015-08-12 DIAGNOSIS — E785 Hyperlipidemia, unspecified: Secondary | ICD-10-CM | POA: Diagnosis not present

## 2015-08-12 NOTE — ED Notes (Signed)
Pt d/c home with family to drive 

## 2015-08-12 NOTE — Discharge Instructions (Signed)
There does not appear to be an emergent cause for your symptoms at this time. Your exam was very reassuring and you appear very well. Please follow-up with your surgeon and urologist for your regularly scheduled appointments on 5/9 and 5/10, respectively. Return to ED for any new or worsening symptoms as we discussed.

## 2015-08-12 NOTE — ED Notes (Signed)
Pt has had a foley since Tuesday after hip replacement for post-operative urinary retention

## 2015-08-12 NOTE — ED Provider Notes (Signed)
CSN: [id number]     Arrival date & time 08/12/15  [id number] History   First MD Initiated Contact with Patient 08/12/15 [phone]     Chief Complaint  Patient presents with  . Post-op Problem     (Consider location/radiation/quality/duration/timing/severity/associated sxs/prior Treatment) HPI Ivan Anderson is a 75 y.o. male history of ulnar artery disease, paroxysmal A. Fib on Xarelto therapy, insulin dependent diabetes, comes in for evaluation of bleeding around Foley catheter. Patient reports he had left hip surgery on 5/2 at [facility], was unable to void his bladder afterwards and had a Foley catheter placed on Wednesday. Wife reports this morning, while giving patient a bath, she noticed some blood around his urethral meatus, called his doctor and was instructed to go to an urgent care for evaluation. Came to ED for evaluation. Patient denies any fevers, chills, abdominal pain, penile pain, blood in his urine, back pain, foul urine smell. He has a follow-up appointment with his surgeon on 5/9 as well as a follow-up appointment with the urologist on 5/10. He denies any other medical problems or complaints at this time.  Past Medical History  Diagnosis Date  . Diabetes mellitus with neuropathy ([place])   . Hyperlipidemia   . CAD (coronary artery disease)   . DJD (degenerative joint disease)   . Anxiety   . Erectile dysfunction   . Colon polyp     Cscope at [facility] aprox 2006, repeated 2010 (-), next 5 years  . Eye muscle paralysis     congenital  . Skin cancer     sees derm  . Diaphragm paralysis     R, per pt   . Mild cognitive disorder     Unspecified mild NCD  . Heart palpitations   . Atrial fibrillation, new onset ([place]) 03/27/2015   Past Surgical History  Procedure Laterality Date  . Carpal tunnel release      B, 2000  . Hip surgery      R, 1964, pins  . Knee surgery       L 2005  . Appendectomy    . Hip surgery      RIGHT--s/p hip replacement, s/p revision August 2009 due to  dislocation x 3   . Medial partial knee replacement  08-2012    R  . Hip surgery  04-2012    R hip revision  . [facility]   Family History  Problem Relation Age of Onset  . Heart attack Neg Hx   . Stroke Neg Hx   . Diabetes Maternal Grandmother   . Hypertension Neg Hx    Social History  Substance Use Topics  . Smoking status: Former Research scientist (life sciences)  . Smokeless tobacco: Never Used     Comment: quit 1980s  . Alcohol Use: 0.0 oz/week    0 Standard drinks or equivalent per week     Comment: rarely     Review of Systems A 10 point review of systems was completed and was negative except for pertinent positives and negatives as mentioned in the history of present illness    Allergies  Pseudoephedrine  Home Medications   Prior to Admission medications   Medication Sig Start Date End Date Taking? Authorizing Provider  acetaminophen (TYLENOL) 500 MG tablet Take 1,000 mg by mouth every 6 (six) hours as needed.   Yes Historical Provider, MD  azelastine (ASTELIN) 137 MCG/SPRAY nasal spray Place 2 sprays into the nose 2 (two) times  daily. Use in each nostril as directed 05/20/12  Yes Colon Branch, MD  Cinnamon 500 MG capsule Take 500 mg by mouth 2 (two) times daily.    Yes Historical Provider, MD  diltiazem (CARDIZEM CD) 120 MG 24 hr capsule Take 1 capsule (120 mg total) by mouth daily. 04/07/15  Yes Almyra Deforest, PA  furosemide (LASIX) 20 MG tablet Take 1 tablet (20 mg total) by mouth daily. 12/02/14  Yes Colon Branch, MD  insulin glargine (LANTUS SOLOSTAR) 100 UNIT/ML injection Inject 20 Units into the skin at bedtime.    Yes Historical Provider, MD  loratadine (CLARITIN) 10 MG tablet Take 10 mg by mouth every morning.  12/02/14  Yes Colon Branch, MD  losartan (COZAAR) 25 MG tablet Take 25 mg by mouth daily.   Yes Historical Provider, MD  metFORMIN (GLUCOPHAGE) 1000 MG tablet Take 1 tablet (1,000 mg total) by mouth 2 (two) times daily with a meal. 12/02/14  Yes Colon Branch,  MD  Multiple Vitamin (MULTIVITAMIN) tablet Take 1 tablet by mouth every morning.  08/27/12  Yes Historical Provider, MD  omega-3 acid ethyl esters (LOVAZA) 1 g capsule Take by mouth 2 (two) times daily.   Yes Historical Provider, MD  omeprazole (PRILOSEC) 20 MG capsule Take 1 capsule (20 mg total) by mouth daily. 12/02/14  Yes Colon Branch, MD  oxycodone (OXY-IR) 5 MG capsule Take 5 mg by mouth every 4 (four) hours as needed.   Yes Historical Provider, MD  rivaroxaban (XARELTO) 20 MG TABS tablet Take 1 tablet (20 mg total) by mouth daily with supper. 04/07/15  Yes Almyra Deforest, PA  sennosides-docusate sodium (SENOKOT-S) 8.6-50 MG tablet Take 1 tablet by mouth daily.   Yes Historical Provider, MD  simvastatin (ZOCOR) 40 MG tablet Take 40 mg by mouth.   Yes Historical Provider, MD  vitamin B-12 (CYANOCOBALAMIN) 250 MCG tablet Take 250 mcg by mouth daily.   Yes Historical Provider, MD  FLUoxetine (PROZAC) 20 MG tablet Take 2 tablets (40 mg total) by mouth daily. 07/01/14   Colon Branch, MD  losartan (COZAAR) 100 MG tablet Take 1 tablet (100 mg total) by mouth daily. 12/02/14   Colon Branch, MD   BP 104/69 mmHg  Pulse 101  Temp(Src) 98.1 F (36.7 C) (Oral)  Resp 20  Ht 6\' 6"  (1.981 m)  Wt 108.863 kg  BMI 27.74 kg/m2  SpO2 98% Physical Exam  Constitutional: He is oriented to person, place, and time. He appears well-developed and well-nourished.  HENT:  Head: Normocephalic and atraumatic.  Mouth/Throat: Oropharynx is clear and moist.  Eyes: Conjunctivae are normal. Pupils are equal, round, and reactive to light. Right eye exhibits no discharge. Left eye exhibits no discharge. No scleral icterus.  Neck: Neck supple.  Cardiovascular: Normal rate, regular rhythm and normal heart sounds.   Pulmonary/Chest: Effort normal and breath sounds normal. No respiratory distress. He has no wheezes. He has no rales.  Abdominal: Soft. There is no tenderness.  Genitourinary:  Normal male GU exam. Foley is in place and  draining appropriately. No blood or trauma noted. No scrotal pain  Musculoskeletal: He exhibits no tenderness.  Neurological: He is alert and oriented to person, place, and time.  Cranial Nerves II-XII grossly intact  Skin: Skin is warm and dry. No rash noted.  Psychiatric: He has a normal mood and affect.  Nursing note and vitals reviewed.   ED Course  Procedures (including critical care time) Labs Review Labs Reviewed -  No data to display  Imaging Review No results found. I have personally reviewed and evaluated these images and lab results as part of my medical decision-making.   EKG Interpretation None      MDM  Ivan Anderson is a 75 y.o. male history of A. fib on Xarelto, CAD, insulin dependent diabetes, who presents for evaluation of possible bleeding from Foley catheter site. Patient had a catheter placed after hip surgery last week. He has a follow-up one with his surgeon and urologist on 5/9 and 5/10, respectively. His exam is reassuring, no bleeding on my exam. No abdominal pain and otherwise unremarkable physical exam. Foley appears to be draining well, no evidence of infection. Suspect some mild trauma versus irritation this morning. Patient overall appears very well, nontoxic and hemodynamically stable and appropriate for outpatient follow-up. Prior to patient discharge, I discussed and reviewed this case with Dr. Maryan Rued, who also saw and evaluated the patient and agrees with above plan.  Final diagnoses:  Post-operative state       Comer Locket, PA-C 08/12/15 1023  Blanchie Dessert, MD 08/12/15 1505

## 2015-08-15 ENCOUNTER — Ambulatory Visit: Payer: Medicare Other | Admitting: Cardiovascular Disease

## 2015-08-15 DIAGNOSIS — Z96643 Presence of artificial hip joint, bilateral: Secondary | ICD-10-CM | POA: Diagnosis not present

## 2015-08-15 DIAGNOSIS — W108XXA Fall (on) (from) other stairs and steps, initial encounter: Secondary | ICD-10-CM | POA: Diagnosis not present

## 2015-08-15 DIAGNOSIS — E119 Type 2 diabetes mellitus without complications: Secondary | ICD-10-CM | POA: Diagnosis not present

## 2015-08-15 DIAGNOSIS — Z794 Long term (current) use of insulin: Secondary | ICD-10-CM | POA: Diagnosis not present

## 2015-08-15 DIAGNOSIS — N9989 Other postprocedural complications and disorders of genitourinary system: Secondary | ICD-10-CM | POA: Diagnosis not present

## 2015-08-15 DIAGNOSIS — M533 Sacrococcygeal disorders, not elsewhere classified: Secondary | ICD-10-CM | POA: Diagnosis not present

## 2015-08-15 DIAGNOSIS — T148 Other injury of unspecified body region: Secondary | ICD-10-CM | POA: Diagnosis not present

## 2015-08-15 DIAGNOSIS — I2510 Atherosclerotic heart disease of native coronary artery without angina pectoris: Secondary | ICD-10-CM | POA: Diagnosis not present

## 2015-08-15 DIAGNOSIS — I10 Essential (primary) hypertension: Secondary | ICD-10-CM | POA: Diagnosis not present

## 2015-08-15 DIAGNOSIS — T1490 Injury, unspecified: Secondary | ICD-10-CM | POA: Diagnosis not present

## 2015-08-15 DIAGNOSIS — R338 Other retention of urine: Secondary | ICD-10-CM | POA: Diagnosis not present

## 2015-08-15 DIAGNOSIS — M25552 Pain in left hip: Secondary | ICD-10-CM | POA: Diagnosis not present

## 2015-08-15 DIAGNOSIS — Z87891 Personal history of nicotine dependence: Secondary | ICD-10-CM | POA: Diagnosis not present

## 2015-08-15 DIAGNOSIS — R339 Retention of urine, unspecified: Secondary | ICD-10-CM | POA: Diagnosis not present

## 2015-08-24 DIAGNOSIS — Z471 Aftercare following joint replacement surgery: Secondary | ICD-10-CM | POA: Diagnosis not present

## 2015-08-24 DIAGNOSIS — Z87891 Personal history of nicotine dependence: Secondary | ICD-10-CM | POA: Diagnosis not present

## 2015-08-24 DIAGNOSIS — Z96642 Presence of left artificial hip joint: Secondary | ICD-10-CM | POA: Diagnosis not present

## 2015-08-24 DIAGNOSIS — I4891 Unspecified atrial fibrillation: Secondary | ICD-10-CM | POA: Diagnosis not present

## 2015-08-24 DIAGNOSIS — E1165 Type 2 diabetes mellitus with hyperglycemia: Secondary | ICD-10-CM | POA: Diagnosis not present

## 2015-08-24 DIAGNOSIS — I2510 Atherosclerotic heart disease of native coronary artery without angina pectoris: Secondary | ICD-10-CM | POA: Diagnosis not present

## 2015-08-24 DIAGNOSIS — E785 Hyperlipidemia, unspecified: Secondary | ICD-10-CM | POA: Diagnosis not present

## 2015-08-24 DIAGNOSIS — I10 Essential (primary) hypertension: Secondary | ICD-10-CM | POA: Diagnosis not present

## 2015-09-07 DIAGNOSIS — R3129 Other microscopic hematuria: Secondary | ICD-10-CM | POA: Diagnosis not present

## 2015-09-07 DIAGNOSIS — Z0000 Encounter for general adult medical examination without abnormal findings: Secondary | ICD-10-CM | POA: Diagnosis not present

## 2015-09-07 DIAGNOSIS — R350 Frequency of micturition: Secondary | ICD-10-CM | POA: Diagnosis not present

## 2015-09-07 DIAGNOSIS — N3941 Urge incontinence: Secondary | ICD-10-CM | POA: Diagnosis not present

## 2015-09-16 DIAGNOSIS — M160 Bilateral primary osteoarthritis of hip: Secondary | ICD-10-CM | POA: Diagnosis not present

## 2015-09-16 DIAGNOSIS — I10 Essential (primary) hypertension: Secondary | ICD-10-CM | POA: Diagnosis not present

## 2015-09-16 DIAGNOSIS — Z8781 Personal history of (healed) traumatic fracture: Secondary | ICD-10-CM | POA: Diagnosis not present

## 2015-09-16 DIAGNOSIS — Z9049 Acquired absence of other specified parts of digestive tract: Secondary | ICD-10-CM | POA: Diagnosis not present

## 2015-09-16 DIAGNOSIS — S0990XA Unspecified injury of head, initial encounter: Secondary | ICD-10-CM | POA: Diagnosis not present

## 2015-09-16 DIAGNOSIS — Z9889 Other specified postprocedural states: Secondary | ICD-10-CM | POA: Diagnosis not present

## 2015-09-16 DIAGNOSIS — I4891 Unspecified atrial fibrillation: Secondary | ICD-10-CM | POA: Diagnosis not present

## 2015-09-16 DIAGNOSIS — I2510 Atherosclerotic heart disease of native coronary artery without angina pectoris: Secondary | ICD-10-CM | POA: Diagnosis not present

## 2015-09-16 DIAGNOSIS — I440 Atrioventricular block, first degree: Secondary | ICD-10-CM | POA: Diagnosis not present

## 2015-09-16 DIAGNOSIS — Z7901 Long term (current) use of anticoagulants: Secondary | ICD-10-CM | POA: Diagnosis not present

## 2015-09-16 DIAGNOSIS — Z8739 Personal history of other diseases of the musculoskeletal system and connective tissue: Secondary | ICD-10-CM | POA: Diagnosis not present

## 2015-09-16 DIAGNOSIS — Z96643 Presence of artificial hip joint, bilateral: Secondary | ICD-10-CM | POA: Diagnosis not present

## 2015-09-16 DIAGNOSIS — E119 Type 2 diabetes mellitus without complications: Secondary | ICD-10-CM | POA: Diagnosis not present

## 2015-09-16 DIAGNOSIS — S0083XA Contusion of other part of head, initial encounter: Secondary | ICD-10-CM | POA: Diagnosis not present

## 2015-09-16 DIAGNOSIS — M1612 Unilateral primary osteoarthritis, left hip: Secondary | ICD-10-CM | POA: Diagnosis not present

## 2015-09-16 DIAGNOSIS — Z87891 Personal history of nicotine dependence: Secondary | ICD-10-CM | POA: Diagnosis not present

## 2015-09-16 DIAGNOSIS — S098XXA Other specified injuries of head, initial encounter: Secondary | ICD-10-CM | POA: Diagnosis not present

## 2015-09-16 DIAGNOSIS — R001 Bradycardia, unspecified: Secondary | ICD-10-CM | POA: Diagnosis not present

## 2015-09-16 DIAGNOSIS — W19XXXA Unspecified fall, initial encounter: Secondary | ICD-10-CM | POA: Diagnosis not present

## 2015-09-16 DIAGNOSIS — Z96642 Presence of left artificial hip joint: Secondary | ICD-10-CM | POA: Diagnosis not present

## 2015-09-16 DIAGNOSIS — Z794 Long term (current) use of insulin: Secondary | ICD-10-CM | POA: Diagnosis not present

## 2015-09-17 DIAGNOSIS — I440 Atrioventricular block, first degree: Secondary | ICD-10-CM | POA: Diagnosis not present

## 2015-09-17 DIAGNOSIS — R001 Bradycardia, unspecified: Secondary | ICD-10-CM | POA: Diagnosis not present

## 2015-09-21 DIAGNOSIS — R3129 Other microscopic hematuria: Secondary | ICD-10-CM | POA: Diagnosis not present

## 2015-09-22 DIAGNOSIS — E119 Type 2 diabetes mellitus without complications: Secondary | ICD-10-CM | POA: Diagnosis not present

## 2015-09-22 DIAGNOSIS — Z96643 Presence of artificial hip joint, bilateral: Secondary | ICD-10-CM | POA: Diagnosis not present

## 2015-09-22 DIAGNOSIS — I4891 Unspecified atrial fibrillation: Secondary | ICD-10-CM | POA: Diagnosis not present

## 2015-09-22 DIAGNOSIS — Z96653 Presence of artificial knee joint, bilateral: Secondary | ICD-10-CM | POA: Diagnosis not present

## 2015-09-22 DIAGNOSIS — Z87891 Personal history of nicotine dependence: Secondary | ICD-10-CM | POA: Diagnosis not present

## 2015-09-22 DIAGNOSIS — Z471 Aftercare following joint replacement surgery: Secondary | ICD-10-CM | POA: Diagnosis not present

## 2015-09-22 DIAGNOSIS — Z96642 Presence of left artificial hip joint: Secondary | ICD-10-CM | POA: Diagnosis not present

## 2015-09-22 DIAGNOSIS — E785 Hyperlipidemia, unspecified: Secondary | ICD-10-CM | POA: Diagnosis not present

## 2015-09-22 DIAGNOSIS — I10 Essential (primary) hypertension: Secondary | ICD-10-CM | POA: Diagnosis not present

## 2015-09-22 DIAGNOSIS — M25552 Pain in left hip: Secondary | ICD-10-CM | POA: Diagnosis not present

## 2015-09-22 DIAGNOSIS — I2510 Atherosclerotic heart disease of native coronary artery without angina pectoris: Secondary | ICD-10-CM | POA: Diagnosis not present

## 2015-09-27 ENCOUNTER — Encounter: Payer: Self-pay | Admitting: Internal Medicine

## 2015-09-27 ENCOUNTER — Ambulatory Visit (INDEPENDENT_AMBULATORY_CARE_PROVIDER_SITE_OTHER): Payer: Medicare Other | Admitting: Internal Medicine

## 2015-09-27 VITALS — BP 128/66 | HR 61 | Temp 98.1°F | Ht 78.0 in | Wt 235.2 lb

## 2015-09-27 DIAGNOSIS — Z794 Long term (current) use of insulin: Secondary | ICD-10-CM

## 2015-09-27 DIAGNOSIS — I480 Paroxysmal atrial fibrillation: Secondary | ICD-10-CM | POA: Diagnosis not present

## 2015-09-27 DIAGNOSIS — E785 Hyperlipidemia, unspecified: Secondary | ICD-10-CM

## 2015-09-27 DIAGNOSIS — E1159 Type 2 diabetes mellitus with other circulatory complications: Secondary | ICD-10-CM | POA: Diagnosis not present

## 2015-09-27 NOTE — Patient Instructions (Signed)
  GO TO THE FRONT DESK Schedule your next appointment for a  Routine visit in 6-8 months

## 2015-09-27 NOTE — Assessment & Plan Note (Signed)
DM-continue Lantus 20 units daily and metformin. Follow-up at the [facility] Hyperlipidemia: Reports FLP was checked last month at [name] and was okay. Continue statins and Lovaza Atrial fibrillation: Seems in sinus rhythm today. Continue anticoagulation . DJD: Recovering well from a hip replacement. Good med compliance, labs from 09/16/2015 reviewed, stable, hemoglobin slightly low  (s/p hip replacement few weeks prior to labs) In [place], he is doing well, most of his chronic medical problems are follow-up elsewhere. No change in medications, RTC 6-8 months, call when necessary.

## 2015-09-27 NOTE — Progress Notes (Signed)
Pre visit review using our clinic review tool, if applicable. No additional management support is needed unless otherwise documented below in the visit note. 

## 2015-09-27 NOTE — Progress Notes (Signed)
Subjective:    Patient ID: Ivan Anderson, male    DOB: July 05, 1940, 75 y.o.   MRN: [id number]  DOS:  09/27/2015 Type of visit - description : rov Interval history: No major concerns, had hip surgery few weeks ago and tolerated the procedure well. HTN: Reports normal ambulatory BPs Diabetes: Managed elsewhere, denies any low blood sugar episodes Atrial fibrillation: Good med compliance, no symptoms High cholesterol: Reports he was checked last month at [name] and he was okay  labs 09/16/2015: Potassium 3.8, creatinine 0.7, hemoglobin 12.6  Review of Systems No chest pain, difficulty breathing No nausea, vomiting, diarrhea  Past Medical History  Diagnosis Date  . Diabetes mellitus with neuropathy ([place])   . Hyperlipidemia   . CAD (coronary artery disease)   . DJD (degenerative joint disease)   . Anxiety   . Erectile dysfunction   . Colon polyp     Cscope at [facility] aprox 2006, repeated 2010 (-), next 5 years  . Eye muscle paralysis     congenital  . Skin cancer     sees derm  . Diaphragm paralysis     R, per pt   . Mild cognitive disorder     Unspecified mild NCD  . Heart palpitations   . Atrial fibrillation, new onset ([place]) 03/27/2015  . Microscopic hematuria 2017    seeing urology    Past Surgical History  Procedure Laterality Date  . Carpal tunnel release      B, 2000  . Hip surgery      R, 1964, pins  . Knee surgery       L 2005  . Appendectomy    . Hip surgery  2009    RIGHT--s/p hip replacement, s/p revision August 2009 due to dislocation x 3   . Medial partial knee replacement  08-2012    R  . Hip surgery  04-2012, 2017    R hip revision  . [facility]  . [facility]  [facility]09-[facility]    Social History   Social History  . Marital Status: Married    Spouse Name: N/A  . Number of Children: N/A  . Years of Education: N/A   Occupational History  . realtor    Social History Main Topics  . Smoking  status: Former Research scientist (life sciences)  . Smokeless tobacco: Never Used     Comment: quit 1980s  . Alcohol Use: 0.0 oz/week    0 Standard drinks or equivalent per week     Comment: rarely   . Drug Use: No  . Sexual Activity: Not on file   Other Topics Concern  . Not on file   Social History Narrative        Medication List       This list is accurate as of: 09/27/15  5:11 PM.  Always use your most recent med list.               acetaminophen 500 MG tablet  Commonly known as:  TYLENOL  Take 1,000 mg by mouth every 6 (six) hours as needed.     azelastine 0.1 % nasal spray  Commonly known as:  ASTELIN  Place 2 sprays into the nose 2 (two) times daily. Use in each nostril as directed     Cinnamon 500 MG capsule  Take 500 mg by mouth 2 (two) times daily.     diltiazem 120 MG 24  hr capsule  Commonly known as:  CARDIZEM CD  Take 1 capsule (120 mg total) by mouth daily.     ELIQUIS 5 MG Tabs tablet  Generic drug:  apixaban  Take 5 mg by mouth 2 (two) times daily.     FLUoxetine 20 MG tablet  Commonly known as:  PROZAC  Take 2 tablets (40 mg total) by mouth daily.     furosemide 20 MG tablet  Commonly known as:  LASIX  Take 1 tablet (20 mg total) by mouth daily.     LANTUS SOLOSTAR 100 UNIT/ML injection  Generic drug:  insulin glargine  Inject 20 Units into the skin at bedtime.     loratadine 10 MG tablet  Commonly known as:  CLARITIN  Take 10 mg by mouth every morning.     losartan 100 MG tablet  Commonly known as:  COZAAR  Take 1 tablet (100 mg total) by mouth daily.     metFORMIN 1000 MG tablet  Commonly known as:  GLUCOPHAGE  Take 1 tablet (1,000 mg total) by mouth 2 (two) times daily with a meal.     multivitamin tablet  Take 1 tablet by mouth every morning.     omega-3 acid ethyl esters 1 g capsule  Commonly known as:  LOVAZA  Take by mouth 2 (two) times daily.     omeprazole 20 MG capsule  Commonly known as:  PRILOSEC  Take 1 capsule (20 mg total) by mouth  daily.     sennosides-docusate sodium 8.6-50 MG tablet  Commonly known as:  SENOKOT-S  Take 1 tablet by mouth daily.     simvastatin 40 MG tablet  Commonly known as:  ZOCOR  Take 40 mg by mouth.     vitamin B-12 250 MCG tablet  Commonly known as:  CYANOCOBALAMIN  Take 250 mcg by mouth daily.           Objective:   Physical Exam BP 128/66 mmHg  Pulse 61  Temp(Src) 98.1 F (36.7 C) (Oral)  Ht 6\' 6"  (1.981 m)  Wt 235 lb 4 oz (106.709 kg)  BMI 27.19 kg/m2  SpO2 97% General:   Well developed, well nourished . NAD.  HEENT:  Normocephalic . Face symmetric, atraumatic Lungs:  CTA B Normal respiratory effort, no intercostal retractions, no accessory muscle use. Heart: RRR,  no murmur.  No pretibial edema bilaterally  Skin: Not pale. Not jaundice Neurologic:  alert & oriented X3.  Speech normal, gait appropriate for age and history of DJD. unassisted Psych--  Cognition and judgment appear intact.  Cooperative with normal attention span and concentration.  Behavior appropriate. No anxious or depressed appearing.      Assessment & Plan:   Assessment -- seen regularly at [name], most chronic med issues f/u there DM with neuropathy  [facility] Anxiety CV: --CAD --A. Fib, new onset 03/27/2015, Dr Burt Knack DJD Skin cancer, sees dermatology Right diaphragmatic paralysis per patient Mild cognitive impairment, MMSE decreased from 53 to 20 on March 2016, Rxed Aricept, later dc b/c was retested at [facility] and was ok H/o eye muscle paralysis congenital  PLAN: DM-continue Lantus 20 units daily and metformin. Follow-up at the [facility] Hyperlipidemia: Reports FLP was checked last month at [name] and was okay. Continue statins and Lovaza Atrial fibrillation: Seems in sinus rhythm today. Continue anticoagulation . DJD: Recovering well from a hip replacement. Good med compliance, labs from 09/16/2015 reviewed, stable, hemoglobin slightly low  (s/p hip replacement few weeks  prior to labs) In [place], he is doing well, most of his chronic medical problems are follow-up elsewhere. No change in medications, RTC 6-8 months, call when necessary.

## 2015-10-06 DIAGNOSIS — R350 Frequency of micturition: Secondary | ICD-10-CM | POA: Diagnosis not present

## 2015-10-06 DIAGNOSIS — R3129 Other microscopic hematuria: Secondary | ICD-10-CM | POA: Diagnosis not present

## 2015-11-10 DIAGNOSIS — Z87891 Personal history of nicotine dependence: Secondary | ICD-10-CM | POA: Diagnosis not present

## 2015-11-10 DIAGNOSIS — Z96653 Presence of artificial knee joint, bilateral: Secondary | ICD-10-CM | POA: Diagnosis not present

## 2015-11-10 DIAGNOSIS — Z96642 Presence of left artificial hip joint: Secondary | ICD-10-CM | POA: Diagnosis not present

## 2015-11-10 DIAGNOSIS — I10 Essential (primary) hypertension: Secondary | ICD-10-CM | POA: Diagnosis not present

## 2015-11-10 DIAGNOSIS — I2510 Atherosclerotic heart disease of native coronary artery without angina pectoris: Secondary | ICD-10-CM | POA: Diagnosis not present

## 2015-11-10 DIAGNOSIS — E785 Hyperlipidemia, unspecified: Secondary | ICD-10-CM | POA: Diagnosis not present

## 2015-11-10 DIAGNOSIS — M8588 Other specified disorders of bone density and structure, other site: Secondary | ICD-10-CM | POA: Diagnosis not present

## 2015-11-10 DIAGNOSIS — E119 Type 2 diabetes mellitus without complications: Secondary | ICD-10-CM | POA: Diagnosis not present

## 2015-11-10 DIAGNOSIS — I4891 Unspecified atrial fibrillation: Secondary | ICD-10-CM | POA: Diagnosis not present

## 2015-11-10 DIAGNOSIS — Z471 Aftercare following joint replacement surgery: Secondary | ICD-10-CM | POA: Diagnosis not present

## 2015-11-25 ENCOUNTER — Encounter: Payer: Self-pay | Admitting: Nurse Practitioner

## 2015-11-30 ENCOUNTER — Telehealth: Payer: Self-pay | Admitting: Internal Medicine

## 2015-11-30 NOTE — Telephone Encounter (Signed)
Pt daughter called in to speak with PCP or CMA about getting her father (pt) tested for dementia.    Please advise.    Meriel FlavorsLelon Frohlich [phone]   Thanks.

## 2015-11-30 NOTE — Telephone Encounter (Signed)
I recommend a office visit for a examination, alternatively, he could be referred to [facility] neurology for an adult neurocognitive testing.

## 2015-11-30 NOTE — Telephone Encounter (Signed)
DPR on file reviewed, only person listed in Pt's spouse, [name]. Unfortunately, can't speak to her on Pt's behalf. However, can recommend they schedule 30 minute appt with Dr. Larose Kells to discuss. Thank you.

## 2015-11-30 NOTE — Telephone Encounter (Signed)
Noted! Thank you

## 2015-11-30 NOTE — Telephone Encounter (Signed)
FYI. Please advise, next scheduled OV is 04/2016.

## 2015-11-30 NOTE — Telephone Encounter (Signed)
Pt has been scheduled.  °

## 2015-12-08 ENCOUNTER — Ambulatory Visit (HOSPITAL_BASED_OUTPATIENT_CLINIC_OR_DEPARTMENT_OTHER)
Admission: RE | Admit: 2015-12-08 | Discharge: 2015-12-08 | Disposition: A | Discharge status: Home / Self Care | Payer: Medicare Other | Source: Ambulatory Visit | Attending: Internal Medicine | Admitting: Internal Medicine

## 2015-12-08 ENCOUNTER — Encounter: Payer: Self-pay | Admitting: Internal Medicine

## 2015-12-08 ENCOUNTER — Ambulatory Visit (INDEPENDENT_AMBULATORY_CARE_PROVIDER_SITE_OTHER): Payer: Medicare Other | Admitting: Internal Medicine

## 2015-12-08 VITALS — BP 132/62 | HR 59 | Temp 98.0°F | Resp 12 | Ht 78.0 in | Wt 242.4 lb

## 2015-12-08 DIAGNOSIS — R9082 White matter disease, unspecified: Secondary | ICD-10-CM | POA: Diagnosis not present

## 2015-12-08 DIAGNOSIS — S0990XA Unspecified injury of head, initial encounter: Secondary | ICD-10-CM | POA: Diagnosis not present

## 2015-12-08 DIAGNOSIS — R413 Other amnesia: Secondary | ICD-10-CM | POA: Insufficient documentation

## 2015-12-08 DIAGNOSIS — G3184 Mild cognitive impairment, so stated: Secondary | ICD-10-CM | POA: Diagnosis not present

## 2015-12-08 DIAGNOSIS — Z8673 Personal history of transient ischemic attack (TIA), and cerebral infarction without residual deficits: Secondary | ICD-10-CM | POA: Insufficient documentation

## 2015-12-08 DIAGNOSIS — G319 Degenerative disease of nervous system, unspecified: Secondary | ICD-10-CM | POA: Insufficient documentation

## 2015-12-08 LAB — VITAMIN B12: Vitamin B-12: 246 pg/mL (ref 211–911)

## 2015-12-08 LAB — FOLATE: Folate: 17.7 ng/mL (ref >5.9)

## 2015-12-08 LAB — SEDIMENTATION RATE: Sed Rate: 4 mm/hr (ref 0–20)

## 2015-12-08 NOTE — Progress Notes (Signed)
Pre visit review using our clinic review tool, if applicable. No additional management support is needed unless otherwise documented below in the visit note. 

## 2015-12-08 NOTE — Patient Instructions (Signed)
Next visit in 6 weeks for a follow-up  We are scheduling a CT of the head

## 2015-12-08 NOTE — Progress Notes (Signed)
Subjective:    Patient ID: Ivan Anderson, male    DOB: 22-Aug-1940, 75 y.o.   MRN: [id number]  DOS:  12/08/2015 Type of visit - description : Here with his wife and daughter a.m. to talk about memory Interval history: The patient reported that he has noted some problems with memory for the last few months, forgetting work tasks, has forgotten  to take his medications sometimes. The wife and daughter confirm this issues, they have actually noted problems only in the last few weeks. He admits to being emotional, but they have not noticed any impulsivity, irritability or aggressive behavior. When asked about a head injury, he did have a fall few weeks ago and landed on his face. No loss of consciousness.    Review of Systems  Denies any nausea, vomiting, diarrhea. No fever or chills CBGs are often low, sometimes in the 40s with symptoms. No anxiety or depression per se No headache, dizziness, slurred speech or motor deficits.  Past Medical History:  Diagnosis Date  . Anxiety   . Atrial fibrillation, new onset ([place]) 03/27/2015  . CAD (coronary artery disease)   . Colon polyp    Cscope at [facility] aprox 2006, repeated 2010 (-), next 5 years  . Diabetes mellitus with neuropathy ([place])   . Diaphragm paralysis    R, per pt   . DJD (degenerative joint disease)   . Erectile dysfunction   . Eye muscle paralysis    congenital  . Heart palpitations   . Hyperlipidemia   . Microscopic hematuria 2017   seeing urology  . Mild cognitive disorder    Unspecified mild NCD  . Skin cancer    sees derm    Past Surgical History:  Procedure Laterality Date  . APPENDECTOMY    . CARPAL TUNNEL RELEASE     B, 2000  . CYSTOSCOPY  2017  . HIP SURGERY Right 1964   pins  . HIP SURGERY  2009   RIGHT--s/p hip replacement, s/p revision August 2009 due to dislocation x 3   . HIP SURGERY  04-2012, 2017   R hip revision  . KNEE SURGERY Left 2005  . MEDIAL PARTIAL KNEE REPLACEMENT Right 08-2012  . TOTAL HIP  [facility]LASTY  08-09-15   [facility]    Social History   Social History  . Marital status: Married    Spouse name: N/A  . Number of children: N/A  . Years of education: N/A   Occupational History  . realtor Retired   Social History Main Topics  . Smoking status: Former Research scientist (life sciences)  . Smokeless tobacco: Never Used     Comment: quit 1980s  . Alcohol use 0.0 oz/week     Comment: rarely   . Drug use: No  . Sexual activity: Not on file   Other Topics Concern  . Not on file   Social History Narrative  . No narrative on file        Medication List       Accurate as of 12/08/15 11:59 PM. Always use your most recent med list.          acetaminophen 500 MG tablet Commonly known as:  TYLENOL Take 1,000 mg by mouth every 6 (six) hours as needed.   azelastine 0.1 % nasal spray Commonly known as:  ASTELIN Place 2 sprays into the nose 2 (two) times daily. Use in each nostril as directed   Cinnamon 500 MG capsule Take 500 mg by mouth 2 (two) times  daily.   diltiazem 120 MG 24 hr capsule Commonly known as:  CARDIZEM CD Take 1 capsule (120 mg total) by mouth daily.   ELIQUIS 5 MG Tabs tablet Generic drug:  apixaban Take 5 mg by mouth 2 (two) times daily.   FLUoxetine 20 MG tablet Commonly known as:  PROZAC Take 2 tablets (40 mg total) by mouth daily.   furosemide 20 MG tablet Commonly known as:  LASIX Take 1 tablet (20 mg total) by mouth daily.   LANTUS SOLOSTAR 100 UNIT/ML injection Generic drug:  insulin glargine Inject 20 Units into the skin at bedtime.   loratadine 10 MG tablet Commonly known as:  CLARITIN Take 10 mg by mouth every morning.   losartan 100 MG tablet Commonly known as:  COZAAR Take 1 tablet (100 mg total) by mouth daily.   metFORMIN 1000 MG tablet Commonly known as:  GLUCOPHAGE Take 1 tablet (1,000 mg total) by mouth 2 (two) times daily with a meal.   multivitamin tablet Take 1 tablet by mouth every morning.   omega-3 acid ethyl  esters 1 g capsule Commonly known as:  LOVAZA Take by mouth 2 (two) times daily.   omeprazole 20 MG capsule Commonly known as:  PRILOSEC Take 1 capsule (20 mg total) by mouth daily.   sennosides-docusate sodium 8.6-50 MG tablet Commonly known as:  SENOKOT-S Take 1 tablet by mouth daily.   simvastatin 40 MG tablet Commonly known as:  ZOCOR Take 40 mg by mouth.   vitamin B-12 250 MCG tablet Commonly known as:  CYANOCOBALAMIN Take 250 mcg by mouth daily.          Objective:   Physical Exam BP 132/62 (BP Location: Left Arm, Patient Position: Sitting, Cuff Size: Normal)   Pulse (!) 59   Temp 98 F (36.7 C) (Oral)   Resp 12   Ht 6\' 6"  (1.981 m)   Wt 242 lb 6 oz (109.9 kg)   SpO2 96%   BMI 28.01 kg/m  General:   Well developed, well nourished . NAD.  HEENT:  Normocephalic . Face symmetric, atraumatic Skin: Not pale. Not jaundice Neurologic:  alert & oriented X3.  Speech normal, gait appropriate for age and unassisted Psych--  Cognition and judgment appear intact.  Cooperative with normal attention span and concentration.  Behavior appropriate. No anxious or depressed appearing.  MMSE 27    Assessment & Plan:   Assessment -- seen regularly at [name], most chronic med issues f/u there DM with neuropathy  [facility] Anxiety CV: --CAD --A. Fib, new onset 03/27/2015, Dr Burt Knack DJD Skin cancer, sees dermatology Right diaphragmatic paralysis per patient Mild cognitive impairment, MMSE decreased from 41 to 20 on March 2016, Rxed Aricept, later dc b/c was retested at [facility] and was ok H/o eye muscle paralysis congenital  PLAN: Mild cognitive impairment: The patient has certainly sx c/w  memory deficits, MMSE scored 27 which is okay. Was seen with similar sxs 06-2014, rx  Aricept, I'm not sure if he took it.. Given the discordance see between sx and the testing, I will refer him to neuropsychology for a more detailed exam. We'll check [id number], folic acid,  sedimentation rate and RPR. He is "forgetfulness" could be due to low blood sugars, recommend to decrease insulin under the guidance of his [place] doctors; his A1c usually runs 6.0. We'll get a CT head given recent fall and head injury and now memory symptoms. RTC 6 weeks, likely will benefit from medication  Is  pain with the patient more than 30 minutes doing a detailed mental exam and answering a number of questions from him and  his family

## 2015-12-09 ENCOUNTER — Encounter: Payer: Self-pay | Admitting: Nurse Practitioner

## 2015-12-09 ENCOUNTER — Ambulatory Visit (INDEPENDENT_AMBULATORY_CARE_PROVIDER_SITE_OTHER): Payer: Medicare Other | Admitting: Nurse Practitioner

## 2015-12-09 VITALS — BP 160/70 | HR 61 | Ht 78.0 in | Wt 241.1 lb

## 2015-12-09 DIAGNOSIS — I480 Paroxysmal atrial fibrillation: Secondary | ICD-10-CM | POA: Diagnosis not present

## 2015-12-09 DIAGNOSIS — Z7901 Long term (current) use of anticoagulants: Secondary | ICD-10-CM | POA: Diagnosis not present

## 2015-12-09 NOTE — Progress Notes (Signed)
CARDIOLOGY OFFICE NOTE  Date:  12/09/2015    [name] Date of Birth: [birth year] Medical Record [id number]  PCP:  [name], MD  Cardiologist:  Burt Knack    Chief Complaint  Patient presents with  . Atrial Fibrillation    6 month check - seen for Dr. Burt Knack    History of Present Illness: Ivan Anderson is a 75 y.o. male who presents today for a follow up visit. Seen for Dr. Burt Knack.   He has a history of atrial fibrillation. The patient was hospitalized in December 2016 with weakness and heart palpitations and was found to be in atrial fibrillation with rapid ventricular response. The patient was treated with Cardizem and anticoagulated with Xarelto. He converted back to sinus rhythm spontaneously. He was initially seen in our outpatient practice 04/07/2015 by Almyra Deforest, PA-C. The patient's CHADS-Vasc is 3. At that time he was in sinus rhythm and felt to be stable. He was tolerating Xarelto without bleeding problems. An echocardiogram was performed. This shows vigorous LV systolic function, moderate LVH, no significant valvular disease, and a mildly dilated left atrium. Other issues as noted below.   Last seen back in March and was doing well but was noted to be taking both Xarelto and Eliquis.   Comes in today. Here alone. Very talkative. He continues to work in Personal assistant. Says his heart is doing ok. No chest pain. Not short of breath. Rhythm ok. Hip replacement back in May - complicated by hematuria - seeing GU. This may have been due to being on both Eliquis and Xarelto. He interchanges these two several times during our visit but says he is just now on Eliquis. Seeing the [place] next month with labs. He is happy with how he is doing.   Past Medical History:  Diagnosis Date  . Anxiety   . Atrial fibrillation, new onset ([place]) 03/27/2015  . CAD (coronary artery disease)   . Colon polyp    Cscope at [facility] aprox 2006, repeated 2010 (-), next 5 years  . Diabetes mellitus with neuropathy  ([place])   . Diaphragm paralysis    R, per pt   . DJD (degenerative joint disease)   . Erectile dysfunction   . Eye muscle paralysis    congenital  . Heart palpitations   . Hyperlipidemia   . Microscopic hematuria 2017   seeing urology  . Mild cognitive disorder    Unspecified mild NCD  . Skin cancer    sees derm    Past Surgical History:  Procedure Laterality Date  . APPENDECTOMY    . CARPAL TUNNEL RELEASE     B, 2000  . CYSTOSCOPY  2017  . HIP SURGERY Right 1964   pins  . HIP SURGERY  2009   RIGHT--s/p hip replacement, s/p revision August 2009 due to dislocation x 3   . HIP SURGERY  04-2012, 2017   R hip revision  . KNEE SURGERY Left 2005  . MEDIAL PARTIAL KNEE REPLACEMENT Right 08-2012  . TOTAL HIP ARTHROPLASTY  08-09-15   [facility]     Medications: Current Outpatient Prescriptions  Medication Sig Dispense Refill  . acetaminophen (TYLENOL) 500 MG tablet Take 1,000 mg by mouth every 6 (six) hours as needed.    Marland Kitchen apixaban (ELIQUIS) 5 MG TABS tablet Take 5 mg by mouth 2 (two) times daily.    Marland Kitchen azelastine (ASTELIN) 137 MCG/SPRAY nasal spray Place 2 sprays into the nose 2 (two) times daily. Use  in each nostril as directed 30 mL 1  . Cinnamon 500 MG capsule Take 500 mg by mouth 2 (two) times daily.     Marland Kitchen diltiazem (CARDIZEM CD) 120 MG 24 hr capsule Take 120 mg by mouth daily.    Marland Kitchen FLUoxetine (PROZAC) 20 MG tablet Take 2 tablets (40 mg total) by mouth daily. 60 tablet 8  . furosemide (LASIX) 20 MG tablet Take 1 tablet (20 mg total) by mouth daily.    . insulin glargine (LANTUS SOLOSTAR) 100 UNIT/ML injection Inject 20 Units into the skin at bedtime.     Marland Kitchen loratadine (CLARITIN) 10 MG tablet Take 10 mg by mouth every morning.     Marland Kitchen losartan (COZAAR) 100 MG tablet Take 1 tablet (100 mg total) by mouth daily.    . metFORMIN (GLUCOPHAGE) 1000 MG tablet Take 1 tablet (1,000 mg total) by mouth 2 (two) times daily with a meal.    . Multiple Vitamin (MULTIVITAMIN) tablet Take 1  tablet by mouth every morning.     Marland Kitchen omega-3 acid ethyl esters (LOVAZA) 1 g capsule Take by mouth 2 (two) times daily.    Marland Kitchen omeprazole (PRILOSEC) 20 MG capsule Take 1 capsule (20 mg total) by mouth daily.    . sennosides-docusate sodium (SENOKOT-S) 8.6-50 MG tablet Take 1 tablet by mouth daily.    . simvastatin (ZOCOR) 40 MG tablet Take 40 mg by mouth.    . vitamin B-12 (CYANOCOBALAMIN) 250 MCG tablet Take 250 mcg by mouth daily.     No current facility-administered medications for this visit.     Allergies: Allergies  Allergen Reactions  . Pseudoephedrine Other (See Comments)    Dx: A. Fib.     Social History: The patient  reports that he has quit smoking. He has never used smokeless tobacco. He reports that he drinks alcohol. He reports that he does not use drugs.   Family History: The patient's family history includes Diabetes in his maternal grandmother.   Review of Systems: Please see the history of present illness.   Otherwise, the review of systems is positive for frequent urination.   All other systems are reviewed and negative.   Physical Exam: VS:  BP (!) 160/70   Pulse 61   Ht 6\' 6"  (1.981 m)   Wt 241 lb 1.9 oz (109.4 kg)   SpO2 97% Comment: at rest  BMI 27.86 kg/m  .  BMI Body mass index is 27.86 kg/m.  Wt Readings from Last 3 Encounters:  12/09/15 241 lb 1.9 oz (109.4 kg)  12/08/15 242 lb 6 oz (109.9 kg)  09/27/15 235 lb 4 oz (106.7 kg)   BP by me is 130/70  General: Pleasant. Very talkative. Well developed, well nourished and in no acute distress.   HEENT: Normal.  Neck: Supple, no JVD, carotid bruits, or masses noted.  Cardiac: Regular rate and rhythm. No murmurs, rubs, or gallops. No edema.  Respiratory:  Lungs are clear to auscultation bilaterally with normal work of breathing.  GI: Soft and nontender.  MS: No deformity or atrophy. Gait and ROM intact.  Skin: Warm and dry. Color is normal.  Neuro:  Strength and sensation are intact and no gross  focal deficits noted.  Psych: Alert, appropriate and with normal affect.   LABORATORY DATA:  EKG:  EKG is not ordered today.  Lab Results  Component Value Date   WBC 7.4 03/27/2015   HGB 15.6 03/27/2015   HCT 45.6 03/27/2015   PLT 219 03/27/2015  GLUCOSE 193 (H) 03/27/2015   CHOL 145 07/15/2014   TRIG 39 (A) 07/15/2014   HDL 60 07/15/2014   LDLCALC 77 07/15/2014   ALT 21 03/27/2015   AST 22 03/27/2015   NA 145 03/27/2015   K 4.6 03/27/2015   CL 107 03/27/2015   CREATININE 0.94 03/27/2015   BUN 21 (H) 03/27/2015   CO2 27 03/27/2015   TSH 1.857 03/27/2015   PSA 1.17 02/08/2009   HGBA1C 6.1 (A) 07/15/2014   MICROALBUR 1.8 06/24/2008    BNP (last 3 results) No results for input(s): BNP in the last 8760 hours.  ProBNP (last 3 results) No results for input(s): PROBNP in the last 8760 hours.   Other Studies Reviewed Today:  2-D echocardiogram 04/08/2015: Study Conclusions  - Left ventricle: The cavity size was normal. There was moderate  concentric hypertrophy. Systolic function was vigorous. The  estimated ejection fraction was in the range of 65% to 70%. Wall  motion was normal; there were no regional wall motion  abnormalities. Doppler parameters are consistent with abnormal  left ventricular relaxation (grade 1 diastolic dysfunction).  Doppler parameters are consistent with elevated ventricular  end-diastolic filling pressure. - Aortic valve: Trileaflet; normal thickness leaflets. There was no  regurgitation. - Aortic root: The aortic root was normal in size. - Mitral valve: Structurally normal valve. - Left atrium: The atrium was mildly dilated. - Right ventricle: The cavity size was normal. Wall thickness was  normal. Systolic function was normal. - Tricuspid valve: There was mild regurgitation. - Pulmonic valve: There was no regurgitation. - Pulmonary arteries: Systolic pressure was within the normal  range. - Inferior vena cava: The vessel  was normal in size. - Pericardium, extracardiac: There was no pericardial effusion.  ASSESSMENT AND PLAN: 1.  Paroxysmal atrial fibrillation: Remains in NSR by exam today. Remains on anticoagulation with Eliquis.   2. HTN - repeat BP by me is ok. No change with his current regimen  3. Type 2 diabetes: Managed by his primary care physician. He is treated with losartan and he takes insulin and metformin for glycemic control.  4. Hyperlipidemia: Treated with simvastatin. Labs by [name]. Have asked him to have the [place] send [place] a copy.   5. Chronic anticoagulation - no problems noted - while he does interchange the Xarelto and Eliquis - he says he is only taking Eliquis.   Current medicines are reviewed with the patient today.  The patient does not have concerns regarding medicines other than what has been noted above.  The following changes have been made:  See above.  Labs/ tests ordered today include:   No orders of the defined types were placed in this encounter.    Disposition:   FU with Dr. Burt Knack in 6 months.    Patient is agreeable to this plan and will call if any problems develop in the interim.   Signed: Burtis Junes, RN, ANP-C 12/09/2015 1:51 PM  [facility] [address] [place], [place]  [address] Phone: [phone] Fax: [phone]

## 2015-12-09 NOTE — Assessment & Plan Note (Signed)
Mild cognitive impairment: The patient has certainly sx c/w  memory deficits, MMSE scored 27 which is okay. Was seen with similar sxs 06-2014, rx  Aricept, I'm not sure if he took it.. Given the discordance see between sx and the testing, I will refer him to neuropsychology for a more detailed exam. We'll check 123456, folic acid, sedimentation rate and RPR. He is "forgetfulness" could be due to low blood sugars, recommend to decrease insulin under the guidance of his [place] doctors; his A1c usually runs 6.0. We'll get a CT head given recent fall and head injury and now memory symptoms. RTC 6 weeks, likely will benefit from medication

## 2015-12-09 NOTE — Patient Instructions (Addendum)
We will be checking the following labs today - NONE   Medication Instructions:    Continue with your current medicines.     Testing/Procedures To Be Arranged:  N/A  Follow-Up:   See Dr. Burt Knack in 6 months  [facility]urt Knack your labs at next month's visit    Other Special Instructions:   N/A    If you need a refill on your cardiac medications before your next appointment, please call your pharmacy.   Call the [place] office at [phone] if you have any questions, problems or concerns.

## 2015-12-17 ENCOUNTER — Emergency Department (HOSPITAL_COMMUNITY): Payer: Medicare Other

## 2015-12-17 ENCOUNTER — Emergency Department (HOSPITAL_COMMUNITY)
Admission: EM | Admit: 2015-12-17 | Discharge: 2015-12-17 | Disposition: A | Discharge status: Home / Self Care | Payer: Medicare Other | Attending: Emergency Medicine | Admitting: Emergency Medicine

## 2015-12-17 ENCOUNTER — Encounter (HOSPITAL_COMMUNITY): Payer: Self-pay | Admitting: Emergency Medicine

## 2015-12-17 DIAGNOSIS — Z794 Long term (current) use of insulin: Secondary | ICD-10-CM | POA: Diagnosis not present

## 2015-12-17 DIAGNOSIS — Z7984 Long term (current) use of oral hypoglycemic drugs: Secondary | ICD-10-CM | POA: Insufficient documentation

## 2015-12-17 DIAGNOSIS — I2510 Atherosclerotic heart disease of native coronary artery without angina pectoris: Secondary | ICD-10-CM | POA: Diagnosis not present

## 2015-12-17 DIAGNOSIS — G459 Transient cerebral ischemic attack, unspecified: Secondary | ICD-10-CM

## 2015-12-17 DIAGNOSIS — E1140 Type 2 diabetes mellitus with diabetic neuropathy, unspecified: Secondary | ICD-10-CM | POA: Diagnosis not present

## 2015-12-17 DIAGNOSIS — R410 Disorientation, unspecified: Secondary | ICD-10-CM | POA: Diagnosis not present

## 2015-12-17 DIAGNOSIS — Z85828 Personal history of other malignant neoplasm of skin: Secondary | ICD-10-CM | POA: Diagnosis not present

## 2015-12-17 DIAGNOSIS — R4182 Altered mental status, unspecified: Secondary | ICD-10-CM | POA: Diagnosis present

## 2015-12-17 DIAGNOSIS — R404 Transient alteration of awareness: Secondary | ICD-10-CM

## 2015-12-17 DIAGNOSIS — Z87891 Personal history of nicotine dependence: Secondary | ICD-10-CM | POA: Insufficient documentation

## 2015-12-17 DIAGNOSIS — Z96641 Presence of right artificial hip joint: Secondary | ICD-10-CM | POA: Diagnosis not present

## 2015-12-17 DIAGNOSIS — Z7901 Long term (current) use of anticoagulants: Secondary | ICD-10-CM | POA: Insufficient documentation

## 2015-12-17 DIAGNOSIS — R531 Weakness: Secondary | ICD-10-CM | POA: Diagnosis not present

## 2015-12-17 LAB — I-STAT TROPONIN, ED: Troponin i, poc: 0.01 ng/mL (ref 0.00–0.08)

## 2015-12-17 LAB — CBC WITH DIFFERENTIAL/PLATELET
Basophils Absolute: 0 10*3/uL (ref 0.0–0.1)
Basophils Relative: 1 %
EOS ABS: 0 10*3/uL (ref 0.0–0.7)
EOS PCT: 0 %
HCT: 44 % (ref 39.0–52.0)
HEMOGLOBIN: 14.7 g/dL (ref 13.0–17.0)
LYMPHS ABS: 0.2 10*3/uL — AB (ref 0.7–4.0)
Lymphocytes Relative: 3 %
MCH: 30.3 pg (ref 26.0–34.0)
MCHC: 33.4 g/dL (ref 30.0–36.0)
MCV: 90.7 fL (ref 78.0–100.0)
MONO ABS: 0.3 10*3/uL (ref 0.1–1.0)
MONOS PCT: 6 %
Neutro Abs: 4.3 10*3/uL (ref 1.7–7.7)
Neutrophils Relative %: 90 %
PLATELETS: 150 10*3/uL (ref 150–400)
RBC: 4.85 MIL/uL (ref 4.22–5.81)
RDW: 13.7 % (ref 11.5–15.5)
WBC: 4.8 10*3/uL (ref 4.0–10.5)

## 2015-12-17 LAB — CBG MONITORING, ED: Glucose-Capillary: 98 mg/dL (ref 65–99)

## 2015-12-17 LAB — COMPREHENSIVE METABOLIC PANEL
ALK PHOS: 85 U/L (ref 38–126)
ALT: 37 U/L (ref 17–63)
ANION GAP: 8 (ref 5–15)
AST: 34 U/L (ref 15–41)
Albumin: 3.9 g/dL (ref 3.5–5.0)
BUN: 14 mg/dL (ref 6–20)
CALCIUM: 9.5 mg/dL (ref 8.9–10.3)
CO2: 26 mmol/L (ref 22–32)
Chloride: 103 mmol/L (ref 101–111)
Creatinine, Ser: 0.69 mg/dL (ref 0.61–1.24)
GFR calc non Af Amer: >60 mL/min (ref >60)
Glucose, Bld: 102 mg/dL — ABNORMAL HIGH (ref 65–99)
POTASSIUM: 3.6 mmol/L (ref 3.5–5.1)
SODIUM: 137 mmol/L (ref 135–145)
Total Bilirubin: 1.3 mg/dL — ABNORMAL HIGH (ref 0.3–1.2)
Total Protein: 6.6 g/dL (ref 6.5–8.1)

## 2015-12-17 LAB — I-STAT CG4 LACTIC ACID, ED
LACTIC ACID, VENOUS: 1.87 mmol/L (ref 0.5–1.9)
Lactic Acid, Venous: 1.08 mmol/L (ref 0.5–1.9)

## 2015-12-17 LAB — URINALYSIS, ROUTINE W REFLEX MICROSCOPIC
BILIRUBIN URINE: NEGATIVE
Glucose, UA: NEGATIVE mg/dL
Hgb urine dipstick: NEGATIVE
KETONES UR: NEGATIVE mg/dL
Leukocytes, UA: NEGATIVE
NITRITE: NEGATIVE
Protein, ur: NEGATIVE mg/dL
SPECIFIC GRAVITY, URINE: 1.013 (ref 1.005–1.030)
pH: 6 (ref 5.0–8.0)

## 2015-12-17 NOTE — ED Notes (Signed)
Patient transported to MRI 

## 2015-12-17 NOTE — ED Provider Notes (Addendum)
[place] DEPT Provider Note   CSN: [id number] Arrival date & time: 12/17/15  0904     History   Chief Complaint Chief Complaint  Patient presents with  . Weakness  . Chest Pain  . Altered Mental Status    HPI Ivan Anderson is a 75 y.o. male who presents for AMS. The patient and his wife give the history and are good historians. They both got flu vaccinations yesterday. The patient states that Some time later he began having chest pain and back pain. He states that it was so severe he thought he was going to pass out. He states that it lasted about 20 minutes and then resolved. His wife states that later that day he be came confused. He wasn't sure how to get around his house and checked his blood sugar twice. His wife states that he didn't even know where he was. She states that he has had some mild cognitive decreased since his last hip surgery in May which they have been trying to evaluate with her PCP.  The patient states that they were later at the homeless shelter doing volunteer work when he started feeling really bad. She found her husband in the car, stating that he was very close to the heat turned on. Patient had 4 episodes of urinary incontinence last night and also noticed that he had some blood in his urine. The patient is on a Eliquis. He has noticed black stools, He states, ever since his surgery. His wife has also noticed that he has had a decline in both his mental status as well as his physical conditioning since his hip surgery. She notices that sometimes he has difficulty standing up from a seated position and also has difficulty walking at times. The patient states that his balance is worse than it used to be. His wife is sometimes noticed a shuffling gait.   Denies fevers, myalgias, arthralgias. Denies DOE, SOB, chest tightness or pressure, radiation to left arm, jaw or back, or diaphoresis. Denies dysuria, flank pain, suprapubic pain, frequency, urgency, or hematuria.  Denies headaches, light headedness,  UL weakness, visual disturbances. Denies abdominal pain, nausea, vomiting, diarrhea or constipation.   HPI  Past Medical History:  Diagnosis Date  . Anxiety   . Atrial fibrillation, new onset ([place]) 03/27/2015  . CAD (coronary artery disease)   . Colon polyp    Cscope at [facility] aprox 2006, repeated 2010 (-), next 5 years  . Diabetes mellitus with neuropathy ([place])   . Diaphragm paralysis    R, per pt   . DJD (degenerative joint disease)   . Erectile dysfunction   . Eye muscle paralysis    congenital  . Heart palpitations   . Hyperlipidemia   . Microscopic hematuria 2017   seeing urology  . Mild cognitive disorder    Unspecified mild NCD  . Skin cancer    sees derm    Patient Active Problem List   Diagnosis Date Noted  . PCP NOTES >>>>>> 03/29/2015  . Atrial fibrillation ([place]) 03/27/2015  . 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 Mild cognitive impairment?  12/23/2013  . TRIGGER FINGER, LEFT MIDDLE 06/08/2010  . HAND PAIN, LEFT 06/08/2010  . ANXIETY 12/19/2006  . 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 Dyslipidemia 10/01/2006  . Diabetes ([place]) 05/04/2006  . ERECTILE DYSFUNCTION 05/04/2006  . HYPERTENSION 05/04/2006  . CORONARY ARTERY DISEASE 05/04/2006  . OSTEOARTHRITIS 05/04/2006  . COLONIC POLYPS, HX OF 05/04/2006  Past Surgical History:  Procedure Laterality Date  . APPENDECTOMY    . CARPAL TUNNEL RELEASE     B, 2000  . CYSTOSCOPY  2017  . HIP SURGERY Right 1964   pins  . HIP SURGERY  2009   RIGHT--s/p hip replacement, s/p revision August 2009 due to dislocation x 3   . HIP SURGERY  04-2012, 2017   R hip revision  . KNEE SURGERY Left 2005  . MEDIAL PARTIAL KNEE REPLACEMENT Right 08-2012  . TOTAL HIP ARTHROPLASTY  08-09-15   [facility]       Home Medications    Prior to Admission medications   Medication Sig Start Date End Date Taking? Authorizing Provider  acetaminophen (TYLENOL) 500 MG  tablet Take 1,000 mg by mouth every 6 (six) hours as needed.   Yes Historical Provider, MD  apixaban (ELIQUIS) 5 MG TABS tablet Take 5 mg by mouth 2 (two) times daily.   Yes Historical Provider, MD  Cinnamon 500 MG capsule Take 500 mg by mouth 2 (two) times daily.    Yes Historical Provider, MD  FLUoxetine (PROZAC) 20 MG tablet Take 2 tablets (40 mg total) by mouth daily. 07/01/14  Yes Colon Branch, MD  furosemide (LASIX) 20 MG tablet Take 1 tablet (20 mg total) by mouth daily. 12/02/14  Yes Colon Branch, MD  insulin glargine (LANTUS SOLOSTAR) 100 UNIT/ML injection Inject 20 Units into the skin at bedtime.    Yes Historical Provider, MD  loratadine (CLARITIN) 10 MG tablet Take 10 mg by mouth every morning.  12/02/14  Yes Colon Branch, MD  losartan (COZAAR) 100 MG tablet Take 1 tablet (100 mg total) by mouth daily. 12/02/14  Yes Colon Branch, MD  metFORMIN (GLUCOPHAGE) 1000 MG tablet Take 1 tablet (1,000 mg total) by mouth 2 (two) times daily with a meal. 12/02/14  Yes Colon Branch, MD  Multiple Vitamin (MULTIVITAMIN) tablet Take 1 tablet by mouth every morning.  08/27/12  Yes Historical Provider, MD  Multiple Vitamins-Minerals (OCUVITE ADULT FORMULA PO) Take 1 capsule by mouth daily.   Yes Historical Provider, MD  naproxen sodium (ALEVE) 220 MG tablet Take 440 mg by mouth 2 (two) times daily as needed.   Yes Historical Provider, MD  omega-3 acid ethyl esters (LOVAZA) 1 g capsule Take 2 g by mouth 2 (two) times daily.    Yes Historical Provider, MD  omeprazole (PRILOSEC) 20 MG capsule Take 1 capsule (20 mg total) by mouth daily. 12/02/14  Yes Colon Branch, MD  sennosides-docusate sodium (SENOKOT-S) 8.6-50 MG tablet Take 1 tablet by mouth daily.   Yes Historical Provider, MD  simvastatin (ZOCOR) 40 MG tablet Take 40 mg by mouth daily.    Yes Historical Provider, MD  vitamin B-12 (CYANOCOBALAMIN) 250 MCG tablet Take 250 mcg by mouth daily.   Yes Historical Provider, MD    Family History Family History  Problem  Relation Age of Onset  . Diabetes Maternal Grandmother   . Heart attack Neg Hx   . Stroke Neg Hx   . Hypertension Neg Hx     Social History Social History  Substance Use Topics  . Smoking status: Former Research scientist (life sciences)  . Smokeless tobacco: Never Used     Comment: quit 1980s  . Alcohol use 0.0 oz/week     Comment: rarely      Allergies   Pseudoephedrine   Review of Systems Review of Systems  Ten systems reviewed and are negative for acute change,  except as noted in the HPI.    Physical Exam Updated Vital Signs BP 137/68   Pulse 65   Temp 97.7 F (36.5 C) (Oral)   Resp 18   Ht 6\' 6"  (1.981 m)   Wt 109.3 kg   SpO2 100%   BMI 27.85 kg/m   Physical Exam  Constitutional: He is oriented to person, place, and time. He appears well-developed and well-nourished. No distress.  HENT:  Head: Normocephalic and atraumatic.  Eyes: Conjunctivae and EOM are normal. Pupils are equal, round, and reactive to light. No scleral icterus.  Neck: Normal range of motion. Neck supple.  Cardiovascular: Normal rate, regular rhythm and normal heart sounds.   Pulmonary/Chest: Effort normal and breath sounds normal. No respiratory distress.  Abdominal: Soft. Bowel sounds are normal. There is no tenderness.  Musculoskeletal: He exhibits no edema.  Neurological: He is alert and oriented to person, place, and time. He displays normal reflexes. No cranial nerve deficit. Coordination normal. GCS eye subscore is 4. GCS verbal subscore is 5. GCS motor subscore is 6.  Skin: Skin is warm and dry. He is not diaphoretic.  Psychiatric: His behavior is normal.  Nursing note and vitals reviewed.    ED Treatments / Results  Labs (all labs ordered are listed, but only abnormal results are displayed) Labs Reviewed  CBC WITH DIFFERENTIAL/PLATELET - Abnormal; Notable for the following:       Result Value   Lymphs Abs 0.2 (*)    All other components within normal limits  COMPREHENSIVE METABOLIC PANEL -  Abnormal; Notable for the following:    Glucose, Bld 102 (*)    Total Bilirubin 1.3 (*)    All other components within normal limits  URINALYSIS, ROUTINE W REFLEX MICROSCOPIC (NOT AT [facility])  CBG MONITORING, ED  I-STAT TROPOININ, ED  I-STAT CG4 LACTIC ACID, ED  I-STAT CG4 LACTIC ACID, ED    EKG  EKG Interpretation None       Radiology Dg Chest 2 View  Result Date: 12/17/2015 CLINICAL DATA:  Confusion through 2 weeks. EXAM: CHEST  2 VIEW COMPARISON:  Radiograph October 12, 2006. FINDINGS: The heart size and mediastinal contours are within normal limits. Both lungs are clear. No pneumothorax or pleural effusion is noted. The visualized skeletal structures are unremarkable. IMPRESSION: No active cardiopulmonary disease. Electronically Signed   By: Marijo Conception, M.D.   On: 12/17/2015 10:56   Mr Brain Wo Contrast  Result Date: 12/17/2015 CLINICAL DATA:  New onset weakness in confusion yesterday associated with chest pain. Urinary incontinence is new. EXAM: MRI HEAD WITHOUT CONTRAST TECHNIQUE: Multiplanar, multiecho pulse sequences of the brain and surrounding structures were obtained without intravenous contrast. COMPARISON:  None. FINDINGS: Brain: The diffusion-weighted images demonstrate no evidence for acute or subacute infarction. Advanced atrophy and diffuse white matter disease is present bilaterally. The ventricles are proportionate to the degree of atrophy. A remote lacunar infarct is present in the left caudate head and left thalamus. Dilated perivascular spaces are present throughout the basal ganglia bilaterally. White matter changes extend into the brainstem. The cerebellum is within normal limits. The internal auditory canals are within normal limits bilaterally. Vascular: Flow is present in the major intracranial arteries. Skull and upper cervical spine: The skullbase is within normal limits. Intracranial midline sagittal structures are within normal limits. Degenerative changes are  present in the upper cervical spine. No significant central canal stenosis is present. Sinuses/Orbits: The paranasal sinuses and mastoid air cells are clear. The globes and  orbits are intact. Other: IMPRESSION: 1. No acute intracranial abnormality. 2. Advanced generalized atrophy and diffuse white matter disease. This likely reflects the sequela of chronic microvascular ischemia. 3. 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 Electronically Signed   By: San Morelle M.D.   On: 12/17/2015 13:12    Procedures Procedures (including critical care time)  Medications Ordered in ED Medications - No data to display   Initial Impression / Assessment and Plan / ED Course  I have reviewed the triage vital signs and the nursing notes.  Pertinent labs & imaging results that were available during my care of the patient were reviewed by me and considered in my medical decision making (see chart for details).  Clinical Course   Patient with transient confusion.  His labs are without significant abnormality. The patient's CT is negative.  . I feel the patient may nave had immune response to the vaccination giving him the sxs of chills and malaise. Given his progressive cognitive decline, this could represent sundowning. I cannot find a medical reason. Dr Lita Mains has seen the patient and has recommended an MRI which also shows no acute abnormality. Given the patients cognitive  Changes and intermittent weakness with gait disturbance, I have concern for possible diagnosis of parkinsons. I have discussed this with the patient, and his wife and daughter. I have referred the patient to OP neurology. The patient appears safe for discharge at this time . I have discussed return precautions with the patient .  Final Clinical Impressions(s) / ED Diagnoses   Final diagnoses:  Altered awareness, transient    New Prescriptions Discharge Medication List as of 12/17/2015  2:14 PM       Margarita Mail, PA-C 12/18/15 0735    Julianne Rice, MD 12/21/15 [place], PA-C 02/18/16 0750    Julianne Rice, MD 02/21/16 [phone]

## 2015-12-17 NOTE — ED Triage Notes (Signed)
Wife stated, he started being weak and confused and yesterday and had some chest pain.  Yesterday he had urinated in his pants like 4 times and he has never done this before.

## 2015-12-17 NOTE — Discharge Instructions (Signed)
Return immediately for any new or worsening symptoms

## 2015-12-17 NOTE — ED Notes (Signed)
Patient transported to X-ray 

## 2015-12-19 ENCOUNTER — Encounter: Payer: Self-pay | Admitting: Psychology

## 2015-12-19 ENCOUNTER — Ambulatory Visit (INDEPENDENT_AMBULATORY_CARE_PROVIDER_SITE_OTHER): Payer: Medicare Other | Admitting: Psychology

## 2015-12-19 ENCOUNTER — Telehealth: Payer: Self-pay | Admitting: Internal Medicine

## 2015-12-19 DIAGNOSIS — R413 Other amnesia: Secondary | ICD-10-CM
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
## 2015-12-19 NOTE — Telephone Encounter (Signed)
Went to the ER with a constellation of symptoms, please call the patient, schedule a visit with me for a check up  early next week but sooner if he has any worrisome symptoms.

## 2015-12-20 NOTE — Telephone Encounter (Signed)
Called pt and left message to return call.

## 2015-12-20 NOTE — Telephone Encounter (Signed)
Pt reports he is feeling well and has no new or concerning symptoms. He is meeting w/ neuropsych and had first appt yesterday. Follow-up w/ PCP scheduled 12/27/15 and pt was instructed to schedule appt sooner if any changes in symptoms.

## 2015-12-22 ENCOUNTER — Ambulatory Visit (INDEPENDENT_AMBULATORY_CARE_PROVIDER_SITE_OTHER): Payer: Medicare Other | Admitting: Psychology

## 2015-12-22 DIAGNOSIS — R413 Other amnesia: Secondary | ICD-10-CM | POA: Diagnosis not present

## 2015-12-22 NOTE — Progress Notes (Signed)
   Neuropsychology Note  [name] returned today for 2 hours of neuropsychological testing with technician, Milana Kidney, BS, under the supervision of Dr. Macarthur Critchley. The patient did not appear overtly distressed by the testing session, per behavioral observation or via self-report to the technician. Rest breaks were offered. Ivan Anderson will return within 2 weeks for a feedback session with Dr. Si Raider at which time his test performances, clinical impressions and treatment recommendations will be reviewed in detail. The patient understands he can contact our office should he require our assistance before this time.  Full report to follow.

## 2015-12-26 ENCOUNTER — Encounter: Payer: Medicare Other | Admitting: Psychology

## 2015-12-27 ENCOUNTER — Ambulatory Visit (INDEPENDENT_AMBULATORY_CARE_PROVIDER_SITE_OTHER): Payer: Medicare Other | Admitting: Internal Medicine

## 2015-12-27 ENCOUNTER — Encounter: Payer: Self-pay | Admitting: Internal Medicine

## 2015-12-27 VITALS — BP 128/68 | HR 77 | Temp 97.7°F | Resp 12 | Ht 78.0 in | Wt 241.0 lb

## 2015-12-27 DIAGNOSIS — K921 Melena: Secondary | ICD-10-CM | POA: Diagnosis not present

## 2015-12-27 DIAGNOSIS — R351 Nocturia: Secondary | ICD-10-CM | POA: Diagnosis not present

## 2015-12-27 DIAGNOSIS — R410 Disorientation, unspecified: Secondary | ICD-10-CM

## 2015-12-27 DIAGNOSIS — E1159 Type 2 diabetes mellitus with other circulatory complications: Secondary | ICD-10-CM

## 2015-12-27 DIAGNOSIS — Z09 Encounter for follow-up examination after completed treatment for conditions other than malignant neoplasm: Secondary | ICD-10-CM

## 2015-12-27 DIAGNOSIS — Z794 Long term (current) use of insulin: Secondary | ICD-10-CM

## 2015-12-27 LAB — CBC WITH DIFFERENTIAL/PLATELET
Basophils Absolute: 0 10*3/uL (ref 0.0–0.1)
Basophils Relative: 0.3 % (ref 0.0–3.0)
EOS PCT: 1 % (ref 0.0–5.0)
Eosinophils Absolute: 0.1 10*3/uL (ref 0.0–0.7)
HEMATOCRIT: 42.1 % (ref 39.0–52.0)
Hemoglobin: 14.2 g/dL (ref 13.0–17.0)
LYMPHS ABS: 1.2 10*3/uL (ref 0.7–4.0)
LYMPHS PCT: 14.3 % (ref 12.0–46.0)
MCHC: 33.6 g/dL (ref 30.0–36.0)
MCV: 90.9 fl (ref 78.0–100.0)
MONOS PCT: 7.2 % (ref 3.0–12.0)
Monocytes Absolute: 0.6 10*3/uL (ref 0.1–1.0)
NEUTROS ABS: 6.4 10*3/uL (ref 1.4–7.7)
NEUTROS PCT: 77.2 % — AB (ref 43.0–77.0)
PLATELETS: 285 10*3/uL (ref 150.0–400.0)
RBC: 4.63 Mil/uL (ref 4.22–5.81)
RDW: 14.7 % (ref 11.5–15.5)
WBC: 8.3 10*3/uL (ref 4.0–10.5)

## 2015-12-27 LAB — URINALYSIS, ROUTINE W REFLEX MICROSCOPIC
Bilirubin Urine: NEGATIVE
Hgb urine dipstick: NEGATIVE
Leukocytes, UA: NEGATIVE
Nitrite: NEGATIVE
PH: 6 (ref 5.0–8.0)
SPECIFIC GRAVITY, URINE: 1.02 (ref 1.000–1.030)
TOTAL PROTEIN, URINE-UPE24: NEGATIVE
Urine Glucose: NEGATIVE
Urobilinogen, UA: 1 (ref 0.0–1.0)

## 2015-12-27 LAB — HEMOCCULT GUIAC POC 1CARD (OFFICE): FECAL OCCULT BLD: NEGATIVE

## 2015-12-27 LAB — PSA: PSA: 0.87 ng/mL (ref 0.10–4.00)

## 2015-12-27 LAB — SEDIMENTATION RATE: Sed Rate: 5 mm/hr (ref 0–20)
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
## 2015-12-27 NOTE — Assessment & Plan Note (Addendum)
Mental status changes, acute, in the context of mild cognitive impairment: MRI was negative went he was seen at the ER, he is improving but not back to baseline. No obvious triggers (no apparent infex, stroke, etc) Refer to neurology. Check sedimentation rate Mild cognitive impairment: Neuropsychological testing pending Diabetes: Too tight sugar control may affecting his mental status, decrease Lantus to 15 units Melena?: Digital rectal exam showed dark brown stools, Hemoccult negative. Will check a CBC to be sure he is not bleeding and stop Aleve. Nocturia: DRE show a normal size prostate with no tenderness. Will check UA, urine culture and PSA (?prostatitis) RTC depending on results.

## 2015-12-27 NOTE — Patient Instructions (Addendum)
GO TO THE LAB : Get the blood work      Lantus: Decreased to 15 units daily  No more Aleve, Tylenol is okay  Go to the ER or call if you have more problems with confusion

## 2015-12-27 NOTE — Progress Notes (Signed)
Pre visit review using our clinic review tool, if applicable. No additional management support is needed unless otherwise documented below in the visit note. 
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
## 2015-12-28 LAB — URINE CULTURE: ORGANISM ID, BACTERIA: NO GROWTH

## 2015-12-29 ENCOUNTER — Ambulatory Visit (INDEPENDENT_AMBULATORY_CARE_PROVIDER_SITE_OTHER): Payer: Medicare Other | Admitting: Psychology

## 2015-12-29 ENCOUNTER — Encounter: Payer: Self-pay | Admitting: Psychology

## 2015-12-29 DIAGNOSIS — F0150 Vascular dementia without behavioral disturbance: Secondary | ICD-10-CM

## 2015-12-29 DIAGNOSIS — I6782 Cerebral ischemia: Secondary | ICD-10-CM
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
## 2016-01-03 ENCOUNTER — Encounter: Payer: Self-pay | Admitting: Neurology

## 2016-01-03 ENCOUNTER — Ambulatory Visit (INDEPENDENT_AMBULATORY_CARE_PROVIDER_SITE_OTHER): Payer: Medicare Other | Admitting: Neurology

## 2016-01-03 VITALS — BP 122/60 | HR 66 | Ht 78.0 in | Wt 236.0 lb

## 2016-01-03 DIAGNOSIS — G214 Vascular parkinsonism: Secondary | ICD-10-CM | POA: Diagnosis not present

## 2016-01-03 DIAGNOSIS — E1142 Type 2 diabetes mellitus with diabetic polyneuropathy: Secondary | ICD-10-CM | POA: Diagnosis not present

## 2016-01-03 MED ORDER — DONEPEZIL HCL 10 MG PO TABS: 10.0000 mg | ORAL_TABLET | Freq: Every day | ORAL | 1 refills | Status: DC

## 2016-01-03 MED ORDER — DONEPEZIL HCL 5 MG PO TABS: 5.0000 mg | ORAL_TABLET | Freq: Every day | ORAL | 0 refills | Status: DC

## 2016-01-03 NOTE — Patient Instructions (Signed)
1. Start Aricept 5 mg tablets - take one daily for one month. Prescription sent to [facility].  After one month increase to Aricept 10 mg tablets - take one daily. Prescription sent to [facility].   2. Follow up in 6 months.

## 2016-01-16 ENCOUNTER — Encounter: Payer: Self-pay | Admitting: Internal Medicine

## 2016-01-16 ENCOUNTER — Ambulatory Visit (INDEPENDENT_AMBULATORY_CARE_PROVIDER_SITE_OTHER): Payer: Medicare Other | Admitting: Internal Medicine

## 2016-01-16 VITALS — BP 113/66 | HR 66 | Temp 98.2°F | Resp 16 | Ht >= 80 in | Wt 237.0 lb

## 2016-01-16 DIAGNOSIS — E1159 Type 2 diabetes mellitus with other circulatory complications: Secondary | ICD-10-CM

## 2016-01-16 DIAGNOSIS — Z794 Long term (current) use of insulin: Secondary | ICD-10-CM

## 2016-01-16 DIAGNOSIS — R413 Other amnesia: Secondary | ICD-10-CM | POA: Diagnosis not present

## 2016-01-16 MED ORDER — INSULIN GLARGINE 100 UNIT/ML SOLOSTAR PEN: 15.0000 [IU] | PEN_INJECTOR | Freq: Every day | SUBCUTANEOUS | 2 refills | Status: DC

## 2016-01-16 MED ORDER — INSULIN PEN NEEDLE 31G X 5 MM MISC: 5 refills | Status: DC

## 2016-01-16 MED ORDER — MEMANTINE HCL 5 MG PO TABS: 5.0000 mg | ORAL_TABLET | Freq: Two times a day (BID) | ORAL | 5 refills | Status: DC

## 2016-01-16 NOTE — Progress Notes (Signed)
Subjective:    Patient ID: Ivan Anderson, male    DOB: 04/03/1941, 75 y.o.   MRN: [id number]  DOS:  01/16/2016 Type of visit - description : Here with his wife and daughter Interval history: Since the last time, he was seen by psychology and neurology. Notes  reviewed. Was prescribed Donepezil, took it for 3 days, developed diarrhea, he already stopped. No other side effects. Has decrease insulin to 15 units daily, CBGs mostly below 200 with occasional excursions in the 240s.  Review of Systems  Had questionable melena at last OV: Resolved Nocturia: Improved  Past Medical History:  Diagnosis Date  . Anxiety   . Atrial fibrillation, new onset ([place]) 03/27/2015  . CAD (coronary artery disease)   . Colon polyp    Cscope at [facility] aprox 2006, repeated 2010 (-), next 5 years  . Diabetes mellitus with neuropathy ([place])   . Diaphragm paralysis    R, per pt   . DJD (degenerative joint disease)   . Erectile dysfunction   . Eye muscle paralysis    congenital, lateral rectus, left  . Heart palpitations   . Hyperlipidemia   . Microscopic hematuria 2017   seeing urology  . Mild cognitive disorder    Unspecified mild NCD  . Skin cancer    sees derm    Past Surgical History:  Procedure Laterality Date  . ANKLE FUSION Right   . APPENDECTOMY    . CARPAL TUNNEL RELEASE     B, 2000  . CYSTOSCOPY  2017  . HIP SURGERY Right 1964   pins  . HIP SURGERY  2009   RIGHT--s/p hip replacement, s/p revision August 2009 due to dislocation x 3   . HIP SURGERY  04-2012, 2017   R hip revision  . KNEE SURGERY Left 2005  . MEDIAL PARTIAL KNEE REPLACEMENT Right 08-2012  . TOTAL HIP ARTHROPLASTY  08-09-15   [facility]    Social History   Social History  . Marital status: Married    Spouse name: N/A  . Number of children: N/A  . 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 Smoking status: Former Smoker    Quit date: 01/02/1981  . Smokeless  tobacco: Never Used     Comment: quit 1980s  . Alcohol use No  . Drug use: No  . Sexual activity: Not on file   Other Topics Concern  . Not on file   Social History Narrative  . No narrative on file        Medication List       Accurate as of 01/16/16  9:40 AM. Always use your most recent med list.          acetaminophen 500 MG tablet Commonly known as:  TYLENOL Take 1,000 mg by mouth every 6 (six) hours as needed.   Cinnamon 500 MG capsule Take 500 mg by mouth 2 (two) times daily.   diltiazem 120 MG 24 hr capsule Commonly known as:  DILACOR XR Take 120 mg by mouth daily.   donepezil 5 MG tablet Commonly known as:  ARICEPT Take 1 tablet (5 mg total) by mouth at bedtime.   donepezil 10 MG tablet Commonly known as:  ARICEPT Take 1 tablet (10 mg total) by mouth at bedtime.   ELIQUIS 5 MG Tabs tablet Generic drug:  apixaban Take 5 mg by mouth 2 (two) times daily.   FLUoxetine 20  MG tablet Commonly known as:  PROZAC Take 2 tablets (40 mg total) by mouth daily.   furosemide 20 MG tablet Commonly known as:  LASIX Take 1 tablet (20 mg total) by mouth daily.   LANTUS SOLOSTAR 100 UNIT/ML Solostar Pen Generic drug:  Insulin Glargine Inject 15 Units into the skin daily at 10 pm.   loratadine 10 MG tablet Commonly known as:  CLARITIN Take 10 mg by mouth every morning.   losartan 100 MG tablet Commonly known as:  COZAAR Take 1 tablet (100 mg total) by mouth daily.   metFORMIN 1000 MG tablet Commonly known as:  GLUCOPHAGE Take 1 tablet (1,000 mg total) by mouth 2 (two) times daily with a meal.   multivitamin tablet Take 1 tablet by mouth every morning.   OCUVITE ADULT FORMULA PO Take 1 capsule by mouth daily.   omega-3 acid ethyl esters 1 g capsule Commonly known as:  LOVAZA Take 2 g by mouth 2 (two) times daily.   omeprazole 20 MG capsule Commonly known as:  PRILOSEC Take 1 capsule (20 mg total) by mouth daily.   sennosides-docusate sodium 8.6-50  MG tablet Commonly known as:  SENOKOT-S Take 1 tablet by mouth daily.   simvastatin 40 MG tablet Commonly known as:  ZOCOR Take 40 mg by mouth daily.   vitamin B-12 250 MCG tablet Commonly known as:  CYANOCOBALAMIN Take 250 mcg by mouth daily.          Objective:   Physical Exam BP 113/66 (BP Location: Right Arm, Patient Position: Sitting, Cuff Size: Normal)   Pulse 66   Temp 98.2 F (36.8 C) (Oral)   Resp 16   Ht 6\' 8"  (2.032 m)   Wt 237 lb (107.5 kg)   SpO2 96%   BMI 26.04 kg/m  General:   Well developed, well nourished . NAD.  HEENT:  Normocephalic . Face symmetric, atraumatic Neurologic:  Alert. Speech normal, gait appropriate for age and unassisted Psych--  Cooperative with normal attention span and concentration.  Behavior appropriate. No anxious or depressed appearing.      Assessment & Plan:   Assessment -- seen regularly at [name], most chronic med issues f/u there DM with neuropathy  -- [facility] Anxiety Gait disorder, multifactorial  CV: --CAD --A. Fib, new onset 03/27/2015, Dr Burt Knack DJD Skin cancer, sees dermatology Right diaphragmatic paralysis per patient Mild cognitive impairment:  MMSE decreased from 27 to 20 on March 2016, formal psychologically eval 12-2015. Rx not driving, family to provide medications  H/o eye muscle paralysis congenital  PLAN:  Cognitive impairment: 12/29/2015: Had formal psychological evaluation and diagnosed with mild dementia , executive dysfunction, confusion, they recommend family to take over medication administration and no driving.Family is starting to take over medications, he is reluctantly stop driving. Intolerant to Denezepil, rx a trial w/ Namenda today. Also, wife reports he sometimes has a temper. Will monitor the situation.Patient is counseled . Diabetes: Sugars increased as expected after insulin dose decreased to prevent hypoglycemia. Will switch Lantus to the pen to facilitate  administration. Was recently seen with question of melena (resolved) nocturia (improved) RTC 3 months

## 2016-01-16 NOTE — Patient Instructions (Addendum)
  GO TO THE FRONT DESK Schedule your next appointment for a  routine checkup in 3 months   Start Namenda 5 mg: 1 tablet daily for 10 days, then one tablet twice a day

## 2016-01-16 NOTE — Assessment & Plan Note (Signed)
Cognitive impairment: 12/29/2015: Had formal psychological evaluation and diagnosed with mild dementia , executive dysfunction, confusion, they recommend family to take over medication administration and no driving.Family is starting to take over medications, he is reluctantly stop driving. Intolerant to Denezepil, rx a trial w/ Namenda today. Also, wife reports he sometimes has a temper. Will monitor the situation.Patient is counseled . Diabetes: Sugars increased as expected after insulin dose decreased to prevent hypoglycemia. Will switch Lantus to the pen to facilitate administration. Was recently seen with question of melena (resolved) nocturia (improved) RTC 3 months

## 2016-01-16 NOTE — Progress Notes (Signed)
Pre visit review using our clinic review tool, if applicable. No additional management support is needed unless otherwise documented below in the visit note. 

## 2016-04-11 ENCOUNTER — Ambulatory Visit (INDEPENDENT_AMBULATORY_CARE_PROVIDER_SITE_OTHER): Payer: Medicare Other | Admitting: Internal Medicine

## 2016-04-11 ENCOUNTER — Encounter: Payer: Self-pay | Admitting: Internal Medicine

## 2016-04-11 VITALS — BP 132/68 | HR 49 | Temp 98.2°F | Resp 14 | Ht >= 80 in | Wt 242.0 lb

## 2016-04-11 DIAGNOSIS — R413 Other amnesia: Secondary | ICD-10-CM | POA: Diagnosis not present

## 2016-04-11 NOTE — Progress Notes (Signed)
Subjective:    Patient ID: Ivan Anderson, male    DOB: February 22, 1941, 76 y.o.   MRN: [id number]  DOS:  04/11/2016 Type of visit - description : Follow-up, here with his wife Interval history: Here for a follow-up regards memory impairment, wife reports that he is doing in general well,  he is not doing any driving. Developed diarrhea with Namenda and consequently stopped.    Review of Systems No anxiety or depression per patient, wife confirms. She did mention that due to his retirement he is "restless" and trying to do things all the time. No wandering  Past Medical History:  Diagnosis Date  . Anxiety   . Atrial fibrillation, new onset ([place]) 03/27/2015  . CAD (coronary artery disease)   . Colon polyp    [facility]scope at [facility] aprox [facility], repeated 2010 (-), next 5 years  . Diabetes mellitus with neuropathy ([place])   . Diaphragm paralysis    R, per pt   . DJD (degenerative joint disease)   . Erectile dysfunction   . Eye muscle paralysis    congenital, lateral rectus, left  . Heart palpitations   . Hyperlipidemia   . Microscopic hematuria 2017   seeing urology  . Mild cognitive disorder    Unspecified mild NCD  . Skin cancer    sees derm    Past Surgical History:  Procedure Laterality Date  . ANKLE FUSION Right   . APPENDECTOMY    . CARPAL TUNNEL RELEASE     B, 2000  . CYSTOSCOPY  2017  . HIP SURGERY Right 1964   pins  . HIP SURGERY  2009   RIGHT--s/p hip replacement, s/p revision August 2009 due to dislocation x 3   . HIP SURGERY  04-2012, 2017   R hip revision  . KNEE SURGERY Left 2005  . MEDIAL PARTIAL KNEE REPLACEMENT Right 08-2012  . TOTAL HIP ARTHROPLASTY  08-09-15   [facility]    Social History   Social History  . Marital status: Married    Spouse name: N/A  . Number of children: N/A  . 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 Smoking status: Former Smoker    Quit date: 01/02/1981  . Smokeless tobacco:  Never Used     Comment: quit 1980s  . Alcohol use No  . Drug use: No  . Sexual activity: Not on file   Other Topics Concern  . Not on file   Social History Narrative  . No narrative on file      Allergies as of 04/11/2016      Reactions   Pseudoephedrine Other (See Comments)   Dx: A. Fib.       Medication List       Accurate as of 04/11/16 11:59 PM. Always use your most recent med list.          acetaminophen 500 MG tablet Commonly known as:  TYLENOL Take 1,000 mg by mouth every 6 (six) hours as needed.   Cinnamon 500 MG capsule Take 500 mg by mouth 2 (two) times daily.   diltiazem 120 MG 24 hr capsule Commonly known as:  DILACOR XR Take 120 mg by mouth daily.   ELIQUIS 5 MG Tabs tablet Generic drug:  apixaban Take 5 mg by mouth 2 (two) times daily.   FLUoxetine 20 MG tablet Commonly known as:  PROZAC Take 2 tablets (40 mg total) by mouth daily.  furosemide 20 MG tablet Commonly known as:  LASIX Take 1 tablet (20 mg total) by mouth daily.   Insulin Glargine 100 UNIT/ML Solostar Pen Commonly known as:  LANTUS SOLOSTAR Inject 15 Units into the skin daily at 10 pm.   Insulin Pen Needle 31G X 5 MM Misc Inject 15 Units into the skin daily at 10 pm.   loratadine 10 MG tablet Commonly known as:  CLARITIN Take 10 mg by mouth every morning.   losartan 100 MG tablet Commonly known as:  COZAAR Take 1 tablet (100 mg total) by mouth daily.   metFORMIN 1000 MG tablet Commonly known as:  GLUCOPHAGE Take 1 tablet (1,000 mg total) by mouth 2 (two) times daily with a meal.   multivitamin tablet Take 1 tablet by mouth every morning.   OCUVITE ADULT FORMULA PO Take 1 capsule by mouth daily.   omega-3 acid ethyl esters 1 g capsule Commonly known as:  LOVAZA Take 2 g by mouth 2 (two) times daily.   omeprazole 20 MG capsule Commonly known as:  PRILOSEC Take 1 capsule (20 mg total) by mouth daily.   sennosides-docusate sodium 8.6-50 MG tablet Commonly known  as:  SENOKOT-S Take 1 tablet by mouth daily.   simvastatin 40 MG tablet Commonly known as:  ZOCOR Take 40 mg by mouth daily.   vitamin B-12 250 MCG tablet Commonly known as:  CYANOCOBALAMIN Take 250 mcg by mouth daily.          Objective:   Physical Exam BP 132/68 (BP Location: Right Arm, Patient Position: Sitting, Cuff Size: Normal)   Pulse (!) 49   Temp 98.2 F (36.8 C) (Oral)   Resp 14   Ht 6\' 8"  (2.032 m)   Wt 242 lb (109.8 kg)   SpO2 98%   BMI 26.59 kg/m  General:   Well developed, well nourished . NAD.  HEENT:  Normocephalic . Face symmetric, atraumatic Lungs:  CTA B Normal respiratory effort, no intercostal retractions, no accessory muscle use. Heart: Seems regular today.  No pretibial edema bilaterally  Skin: Not pale. Not jaundice Neurologic:  alert & oriented to self, time and space. Cooperative. Psych--  Cognition and judgment appear intact.  Cooperative with normal attention span and concentration.  Behavior appropriate. No anxious or depressed appearing.      Assessment & Plan:    Assessment -- seen regularly at [name], most chronic med issues f/u there DM with neuropathy  -- [facility] Anxiety Gait disorder, multifactorial  CV: --CAD --A. 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 Rx not driving, family to provide medications. Intolerant to Aricept and Namenda  H/o eye muscle paralysis congenital  PLAN:  Cognitive impairment: Developed diarrhea with Namenda and stopped it. No other s/e. I explained the patient and his wife the benefits of taking meds, he politely declined. He is adhering to no-driving. He is somewhat restless but is not wondering out of their property. On today's exam, he is alert and oriented 3. Plan: Intolerant to medications, Rx  observation, counsled. Has an appointment to see our neurologist in few weeks. Other issues managed at [name] RTC 6-8 months

## 2016-04-11 NOTE — Patient Instructions (Signed)
   GO TO THE FRONT DESK Schedule your next appointment for a  Check up in 6 months  

## 2016-04-11 NOTE — Progress Notes (Signed)
Pre visit review using our clinic review tool, if applicable. No additional management support is needed unless otherwise documented below in the visit note. 

## 2016-04-12 ENCOUNTER — Telehealth: Payer: Self-pay | Admitting: Internal Medicine

## 2016-04-12 NOTE — Telephone Encounter (Signed)
Spoke w/ Pt's wife, Orbie Hurst, informed her of recommendations. Recommended to discuss with Pt and neurologist. [name] verbalized understanding.

## 2016-04-12 NOTE — Telephone Encounter (Signed)
Please advise 

## 2016-04-12 NOTE — Assessment & Plan Note (Signed)
Cognitive impairment: Developed diarrhea with Namenda and stopped it. No other s/e. I explained the patient and his wife the benefits of taking meds, he politely declined. He is adhering to no-driving. He is somewhat restless but is not wondering out of their property. On today's exam, he is alert and oriented 3. Plan: Intolerant to medications, Rx observation, counsled. Has an appointment to see our neurologist in few weeks. Other issues managed at [name] RTC 6-8 months

## 2016-04-12 NOTE — Telephone Encounter (Signed)
Advise patient's wife:  Despite memory problem, I don't believe I can  prescribe medications to him without his approval and knowledge. If so desired, recommend to discuss with neurology at the next opportunity. Marland Kitchen

## 2016-04-12 NOTE — Telephone Encounter (Signed)
Patient's wife called stating that she would like to get a rx for patient's dementia called in and she will put it in with his other medications. Patient does not want to take the medication but she wants him to. She states that she does not want her husband to know about this. If calling back she would like someone to ask for her. Please advise   Phone: [phone]

## 2016-04-20 NOTE — Telephone Encounter (Addendum)
Patient would like to discuss dementia medication, please advise best # [phone]

## 2016-04-20 NOTE — Telephone Encounter (Signed)
Spoke w/ Pt, informed he will need to discuss dementia medications w/ Dr. Carles Collet, his neurologist. Pt verbalized understanding.

## 2016-05-01 ENCOUNTER — Ambulatory Visit: Payer: Medicare Other | Admitting: Internal Medicine

## 2016-05-11 ENCOUNTER — Encounter: Payer: Self-pay | Admitting: Neurology

## 2016-05-25 ENCOUNTER — Ambulatory Visit (INDEPENDENT_AMBULATORY_CARE_PROVIDER_SITE_OTHER): Payer: Medicare Other | Admitting: Internal Medicine

## 2016-05-25 ENCOUNTER — Encounter: Payer: Self-pay | Admitting: Internal Medicine

## 2016-05-25 ENCOUNTER — Ambulatory Visit (HOSPITAL_BASED_OUTPATIENT_CLINIC_OR_DEPARTMENT_OTHER)
Admission: RE | Admit: 2016-05-25 | Discharge: 2016-05-25 | Disposition: A | Discharge status: Home / Self Care | Payer: Medicare Other | Source: Ambulatory Visit | Attending: Internal Medicine | Admitting: Internal Medicine

## 2016-05-25 VITALS — BP 122/72 | HR 113 | Temp 98.3°F | Resp 14 | Ht >= 80 in | Wt 233.4 lb

## 2016-05-25 DIAGNOSIS — E118 Type 2 diabetes mellitus with unspecified complications: Secondary | ICD-10-CM

## 2016-05-25 DIAGNOSIS — R5383 Other fatigue: Secondary | ICD-10-CM

## 2016-05-25 DIAGNOSIS — R634 Abnormal weight loss: Secondary | ICD-10-CM | POA: Diagnosis present

## 2016-05-25 DIAGNOSIS — R413 Other amnesia: Secondary | ICD-10-CM

## 2016-05-25 LAB — VITAMIN B12: Vitamin B-12: 326 pg/mL (ref 200–1100)

## 2016-05-25 LAB — COMPREHENSIVE METABOLIC PANEL
ALBUMIN: 3.9 g/dL (ref 3.6–5.1)
ALT: 17 U/L (ref 9–46)
AST: 12 U/L (ref 10–35)
Alkaline Phosphatase: 66 U/L (ref 40–115)
BUN: 22 mg/dL (ref 7–25)
CHLORIDE: 104 mmol/L (ref 98–110)
CO2: 25 mmol/L (ref 20–31)
Calcium: 9.3 mg/dL (ref 8.6–10.3)
Creat: 1.19 mg/dL — ABNORMAL HIGH (ref 0.70–1.18)
Glucose, Bld: 152 mg/dL — ABNORMAL HIGH (ref 65–99)
POTASSIUM: 4.6 mmol/L (ref 3.5–5.3)
Sodium: 139 mmol/L (ref 135–146)
TOTAL PROTEIN: 6.2 g/dL (ref 6.1–8.1)
Total Bilirubin: 0.4 mg/dL (ref 0.2–1.2)

## 2016-05-25 LAB — CBC WITH DIFFERENTIAL/PLATELET
BASOS PCT: 0 %
Basophils Absolute: 0 cells/uL (ref 0–200)
Eosinophils Absolute: 83 cells/uL (ref 15–500)
Eosinophils Relative: 1 %
HEMATOCRIT: 44 % (ref 38.5–50.0)
HEMOGLOBIN: 15 g/dL (ref 13.2–17.1)
LYMPHS ABS: 1826 {cells}/uL (ref 850–3900)
Lymphocytes Relative: 22 %
MCH: 31.8 pg (ref 27.0–33.0)
MCHC: 34.1 g/dL (ref 32.0–36.0)
MCV: 93.2 fL (ref 80.0–100.0)
MPV: 9.3 fL (ref 7.5–12.5)
Monocytes Absolute: 664 cells/uL (ref 200–950)
Monocytes Relative: 8 %
Neutro Abs: 5727 cells/uL (ref 1500–7800)
Neutrophils Relative %: 69 %
Platelets: 207 10*3/uL (ref 140–400)
RBC: 4.72 MIL/uL (ref 4.20–5.80)
RDW: 14 % (ref 11.0–15.0)
WBC: 8.3 10*3/uL (ref 3.8–10.8)

## 2016-05-25 LAB — TSH: TSH: 1.82 m[IU]/L (ref 0.40–4.50)

## 2016-05-25 LAB — FOLATE: FOLATE: 18.5 ng/mL (ref >5.4)

## 2016-05-25 MED ORDER — BUPROPION HCL 75 MG PO TABS: 75.0000 mg | ORAL_TABLET | Freq: Two times a day (BID) | ORAL | 1 refills | Status: AC

## 2016-05-25 MED ORDER — BUPROPION HCL 75 MG PO TABS: 75.0000 mg | ORAL_TABLET | Freq: Two times a day (BID) | ORAL | 0 refills | Status: DC

## 2016-05-25 NOTE — Progress Notes (Signed)
Pre visit review using our clinic review tool, if applicable. No additional management support is needed unless otherwise documented below in the visit note. 

## 2016-05-25 NOTE — Progress Notes (Signed)
Subjective:    Patient ID: Ivan Anderson, male    DOB: Mar 12, 1941, 76 y.o.   MRN: [id number]  DOS:  05/25/2016 Type of visit - description : Acute visit Interval history: Patient is here because for the last few weeks has noted a marked weight loss. Reports that he is not eating as much as before, denies difficulty swallowing or postprandial pain, he simply just has loss the taste for food. He is also very fatigued, has to rest after he do regular activities but still  tries to exercise, he did 40 minutes at the gym few days ago  and was exhausted and couldn't "do anything else" the rest of the day. When asked, admits to some depression mostly because he realizes he is not as strong  physically and mentally as he once was.    Wt Readings from Last 3 Encounters:  05/25/16 233 lb 6 oz (105.9 kg)  04/11/16 242 lb (109.8 kg)  01/16/16 237 lb (107.5 kg)     Review of Systems  Denies fever chills No night sweats No chest pain, difficulty breathing or DOE No nausea vomiting He decided to try Aricept, 2 weeks ago. Had diarrhea a couple of times but that is resolved. No cough The leg is slightly weaker in the last few months.  Past Medical History:  Diagnosis Date  . Anxiety   . Atrial fibrillation, new onset ([place]) 03/27/2015  . CAD (coronary artery disease)   . Colon polyp    Cscope at [facility] aprox 2006, repeated 2010 (-), next 5 years  . Diabetes mellitus with neuropathy ([place])   . Diaphragm paralysis    R, per pt   . DJD (degenerative joint disease)   . Erectile dysfunction   . Eye muscle paralysis    congenital, lateral rectus, left  . Heart palpitations   . Hyperlipidemia   . Microscopic hematuria 2017   seeing urology  . Mild cognitive disorder    Unspecified mild NCD  . Skin cancer    sees derm    Past Surgical History:  Procedure Laterality Date  . ANKLE FUSION Right   . APPENDECTOMY    . CARPAL TUNNEL RELEASE     B, 2000  . CYSTOSCOPY  2017  . HIP SURGERY  Right 1964   pins  . HIP SURGERY  2009   RIGHT--s/p hip replacement, s/p revision August 2009 due to dislocation x 3   . HIP SURGERY  04-2012, 2017   R hip revision  . KNEE SURGERY Left 2005  . MEDIAL PARTIAL KNEE REPLACEMENT Right 08-2012  . TOTAL HIP ARTHROPLASTY  08-09-15   [facility]    Social History   Social History  . Marital status: Married    Spouse name: N/A  . Number of children: N/A  . 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 Smoking status: Former Smoker    Quit date: 01/02/1981  . Smokeless tobacco: Never Used     Comment: quit 1980s  . Alcohol use No  . Drug use: No  . Sexual activity: Not on file   Other Topics Concern  . Not on file   Social History Narrative  . No narrative on file      Allergies as of 05/25/2016      Reactions   Pseudoephedrine Other (See Comments)   Dx: A. Fib.       Medication List  Accurate as of 05/25/16 11:59 PM. Always use your most recent med list.          acetaminophen 500 MG tablet Commonly known as:  TYLENOL Take 1,000 mg by mouth every 6 (six) hours as needed.   buPROPion 75 MG tablet Commonly known as:  WELLBUTRIN Take 1 tablet (75 mg total) by mouth 2 (two) times daily.   Cinnamon 500 MG capsule Take 500 mg by mouth 2 (two) times daily.   diltiazem 120 MG 24 hr capsule Commonly known as:  DILACOR XR Take 120 mg by mouth daily.   donepezil 5 MG tablet Commonly known as:  ARICEPT Take 1 tablet (5 mg total) by mouth at bedtime.   ELIQUIS 5 MG Tabs tablet Generic drug:  apixaban Take 5 mg by mouth 2 (two) times daily.   FLUoxetine 20 MG tablet Commonly known as:  PROZAC Take 2 tablets (40 mg total) by mouth daily.   furosemide 20 MG tablet Commonly known as:  LASIX Take 1 tablet (20 mg total) by mouth daily.   Insulin Glargine 100 UNIT/ML Solostar Pen Commonly known as:  LANTUS SOLOSTAR Inject 15 Units into the skin daily at 10  pm.   Insulin Pen Needle 31G X 5 MM Misc Inject 15 Units into the skin daily at 10 pm.   loratadine 10 MG tablet Commonly known as:  CLARITIN Take 10 mg by mouth every morning.   losartan 100 MG tablet Commonly known as:  COZAAR Take 1 tablet (100 mg total) by mouth daily.   metFORMIN 1000 MG tablet Commonly known as:  GLUCOPHAGE Take 1 tablet (1,000 mg total) by mouth 2 (two) times daily with a meal.   multivitamin tablet Take 1 tablet by mouth every morning.   OCUVITE ADULT FORMULA PO Take 1 capsule by mouth daily.   omega-3 acid ethyl esters 1 g capsule Commonly known as:  LOVAZA Take 2 g by mouth 2 (two) times daily.   omeprazole 20 MG capsule Commonly known as:  PRILOSEC Take 1 capsule (20 mg total) by mouth daily.   sennosides-docusate sodium 8.6-50 MG tablet Commonly known as:  SENOKOT-S Take 1 tablet by mouth daily.   simvastatin 40 MG tablet Commonly known as:  ZOCOR Take 40 mg by mouth daily.   vitamin B-12 250 MCG tablet Commonly known as:  CYANOCOBALAMIN Take 250 mcg by mouth daily.          Objective:   Physical Exam BP 122/72 (BP Location: Left Arm, Patient Position: Sitting, Cuff Size: Normal)   Pulse (!) 113   Temp 98.3 F (36.8 C) (Oral)   Resp 14   Ht 6\' 8"  (2.032 m)   Wt 233 lb 6 oz (105.9 kg)   SpO2 97%   BMI 25.64 kg/m  General:   Well developed, well nourished . NAD.  HEENT:  Normocephalic . Face symmetric, atraumatic. Neck no thyromegaly Lungs:  CTA B Normal respiratory effort, no intercostal retractions, no accessory muscle use. Heart: RRR,  no murmur.  no pretibial edema bilaterally  Abdomen:  Not distended, soft, non-tender. No rebound or rigidity.  Skin: Not pale. Not jaundice Neurologic:  alert & oriented X3.  Speech normal, gait appropriate for age and unassisted Psych--  Cognition and judgment appear intact.  Cooperative with normal attention span and concentration.  Behavior appropriate. No anxious or  depressed appearing.    Assessment & Plan:     Assessment -- seen regularly at [name], most chronic med issues f/u  there DM with neuropathy  -- [facility] Anxiety Gait disorder, multifactorial  CV: --CAD --A. 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 Rx not driving, family to provide medications. Intolerant to Aricept and Namenda  H/o eye muscle paralysis congenital  PLAN:  Weight loss, fatigue: Trend started  a few weeks ago, ROS essentially (-) except for depression. Will do a workup to rule out underlying conditions. Chest x-ray, CMP, CBC, 123456, TSH, 123456, folic acid, UA. Reassess on RTC. Depression: He has been well on Prozac for a while, he realizes that physically - mentally he is not as strong as before and that is triggering his depression. Patient is counseled to the best of my ability. We agreed to add Wellbutrin. Cognition  impairment: Decided to start Aricept ~ 2 weeks ago. Weight loss and fatigue preceded  onset of Aricept but will d/c aricept if wt loss continue as it may exacerbate the issue   " Dragging right foot": Chronic issue, no acute onset, multiple orthopedic problems on that side, will reassess on RTC RTC 4 weeks

## 2016-05-25 NOTE — Patient Instructions (Signed)
GO TO THE LAB : Get the blood work    GO TO THE FRONT DESK Schedule your next appointment for a  Check up in 4 weeks     STOP BY THE FIRST FLOOR:  get the XR    wellbutrin  75 mg: Take only  1 tablet at night  for the first 10 days, then one tablet twice a day

## 2016-05-26 LAB — HEMOGLOBIN A1C
Hgb A1c MFr Bld: 6 % — ABNORMAL HIGH (ref <5.7)
MEAN PLASMA GLUCOSE: 126 mg/dL

## 2016-05-26 NOTE — Assessment & Plan Note (Signed)
Weight loss, fatigue: Trend started  a few weeks ago, ROS essentially (-) except for depression. Will do a workup to rule out underlying conditions. Chest x-ray, CMP, CBC, 123456, TSH, 123456, folic acid, UA. Reassess on RTC. Depression: He has been well on Prozac for a while, he realizes that physically - mentally he is not as strong as before and that is triggering his depression. Patient is counseled to the best of my ability. We agreed to add Wellbutrin. Cognition  impairment: Decided to start Aricept ~ 2 weeks ago. Weight loss and fatigue preceded  onset of Aricept but will d/c aricept if wt loss continue as it may exacerbate the issue   " Dragging right foot": Chronic issue, no acute onset, multiple orthopedic problems on that side, will reassess on RTC RTC 4 weeks

## 2016-05-27 LAB — URINE CULTURE: ORGANISM ID, BACTERIA: NO GROWTH

## 2016-05-29 LAB — URINALYSIS, ROUTINE W REFLEX MICROSCOPIC

## 2016-06-05 ENCOUNTER — Encounter (INDEPENDENT_AMBULATORY_CARE_PROVIDER_SITE_OTHER): Payer: Self-pay

## 2016-06-15 ENCOUNTER — Emergency Department (HOSPITAL_COMMUNITY): Payer: Medicare Other

## 2016-06-15 ENCOUNTER — Observation Stay (HOSPITAL_COMMUNITY)
Admission: EM | Admit: 2016-06-15 | Discharge: 2016-06-16 | Disposition: A | Discharge status: Home / Self Care | Payer: Medicare Other | Attending: Family Medicine | Admitting: Family Medicine

## 2016-06-15 ENCOUNTER — Encounter (HOSPITAL_COMMUNITY): Payer: Self-pay | Admitting: Emergency Medicine

## 2016-06-15 DIAGNOSIS — Z7901 Long term (current) use of anticoagulants: Secondary | ICD-10-CM | POA: Diagnosis not present

## 2016-06-15 DIAGNOSIS — Z66 Do not resuscitate: Secondary | ICD-10-CM | POA: Diagnosis not present

## 2016-06-15 DIAGNOSIS — E119 Type 2 diabetes mellitus without complications: Secondary | ICD-10-CM

## 2016-06-15 DIAGNOSIS — F419 Anxiety disorder, unspecified: Secondary | ICD-10-CM | POA: Diagnosis not present

## 2016-06-15 DIAGNOSIS — E1159 Type 2 diabetes mellitus with other circulatory complications: Secondary | ICD-10-CM

## 2016-06-15 DIAGNOSIS — R42 Dizziness and giddiness: Secondary | ICD-10-CM | POA: Diagnosis not present

## 2016-06-15 DIAGNOSIS — Z794 Long term (current) use of insulin: Secondary | ICD-10-CM | POA: Insufficient documentation

## 2016-06-15 DIAGNOSIS — Z87891 Personal history of nicotine dependence: Secondary | ICD-10-CM | POA: Insufficient documentation

## 2016-06-15 DIAGNOSIS — I10 Essential (primary) hypertension: Secondary | ICD-10-CM | POA: Diagnosis not present

## 2016-06-15 DIAGNOSIS — F0390 Unspecified dementia without behavioral disturbance: Secondary | ICD-10-CM | POA: Insufficient documentation

## 2016-06-15 DIAGNOSIS — Z96651 Presence of right artificial knee joint: Secondary | ICD-10-CM | POA: Insufficient documentation

## 2016-06-15 DIAGNOSIS — E785 Hyperlipidemia, unspecified: Secondary | ICD-10-CM | POA: Insufficient documentation

## 2016-06-15 DIAGNOSIS — G309 Alzheimer's disease, unspecified: Secondary | ICD-10-CM | POA: Diagnosis not present

## 2016-06-15 DIAGNOSIS — I440 Atrioventricular block, first degree: Secondary | ICD-10-CM | POA: Diagnosis not present

## 2016-06-15 DIAGNOSIS — I2510 Atherosclerotic heart disease of native coronary artery without angina pectoris: Secondary | ICD-10-CM | POA: Insufficient documentation

## 2016-06-15 DIAGNOSIS — R531 Weakness: Secondary | ICD-10-CM | POA: Diagnosis not present

## 2016-06-15 DIAGNOSIS — F0280 Dementia in other diseases classified elsewhere without behavioral disturbance: Secondary | ICD-10-CM | POA: Diagnosis not present

## 2016-06-15 DIAGNOSIS — Z79899 Other long term (current) drug therapy: Secondary | ICD-10-CM | POA: Insufficient documentation

## 2016-06-15 DIAGNOSIS — I480 Paroxysmal atrial fibrillation: Secondary | ICD-10-CM | POA: Diagnosis not present

## 2016-06-15 DIAGNOSIS — Z96641 Presence of right artificial hip joint: Secondary | ICD-10-CM | POA: Insufficient documentation

## 2016-06-15 DIAGNOSIS — R001 Bradycardia, unspecified: Secondary | ICD-10-CM | POA: Diagnosis not present

## 2016-06-15 DIAGNOSIS — E1140 Type 2 diabetes mellitus with diabetic neuropathy, unspecified: Secondary | ICD-10-CM | POA: Insufficient documentation

## 2016-06-15 LAB — CBC
HCT: 38.9 % — ABNORMAL LOW (ref 39.0–52.0)
Hemoglobin: 13.6 g/dL (ref 13.0–17.0)
MCH: 31.6 pg (ref 26.0–34.0)
MCHC: 35 g/dL (ref 30.0–36.0)
MCV: 90.3 fL (ref 78.0–100.0)
Platelets: 167 10*3/uL (ref 150–400)
RBC: 4.31 MIL/uL (ref 4.22–5.81)
RDW: 13.1 % (ref 11.5–15.5)
WBC: 6.3 10*3/uL (ref 4.0–10.5)

## 2016-06-15 LAB — URINALYSIS, ROUTINE W REFLEX MICROSCOPIC
Bilirubin Urine: NEGATIVE
Glucose, UA: NEGATIVE mg/dL
Hgb urine dipstick: NEGATIVE
Ketones, ur: NEGATIVE mg/dL
Leukocytes, UA: NEGATIVE
Nitrite: NEGATIVE
Protein, ur: NEGATIVE mg/dL
Specific Gravity, Urine: 1.023 (ref 1.005–1.030)
pH: 5 (ref 5.0–8.0)

## 2016-06-15 LAB — BASIC METABOLIC PANEL
Anion gap: 8 (ref 5–15)
BUN: 14 mg/dL (ref 6–20)
CO2: 25 mmol/L (ref 22–32)
Calcium: 8.9 mg/dL (ref 8.9–10.3)
Chloride: 105 mmol/L (ref 101–111)
Creatinine, Ser: 0.7 mg/dL (ref 0.61–1.24)
GFR calc Af Amer: >60 mL/min (ref >60)
GFR calc non Af Amer: >60 mL/min (ref >60)
Glucose, Bld: 160 mg/dL — ABNORMAL HIGH (ref 65–99)
Potassium: 3.9 mmol/L (ref 3.5–5.1)
Sodium: 138 mmol/L (ref 135–145)

## 2016-06-15 LAB — CBG MONITORING, ED: Glucose-Capillary: 157 mg/dL — ABNORMAL HIGH (ref 65–99)

## 2016-06-15 LAB — I-STAT TROPONIN, ED: Troponin i, poc: 0 ng/mL (ref 0.00–0.08)

## 2016-06-15 LAB — TROPONIN I

## 2016-06-15 LAB — GLUCOSE, CAPILLARY: GLUCOSE-CAPILLARY: 160 mg/dL — AB (ref 65–99)

## 2016-06-15 MED ORDER — ACETAMINOPHEN 325 MG PO TABS: 650.0000 mg | ORAL_TABLET | Freq: Four times a day (QID) | ORAL | Status: DC | PRN

## 2016-06-15 MED ORDER — ONDANSETRON HCL 4 MG/2ML IJ SOLN: 4.0000 mg | Freq: Four times a day (QID) | INTRAMUSCULAR | Status: DC | PRN

## 2016-06-15 MED ORDER — DONEPEZIL HCL 5 MG PO TABS
5.0000 mg | ORAL_TABLET | Freq: Every day | ORAL | Status: DC
  Filled 2016-06-15: qty 1

## 2016-06-15 MED ORDER — BISACODYL 10 MG RE SUPP: 10.0000 mg | Freq: Every day | RECTAL | Status: DC | PRN

## 2016-06-15 MED ORDER — LOSARTAN POTASSIUM 50 MG PO TABS
100.0000 mg | ORAL_TABLET | Freq: Every day | ORAL | Status: DC
  Administered 2016-06-16: 100 mg via ORAL
  Filled 2016-06-15: qty 2

## 2016-06-15 MED ORDER — CHLORHEXIDINE GLUCONATE 0.12 % MT SOLN
15.0000 mL | Freq: Two times a day (BID) | OROMUCOSAL | Status: DC
  Administered 2016-06-15: 15 mL via OROMUCOSAL
  Filled 2016-06-15 (×2): qty 15

## 2016-06-15 MED ORDER — APIXABAN 5 MG PO TABS
5.0000 mg | ORAL_TABLET | Freq: Two times a day (BID) | ORAL | Status: DC
  Filled 2016-06-15: qty 1

## 2016-06-15 MED ORDER — ORAL CARE MOUTH RINSE: 15.0000 mL | Freq: Two times a day (BID) | OROMUCOSAL | Status: DC

## 2016-06-15 MED ORDER — ACETAMINOPHEN 650 MG RE SUPP: 650.0000 mg | Freq: Four times a day (QID) | RECTAL | Status: DC | PRN

## 2016-06-15 MED ORDER — SIMVASTATIN 40 MG PO TABS
40.0000 mg | ORAL_TABLET | Freq: Every day | ORAL | Status: DC
  Administered 2016-06-16: 40 mg via ORAL
  Filled 2016-06-15: qty 1

## 2016-06-15 MED ORDER — INSULIN GLARGINE 100 UNIT/ML ~~LOC~~ SOLN
5.0000 [IU] | Freq: Every day | SUBCUTANEOUS | Status: DC
  Administered 2016-06-15: 5 [IU] via SUBCUTANEOUS
  Filled 2016-06-15 (×2): qty 0.05

## 2016-06-15 MED ORDER — POLYETHYLENE GLYCOL 3350 17 G PO PACK: 17.0000 g | PACK | Freq: Every day | ORAL | Status: DC | PRN

## 2016-06-15 MED ORDER — SODIUM CHLORIDE 0.9 % IV SOLN
INTRAVENOUS | Status: AC
  Administered 2016-06-15: 23:00:00 via INTRAVENOUS

## 2016-06-15 MED ORDER — APIXABAN 5 MG PO TABS
5.0000 mg | ORAL_TABLET | Freq: Two times a day (BID) | ORAL | Status: DC
  Administered 2016-06-16: 5 mg via ORAL
  Filled 2016-06-15: qty 1

## 2016-06-15 MED ORDER — INSULIN ASPART 100 UNIT/ML ~~LOC~~ SOLN
0.0000 [IU] | Freq: Three times a day (TID) | SUBCUTANEOUS | Status: DC
  Administered 2016-06-16 (×2): 1 [IU] via SUBCUTANEOUS

## 2016-06-15 MED ORDER — SODIUM CHLORIDE 0.9% FLUSH
3.0000 mL | Freq: Two times a day (BID) | INTRAVENOUS | Status: DC
  Administered 2016-06-15: 3 mL via INTRAVENOUS

## 2016-06-15 MED ORDER — BUPROPION HCL 75 MG PO TABS
75.0000 mg | ORAL_TABLET | Freq: Two times a day (BID) | ORAL | Status: DC
  Administered 2016-06-16: 75 mg via ORAL
  Filled 2016-06-15: qty 1

## 2016-06-15 MED ORDER — SENNA 8.6 MG PO TABS
1.0000 | ORAL_TABLET | Freq: Two times a day (BID) | ORAL | Status: DC
  Administered 2016-06-15 – 2016-06-16 (×2): 8.6 mg via ORAL
  Filled 2016-06-15 (×2): qty 1

## 2016-06-15 MED ORDER — ONDANSETRON HCL 4 MG PO TABS: 4.0000 mg | ORAL_TABLET | Freq: Four times a day (QID) | ORAL | Status: DC | PRN

## 2016-06-15 MED ORDER — FLUOXETINE HCL 20 MG PO CAPS
40.0000 mg | ORAL_CAPSULE | Freq: Every day | ORAL | Status: DC
  Administered 2016-06-16: 40 mg via ORAL
  Filled 2016-06-15: qty 2

## 2016-06-15 MED ORDER — INSULIN ASPART 100 UNIT/ML ~~LOC~~ SOLN: 0.0000 [IU] | Freq: Every day | SUBCUTANEOUS | Status: DC

## 2016-06-15 MED ORDER — DONEPEZIL HCL 5 MG PO TABS: 5.0000 mg | ORAL_TABLET | Freq: Every day | ORAL | Status: DC

## 2016-06-15 MED ORDER — PANTOPRAZOLE SODIUM 40 MG PO TBEC
40.0000 mg | DELAYED_RELEASE_TABLET | Freq: Every day | ORAL | Status: DC
  Administered 2016-06-16: 40 mg via ORAL
  Filled 2016-06-15: qty 1

## 2016-06-15 MED ORDER — BUPROPION HCL 75 MG PO TABS
75.0000 mg | ORAL_TABLET | Freq: Two times a day (BID) | ORAL | Status: DC
  Filled 2016-06-15: qty 1

## 2016-06-15 MED ORDER — LORATADINE 10 MG PO TABS
10.0000 mg | ORAL_TABLET | Freq: Every morning | ORAL | Status: DC
  Administered 2016-06-16: 10 mg via ORAL
  Filled 2016-06-15: qty 1

## 2016-06-15 NOTE — ED Triage Notes (Signed)
Patient c/o generalized weakness and dizziness. Denies abdominal pain, N/V/D, and chest pain. Hx dementia

## 2016-06-15 NOTE — ED Provider Notes (Signed)
[facility] DEPT Provider Note   CSN: [id number] Arrival date & time: 06/15/16  1807     History   Chief Complaint Chief Complaint  Patient presents with  . Weakness    HPI Ivan Anderson is a 76 y.o. male.  Patient PMH of afib taking Eliquis and diltiazem presents to the emergency department with chief complaint of dizziness and fatigue.  He states that his symptoms started 2 days ago and have progressively worsened. He denies any fevers, chills, cough or chest pain. He states that he has felt slightly short of breath. He denies any abdominal pain, nausea, vomiting, or diarrhea, but does report dysuria, and urinary frequency. He denies any numbness, weakness, or tingling of his extremities.   The history is provided by the patient. No language interpreter was used.    Past Medical History:  Diagnosis Date  . Anxiety   . Atrial fibrillation, new onset ([place]) 03/27/2015  . CAD (coronary artery disease)   . Colon polyp    Cscope at [facility] aprox 2006, repeated 2010 (-), next 5 years  . Diabetes mellitus with neuropathy ([place])   . Diaphragm paralysis    R, per pt   . DJD (degenerative joint disease)   . Erectile dysfunction   . Eye muscle paralysis    congenital, lateral rectus, left  . Heart palpitations   . Hyperlipidemia   . Microscopic hematuria 2017   seeing urology  . Mild cognitive disorder    Unspecified mild NCD  . Skin cancer    sees derm    Patient Active Problem List   Diagnosis Date Noted  . PCP NOTES >>>>>> 03/29/2015  . Atrial fibrillation ([place]) 03/27/2015  . 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 Memory impairment 12/23/2013  . TRIGGER FINGER, LEFT MIDDLE 06/08/2010  . HAND PAIN, LEFT 06/08/2010  . ANXIETY 12/19/2006  . 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 Dyslipidemia 10/01/2006  . Diabetes ([place]) 05/04/2006  . ERECTILE DYSFUNCTION 05/04/2006  . HYPERTENSION 05/04/2006  . CORONARY ARTERY DISEASE 05/04/2006  . OSTEOARTHRITIS  05/04/2006  . COLONIC POLYPS, HX OF 05/04/2006    Past Surgical History:  Procedure Laterality Date  . ANKLE FUSION Right   . APPENDECTOMY    . CARPAL TUNNEL RELEASE     B, 2000  . CYSTOSCOPY  2017  . HIP SURGERY Right 1964   pins  . HIP SURGERY  2009   RIGHT--s/p hip replacement, s/p revision August 2009 due to dislocation x 3   . HIP SURGERY  04-2012, 2017   R hip revision  . KNEE SURGERY Left 2005  . MEDIAL PARTIAL KNEE REPLACEMENT Right 08-2012  . TOTAL HIP ARTHROPLASTY  08-09-15   [facility]       Home Medications    Prior to Admission medications   Medication Sig Start Date End Date Taking? Authorizing Provider  acetaminophen (TYLENOL) 500 MG tablet Take 1,000 mg by mouth every 6 (six) hours as needed.    Historical Provider, MD  apixaban (ELIQUIS) 5 MG TABS tablet Take 5 mg by mouth 2 (two) times daily.    Historical Provider, MD  buPROPion (WELLBUTRIN) 75 MG tablet Take 1 tablet (75 mg total) by mouth 2 (two) times daily. 05/25/16   Colon Branch, MD  Cinnamon 500 MG capsule Take 500 mg by mouth 2 (two) times daily.     Historical Provider, MD  diltiazem (DILACOR XR) 120 MG 24 hr capsule Take 120 mg by mouth daily.  Historical Provider, MD  donepezil (ARICEPT) 5 MG tablet Take 1 tablet (5 mg total) by mouth at bedtime. 05/25/16   Colon Branch, MD  FLUoxetine (PROZAC) 20 MG tablet Take 2 tablets (40 mg total) by mouth daily. 07/01/14   Colon Branch, MD  furosemide (LASIX) 20 MG tablet Take 1 tablet (20 mg total) by mouth daily. 12/02/14   Colon Branch, MD  Insulin Glargine (LANTUS SOLOSTAR) 100 UNIT/ML Solostar Pen Inject 15 Units into the skin daily at 10 pm. 01/16/16   Colon Branch, MD  Insulin Pen Needle 31G X 5 MM MISC Inject 15 Units into the skin daily at 10 pm. Patient not taking: Reported on 04/11/2016 01/16/16   Colon Branch, MD  loratadine (CLARITIN) 10 MG tablet Take 10 mg by mouth every morning.  12/02/14   Colon Branch, MD  losartan (COZAAR) 100 MG tablet Take 1 tablet (100  mg total) by mouth daily. 12/02/14   Colon Branch, MD  metFORMIN (GLUCOPHAGE) 1000 MG tablet Take 1 tablet (1,000 mg total) by mouth 2 (two) times daily with a meal. 12/02/14   Colon Branch, MD  Multiple Vitamin (MULTIVITAMIN) tablet Take 1 tablet by mouth every morning.  08/27/12   Historical Provider, MD  Multiple Vitamins-Minerals (OCUVITE ADULT FORMULA PO) Take 1 capsule by mouth daily.    Historical Provider, MD  omega-3 acid ethyl esters (LOVAZA) 1 g capsule Take 2 g by mouth 2 (two) times daily.     Historical Provider, MD  omeprazole (PRILOSEC) 20 MG capsule Take 1 capsule (20 mg total) by mouth daily. 12/02/14   Colon Branch, MD  sennosides-docusate sodium (SENOKOT-S) 8.6-50 MG tablet Take 1 tablet by mouth daily.    Historical Provider, MD  simvastatin (ZOCOR) 40 MG tablet Take 40 mg by mouth daily.     Historical Provider, MD  vitamin B-12 (CYANOCOBALAMIN) 250 MCG tablet Take 250 mcg by mouth daily.    Historical Provider, MD    Family History Family History  Problem Relation Age of Onset  . Dementia Father   . Emphysema Father   . Diabetes Maternal Grandmother   . Heart disease Sister   . Stroke Sister   . Heart attack Neg Hx   . Hypertension Neg Hx     Social History Social History  Substance Use Topics  . Smoking status: Former Smoker    Quit date: 01/02/1981  . Smokeless tobacco: Never Used     Comment: quit 1980s  . Alcohol use No     Allergies   Pseudoephedrine   Review of Systems Review of Systems  Constitutional: Positive for fatigue.  Neurological: Positive for dizziness.  All other systems reviewed and are negative.    Physical Exam Updated Vital Signs BP 167/67 (BP Location: Left Arm)   Pulse (!) 43   Temp 97.8 F (36.6 C) (Oral)   Resp 14   Ht 6\' 6"  (1.981 m)   Wt 104.3 kg   SpO2 97%   BMI 26.58 kg/m   Physical Exam  Constitutional: He is oriented to person, place, and time. He appears well-developed and well-nourished.  HENT:  Head:  Normocephalic and atraumatic.  Eyes: Conjunctivae are normal. Pupils are equal, round, and reactive to light. Right eye exhibits no discharge. Left eye exhibits no discharge. No scleral icterus.  Left eye cannot deviate laterally (baseline or patient)  Neck: Normal range of motion. Neck supple. No JVD present.  Cardiovascular: Regular rhythm and  normal heart sounds.  Exam reveals no gallop and no friction rub.   No murmur heard. Bradycardic  Pulmonary/Chest: Effort normal and breath sounds normal. No respiratory distress. He has no wheezes. He has no rales. He exhibits no tenderness.  Abdominal: Soft. He exhibits no distension and no mass. There is no tenderness. There is no rebound and no guarding.  No focal abdominal tenderness, no RLQ tenderness or pain at McBurney's point, no RUQ tenderness or Murphy's sign, no left-sided abdominal tenderness, no fluid wave, or signs of peritonitis   Musculoskeletal: Normal range of motion. He exhibits no edema or tenderness.  Neurological: He is alert and oriented to person, place, and time.  CN III-12 intact Speech is clear Movements are goal oriented  Skin: Skin is warm and dry.  Psychiatric: He has a normal mood and affect. His behavior is normal. Judgment and thought content normal.  Nursing note and vitals reviewed.    ED Treatments / Results  Labs (all labs ordered are listed, but only abnormal results are displayed) Labs Reviewed  BASIC METABOLIC PANEL - Abnormal; Notable for the following:       Result Value   Glucose, Bld 160 (*)    All other components within normal limits  CBC - Abnormal; Notable for the following:    HCT 38.9 (*)    All other components within normal limits  CBG MONITORING, ED - Abnormal; Notable for the following:    Glucose-Capillary 157 (*)    All other components within normal limits  URINALYSIS, ROUTINE W REFLEX MICROSCOPIC  TROPONIN I  TROPONIN I  TROPONIN I  I-STAT TROPOININ, ED    EKG  EKG  Interpretation  Date/Time:  Friday June 15 2016 18:17:19 EST Ventricular Rate:  43 PR Interval:    QRS Duration: 107 QT Interval:  485 QTC Calculation: 411 R Axis:   25 Text Interpretation:  Sinus bradycardia Confirmed by Wilson Singer  MD, STEPHEN [phone]) on 06/15/2016 8:09:04 PM       Radiology Ct Head Wo Contrast  Result Date: 06/15/2016 CLINICAL DATA:  Dizziness and weakness. EXAM: CT HEAD WITHOUT CONTRAST TECHNIQUE: Contiguous axial images were obtained from the base of the skull through the vertex without intravenous contrast. COMPARISON:  MRI 12/17/2015.  Head CT 12/08/2015. FINDINGS: Brain: There is no evidence for acute hemorrhage, hydrocephalus, mass lesion, or abnormal extra-axial fluid collection. No definite CT evidence for acute infarction. Diffuse loss of parenchymal volume is consistent with atrophy. Patchy low attenuation in the deep hemispheric and periventricular white matter is nonspecific, but likely reflects chronic microvascular ischemic demyelination. Vascular: Atherosclerotic calcification is visualized in the carotid arteries. No dense MCA sign. Major dural sinuses are unremarkable. Skull: No evidence for fracture. No worrisome lytic or sclerotic lesion. Sinuses/Orbits: The visualized paranasal sinuses and mastoid air cells are clear. Visualized portions of the globes and intraorbital fat are unremarkable. Other: None. IMPRESSION: Stable.  No acute intracranial abnormality. Atrophy with chronic small vessel white matter ischemic disease. Electronically Signed   By: Misty Stanley M.D.   On: 06/15/2016 19:35   Dg Chest Port 1 View  Result Date: 06/15/2016 CLINICAL DATA:  Weakness and dizziness. EXAM: PORTABLE CHEST 1 VIEW COMPARISON:  05/25/2016 FINDINGS: Low volume film. The lungs are clear wiithout focal pneumonia, edema, pneumothorax or pleural effusion. Cardiopericardial silhouette is at upper limits of normal for size. Tiny nodular density overlying the posterior right fifth  rib may be related to the cardiac lead. The visualized bony structures of the thorax  are intact. Telemetry leads overlie the chest. IMPRESSION: Tiny nodular density identified right mid lung, not seen on prior study in possibly related to the cardiac lead. Follow-up PA and lateral chest x-ray after resolution of acute symptoms recommended to re-evaluate. Otherwise No acute findings. Electronically Signed   By: Misty Stanley M.D.   On: 06/15/2016 19:04    Procedures Procedures (including critical care time)  Medications Ordered in ED Medications - No data to display   Initial Impression / Assessment and Plan / ED Course  I have reviewed the triage vital signs and the nursing notes.  Pertinent labs & imaging results that were available during my care of the patient were reviewed by me and considered in my medical decision making (see chart for details).     Patient with generalized fatigue and dizziness. He appears neurovascularly intact. He is noted to be bradycardic to the 40s. Hx of afib on Eliquis and dilt.  He does not take a beta blocker.  Additionally, he states that he has had some dysuria and urinary frequency. Will check labs, imaging, and reassess.  Labs and imaging are reassuring.  HR remains in the 40s, and he becomes dizzy while standing.    Patient seen by and discussed with Dr. Wilson Singer, who recommends admission for symptomatic bradycardia.  Final Clinical Impressions(s) / ED Diagnoses   Final diagnoses:  Bradycardia    New Prescriptions New Prescriptions   No medications on file     Montine Circle, PA-C 06/15/16 [place], MD 06/26/16 [phone]

## 2016-06-15 NOTE — ED Notes (Signed)
ED Provider at bedside. 

## 2016-06-15 NOTE — H&P (Signed)
Ivan Anderson JJO:[id number] DOB: 09-Jun-1940 DOA: 06/15/2016     PCP: [name], MD   Outpatient Specialists: Cardiology [facility]  Patient coming from:  home Lives  With family     Chief Complaint: fatigue  HPI: Ivan Anderson is a 76 y.o. male with medical history significant of DM2 CAD, HTN, Afib on anticoagulation, dementia, hyperlipidemia, diabetes neuropathy    Presented with weakness and dizziness no associated chest pain no abdominal pain no nausea vomiting and diarrhea reports occasional dyspnea on exertion. No associated abdominal pain has had occasional dysuria. No neurological complaints denies syncope. Patient states he's been taking all his medications as prescribed but family states he has significant dementia and does not allow him to help him with medications they're unsure if he could've made a mistake in self administration of medications. Patient have had significant weakness today has hardly able to walk. His mental status is also deteriorated has baseline dementia but usually able to recognize his family but today was confused   Regarding pertinent Chronic problems: History of atrial fibrillation on Eliquis and diltiazem Followed by Cardiology    IN ER:  Temp (24hrs), Avg:97.8 F (36.6 C), Min:97.8 F (36.6 C), Max:97.8 F (36.6 C)    Noted to have persistent bradycardia sustained and mid 40s blood pressure 138/63 RR 16 troponin 0.00  Creatinine 0.70 WBC 6.3 Hg 13.6 Ct-head non acute CXR - tiny nodule will need follow-up imaging Following Medications were ordered in ER: Medications - No data to display     Hospitalist was called for admission for symptomatic bradyCardia  Review of Systems:    Pertinent positives include: fatigue, weight loss loss of appetite,  Constitutional:  No weight loss, night sweats, Fevers, chills,  HEENT:  No headaches, Difficulty swallowing,Tooth/dental problems,Sore throat,  No sneezing, itching, ear ache, nasal congestion,  post nasal drip,  Cardio-vascular:  No chest pain, Orthopnea, PND, anasarca, dizziness, palpitations.no Bilateral lower extremity swelling  GI:  No heartburn, indigestion, abdominal pain, nausea, vomiting, diarrhea, change in bowel habits,  melena, blood in stool, hematemesis Resp:  no shortness of breath at rest. No dyspnea on exertion, No excess mucus, no productive cough, No non-productive cough, No coughing up of blood.No change in color of mucus.No wheezing. Skin:  no rash or lesions. No jaundice GU:  no dysuria, change in color of urine, no urgency or frequency. No straining to urinate.  No flank pain.  Musculoskeletal:  No joint pain or no joint swelling. No decreased range of motion. No back pain.  Psych:  No change in mood or affect. No depression or anxiety. No memory loss.  Neuro: no localizing neurological complaints, no tingling, no weakness, no double vision, no gait abnormality, no slurred speech, no confusion  As per HPI otherwise 10 point review of systems negative.   Past Medical History: Past Medical History:  Diagnosis Date  . Anxiety   . Atrial fibrillation, new onset ([place]) 03/27/2015  . CAD (coronary artery disease)   . Colon polyp    Cscope at [facility] aprox 2006, repeated 2010 (-), next 5 years  . Diabetes mellitus with neuropathy ([place])   . Diaphragm paralysis    R, per pt   . DJD (degenerative joint disease)   . Erectile dysfunction   . Eye muscle paralysis    congenital, lateral rectus, left  . Heart palpitations   . Hyperlipidemia   . Microscopic hematuria 2017   seeing urology  . Mild cognitive disorder  Unspecified mild NCD  . Skin cancer    sees derm   Past Surgical History:  Procedure Laterality Date  . ANKLE FUSION Right   . APPENDECTOMY    . CARPAL TUNNEL RELEASE     B, 2000  . CYSTOSCOPY  2017  . HIP SURGERY Right 1964   pins  . HIP SURGERY  2009   RIGHT--s/p hip replacement, s/p revision August 2009 due to dislocation x 3   . HIP  SURGERY  04-2012, 2017   R hip revision  . KNEE SURGERY Left 2005  . MEDIAL PARTIAL KNEE REPLACEMENT Right 08-2012  . TOTAL HIP ARTHROPLASTY  08-09-15   [facility]     Social History:  Ambulatory   independently      reports that he quit smoking about 35 years ago. He has never used smokeless tobacco. He reports that he does not drink alcohol or use drugs.  Allergies:   Allergies  Allergen Reactions  . Pseudoephedrine Other (See Comments)    Dx: A. Fib.        Family History:   Family History  Problem Relation Age of Onset  . Dementia Father   . Emphysema Father   . Diabetes Maternal Grandmother   . Heart disease Sister   . Stroke Sister   . Heart attack Neg Hx   . Hypertension Neg Hx     Medications: Prior to Admission medications   Medication Sig Start Date End Date Taking? Authorizing Provider  acetaminophen (TYLENOL) 500 MG tablet Take 1,000 mg by mouth every 6 (six) hours as needed for moderate pain.    Yes Historical Provider, MD  apixaban (ELIQUIS) 5 MG TABS tablet Take 5 mg by mouth 2 (two) times daily.   Yes Historical Provider, MD  buPROPion (WELLBUTRIN) 75 MG tablet Take 1 tablet (75 mg total) by mouth 2 (two) times daily. 05/25/16  Yes Colon Branch, MD  Cinnamon 500 MG capsule Take 500 mg by mouth 2 (two) times daily.    Yes Historical Provider, MD  diltiazem (DILACOR XR) 120 MG 24 hr capsule Take 120 mg by mouth daily.   Yes Historical Provider, MD  donepezil (ARICEPT) 5 MG tablet Take 1 tablet (5 mg total) by mouth at bedtime. 05/25/16  Yes Colon Branch, MD  FLUoxetine (PROZAC) 20 MG tablet Take 2 tablets (40 mg total) by mouth daily. 07/01/14  Yes Colon Branch, MD  furosemide (LASIX) 20 MG tablet Take 1 tablet (20 mg total) by mouth daily. 12/02/14  Yes Colon Branch, MD  Insulin Glargine (LANTUS SOLOSTAR) 100 UNIT/ML Solostar Pen Inject 15 Units into the skin daily at 10 pm. Patient taking differently: Inject 5 Units into the skin daily at 10 pm.  01/16/16  Yes  Colon Branch, MD  loratadine (CLARITIN) 10 MG tablet Take 10 mg by mouth every morning.  12/02/14  Yes Colon Branch, MD  losartan (COZAAR) 100 MG tablet Take 1 tablet (100 mg total) by mouth daily. 12/02/14  Yes Colon Branch, MD  metFORMIN (GLUCOPHAGE) 1000 MG tablet Take 1 tablet (1,000 mg total) by mouth 2 (two) times daily with a meal. 12/02/14  Yes Colon Branch, MD  Multiple Vitamin (MULTIVITAMIN) tablet Take 1 tablet by mouth every morning.  08/27/12  Yes Historical Provider, MD  naproxen sodium (ANAPROX) 220 MG tablet Take 220 mg by mouth daily as needed (pain).   Yes Historical Provider, MD  omega-3 acid ethyl esters (LOVAZA) 1 g  capsule Take 2 g by mouth 2 (two) times daily.    Yes Historical Provider, MD  omeprazole (PRILOSEC) 20 MG capsule Take 1 capsule (20 mg total) by mouth daily. 12/02/14  Yes Colon Branch, MD  polyvinyl alcohol (LIQUIFILM TEARS) 1.4 % ophthalmic solution Place 1 drop into both eyes 3 (three) times daily as needed for dry eyes.   Yes Historical Provider, MD  simvastatin (ZOCOR) 40 MG tablet Take 40 mg by mouth daily.    Yes Historical Provider, MD  vitamin B-12 (CYANOCOBALAMIN) 250 MCG tablet Take 250 mcg by mouth daily.   Yes Historical Provider, MD  Insulin Pen Needle 31G X 5 MM MISC Inject 15 Units into the skin daily at 10 pm. Patient not taking: Reported on 04/11/2016 01/16/16   Colon Branch, MD    Physical Exam: Patient Vitals for the past 24 hrs:  BP Temp Temp src Pulse Resp SpO2 Height Weight  06/15/16 2030 138/63 - - (!) 44 22 100 % - -  06/15/16 2015 153/66 - - (!) 44 13 100 % - -  06/15/16 2000 144/68 - - (!) 44 16 100 % - -  06/15/16 1945 146/68 - - (!) 42 20 99 % - -  06/15/16 1930 160/67 - - (!) 47 17 99 % - -  06/15/16 1915 153/64 - - (!) 45 15 99 % - -  06/15/16 1900 150/69 - - (!) 42 18 99 % - -  06/15/16 1845 141/65 - - (!) 43 20 99 % - -  06/15/16 1836 167/67 - - (!) 43 14 97 % - -  06/15/16 1815 147/57 - - - - - 6\' 6"  (1.981 m) 104.3 kg (230 lb)  06/15/16  1813 - 97.8 F (36.6 C) Oral (!) 42 16 98 % - -    1. General:  in No Acute distress 2. Psychological: Alert and   Oriented 3. Head/ENT:     Dry Mucous Membranes                          Head Non traumatic, neck supple                          Poor Dentition 4. SKIN:    decreased Skin turgor,  Skin clean Dry and intact no rash 5. Heart: Regular rate and rhythm no  Murmur, Rub or gallop 6. Lungs: no wheezes or crackles   7. Abdomen: Soft, non-tender, Non distended 8. Lower extremities: no clubbing, cyanosis, or edema 9. Neurologically Grossly intact, moving all 4 extremities equally   10. MSK: Normal range of motion   body mass index is 26.58 kg/m.  Labs on Admission:   Labs on Admission: I have personally reviewed following labs and imaging studies  CBC:  Recent Labs Lab 06/15/16 1826  WBC 6.3  HGB 13.6  HCT 38.9*  MCV 90.3  PLT 182   Basic Metabolic Panel:  Recent Labs Lab 06/15/16 1826  NA 138  K 3.9  CL 105  CO2 25  GLUCOSE 160*  BUN 14  CREATININE 0.70  CALCIUM 8.9   GFR: Estimated Creatinine Clearance: 100 mL/min (by C-G formula based on SCr of 0.7 mg/dL). Liver Function Tests: No results for input(s): AST, ALT, ALKPHOS, BILITOT, PROT, ALBUMIN in the last 168 hours. No results for input(s): LIPASE, AMYLASE in the last 168 hours. No results for input(s): AMMONIA in the last  168 hours. Coagulation Profile: No results for input(s): INR, PROTIME in the last 168 hours. Cardiac Enzymes: No results for input(s): CKTOTAL, CKMB, CKMBINDEX, TROPONINI in the last 168 hours. BNP (last 3 results) No results for input(s): PROBNP in the last 8760 hours. HbA1C: No results for input(s): HGBA1C in the last 72 hours. CBG:  Recent Labs Lab 06/15/16 1840  GLUCAP 157*   Lipid Profile: No results for input(s): CHOL, HDL, LDLCALC, TRIG, CHOLHDL, LDLDIRECT in the last 72 hours. Thyroid Function Tests: No results for input(s): TSH, T4TOTAL, FREET4, T3FREE,  THYROIDAB in the last 72 hours. Anemia Panel: No results for input(s): VITAMINB12, FOLATE, FERRITIN, TIBC, IRON, RETICCTPCT in the last 72 hours. Urine analysis:    Component Value Date/Time   COLORURINE YELLOW 06/15/2016 1850   APPEARANCEUR CLEAR 06/15/2016 1850   LABSPEC 1.023 06/15/2016 1850   PHURINE 5.0 06/15/2016 1850   GLUCOSEU NEGATIVE 06/15/2016 1850   GLUCOSEU NEGATIVE 12/27/2015 1240   HGBUR NEGATIVE 06/15/2016 1850   BILIRUBINUR NEGATIVE 06/15/2016 1850   KETONESUR NEGATIVE 06/15/2016 1850   PROTEINUR NEGATIVE 06/15/2016 1850   UROBILINOGEN 1.0 12/27/2015 1240   NITRITE NEGATIVE 06/15/2016 1850   LEUKOCYTESUR NEGATIVE 06/15/2016 1850   Sepsis Labs: @LABRCNTIP (procalcitonin:4,lacticidven:4) )No results found for this or any previous visit (from the past 240 hour(s)).    UA   no evidence of UTI   Lab Results  Component Value Date   HGBA1C 6.0 (H) 05/25/2016    Estimated Creatinine Clearance: 100 mL/min (by C-G formula based on SCr of 0.7 mg/dL).  BNP (last 3 results) No results for input(s): PROBNP in the last 8760 hours.   ECG REPORT  Independently reviewed Rate: 43  Rhythm: sinus bradycardia ST&T Change: No acute ischemic changes   QTC 411  Filed Weights   06/15/16 1815  Weight: 104.3 kg (230 lb)     Cultures: No results found for: SDES, SPECREQUEST, CULT, REPTSTATUS   Radiological Exams on Admission: Ct Head Wo Contrast  Result Date: 06/15/2016 CLINICAL DATA:  Dizziness and weakness. EXAM: CT HEAD WITHOUT CONTRAST TECHNIQUE: Contiguous axial images were obtained from the base of the skull through the vertex without intravenous contrast. COMPARISON:  MRI 12/17/2015.  Head CT 12/08/2015. FINDINGS: Brain: There is no evidence for acute hemorrhage, hydrocephalus, mass lesion, or abnormal extra-axial fluid collection. No definite CT evidence for acute infarction. Diffuse loss of parenchymal volume is consistent with atrophy. Patchy low attenuation in the  deep hemispheric and periventricular white matter is nonspecific, but likely reflects chronic microvascular ischemic demyelination. Vascular: Atherosclerotic calcification is visualized in the carotid arteries. No dense MCA sign. Major dural sinuses are unremarkable. Skull: No evidence for fracture. No worrisome lytic or sclerotic lesion. Sinuses/Orbits: The visualized paranasal sinuses and mastoid air cells are clear. Visualized portions of the globes and intraorbital fat are unremarkable. Other: None. IMPRESSION: Stable.  No acute intracranial abnormality. Atrophy with chronic small vessel white matter ischemic disease. Electronically Signed   By: Misty Stanley M.D.   On: 06/15/2016 19:35   Dg Chest Port 1 View  Result Date: 06/15/2016 CLINICAL DATA:  Weakness and dizziness. EXAM: PORTABLE CHEST 1 VIEW COMPARISON:  05/25/2016 FINDINGS: Low volume film. The lungs are clear wiithout focal pneumonia, edema, pneumothorax or pleural effusion. Cardiopericardial silhouette is at upper limits of normal for size. Tiny nodular density overlying the posterior right fifth rib may be related to the cardiac lead. The visualized bony structures of the thorax are intact. Telemetry leads overlie the chest. IMPRESSION: Tiny nodular  density identified right mid lung, not seen on prior study in possibly related to the cardiac lead. Follow-up PA and lateral chest x-ray after resolution of acute symptoms recommended to re-evaluate. Otherwise No acute findings. Electronically Signed   By: Misty Stanley M.D.   On: 06/15/2016 19:04    Chart has been reviewed    Assessment/Plan  76 y.o. male with medical history significant of DM2 CAD, HTN, Afib on anticoagulation, dementia, hyperlipidemia, diabetes neuropathy admitted for symptomatic sinus bradycardia vs dehdyration  Present on Admission: . Atrial fibrillation ([place])-  .Chronic Atrial fibrillation ([facility])        - CHA2DS2 vas score 4: continue current anticoagulation with    Eliquis           -  Rate control:  On hold due to bradycardia        . Essential hypertension stable continue ARB but hold diltiazem . Dementia - chronic continue home medications and expect some degree of sundowning . Bradycardia - hold diltiazem check echo gram and TSH monitor on telemetry, notify Cardiology  [place] about dehydration will rehydrate tonight hold Lasix Diabetes mellitus we'll order sliding scale and continue home dose Lantus Other plan as per orders.  DVT prophylaxis:  Eliquis     Code Status:    DNR/DNI  as per patient    Family Communication:   Family   at  Bedside  plan of care was discussed with   Daughter, Wife,    Disposition Plan:   To home once workup is complete and patient is stable                        Would benefit from PT/OT eval prior to DC  ordered                                                Consults called: email cardiology   Admission status:   obs   Level of care    tele          I have spent a total of 56 min on this admission    Sakiyah Shur 06/15/2016, 9:43 PM    [facility]  Pager [phone]   after 2 AM please page floor coverage PA If 7AM-7PM, please contact the day team taking care of the patient  [email or web address]  Password [email or web address]

## 2016-06-16 ENCOUNTER — Observation Stay (HOSPITAL_BASED_OUTPATIENT_CLINIC_OR_DEPARTMENT_OTHER): Payer: Medicare Other

## 2016-06-16 DIAGNOSIS — R001 Bradycardia, unspecified: Secondary | ICD-10-CM | POA: Diagnosis not present

## 2016-06-16 DIAGNOSIS — I10 Essential (primary) hypertension: Secondary | ICD-10-CM

## 2016-06-16 DIAGNOSIS — R9431 Abnormal electrocardiogram [ECG] [EKG]: Secondary | ICD-10-CM | POA: Diagnosis not present

## 2016-06-16 DIAGNOSIS — F0280 Dementia in other diseases classified elsewhere without behavioral disturbance: Secondary | ICD-10-CM

## 2016-06-16 DIAGNOSIS — I480 Paroxysmal atrial fibrillation: Secondary | ICD-10-CM | POA: Diagnosis not present

## 2016-06-16 DIAGNOSIS — G309 Alzheimer's disease, unspecified: Secondary | ICD-10-CM

## 2016-06-16 DIAGNOSIS — F0390 Unspecified dementia without behavioral disturbance: Secondary | ICD-10-CM | POA: Diagnosis not present

## 2016-06-16 DIAGNOSIS — I2510 Atherosclerotic heart disease of native coronary artery without angina pectoris: Secondary | ICD-10-CM | POA: Diagnosis not present

## 2016-06-16 LAB — TSH: TSH: 1.55 u[IU]/mL (ref 0.350–4.500)

## 2016-06-16 LAB — GLUCOSE, CAPILLARY
GLUCOSE-CAPILLARY: 122 mg/dL — AB (ref 65–99)
Glucose-Capillary: 146 mg/dL — ABNORMAL HIGH (ref 65–99)

## 2016-06-16 LAB — COMPREHENSIVE METABOLIC PANEL
ALT: 18 U/L (ref 17–63)
ANION GAP: 7 (ref 5–15)
AST: 13 U/L — ABNORMAL LOW (ref 15–41)
Albumin: 3.6 g/dL (ref 3.5–5.0)
Alkaline Phosphatase: 65 U/L (ref 38–126)
BUN: 10 mg/dL (ref 6–20)
CALCIUM: 8.9 mg/dL (ref 8.9–10.3)
CHLORIDE: 106 mmol/L (ref 101–111)
CO2: 26 mmol/L (ref 22–32)
CREATININE: 0.66 mg/dL (ref 0.61–1.24)
Glucose, Bld: 112 mg/dL — ABNORMAL HIGH (ref 65–99)
Potassium: 3.5 mmol/L (ref 3.5–5.1)
SODIUM: 139 mmol/L (ref 135–145)
Total Bilirubin: 0.8 mg/dL (ref 0.3–1.2)
Total Protein: 5.8 g/dL — ABNORMAL LOW (ref 6.5–8.1)

## 2016-06-16 LAB — MAGNESIUM: Magnesium: 1.7 mg/dL (ref 1.7–2.4)

## 2016-06-16 LAB — CBC
HCT: 34.9 % — ABNORMAL LOW (ref 39.0–52.0)
HEMOGLOBIN: 12 g/dL — AB (ref 13.0–17.0)
MCH: 30.6 pg (ref 26.0–34.0)
MCHC: 34.4 g/dL (ref 30.0–36.0)
MCV: 89 fL (ref 78.0–100.0)
PLATELETS: 156 10*3/uL (ref 150–400)
RBC: 3.92 MIL/uL — AB (ref 4.22–5.81)
RDW: 13.1 % (ref 11.5–15.5)
WBC: 4.9 10*3/uL (ref 4.0–10.5)

## 2016-06-16 LAB — ECHOCARDIOGRAM COMPLETE
Height: 78 in
WEIGHTICAEL: 3600 [oz_av]

## 2016-06-16 LAB — TROPONIN I

## 2016-06-16 LAB — PHOSPHORUS: Phosphorus: 3.3 mg/dL (ref 2.5–4.6)

## 2016-06-16 MED ORDER — FUROSEMIDE 20 MG PO TABS: 20.0000 mg | ORAL_TABLET | ORAL | 0 refills | Status: DC

## 2016-06-16 NOTE — Progress Notes (Deleted)
CONSULTATION NOTE  Reason for Consult: A-fib with slow ventricular response  Requesting Physician: Dr. Elon Jester  Cardiologist: Dr. Burt Knack  HPI: This is a 76 y.o. male with a past medical history significant for atrial fibrillation. Last seen by Truitt Merle, NP, in 12/2015. He was hospitalized in December 2016 with weakness and palpitations and found to be in A. fib with RVR. He was placed on Cardizem and anticoagulated with Xarelto. CHADSVASC score was felt to be 3. An echocardiogram demonstrated normal systolic function with moderate LVH and a mildly dilated left atrium. He underwent hip replacement in May 2017 which is compensated by hematuria but was not complicated related to his A. Fib. He is now admitted with fatigue, weakness and dizziness, but denies any chest pain. Apparently has history of dementia and may not be appropriately taking his medications. He has been on Eliquis and diltiazem for rate control at home. On admission is noted to be bradycardic. His diltiazem was held due to bradycardia. A repeat echocardiogram was ordered and cardiology was consulted. Troponin negative 3 overnight and labs otherwise unremarkable except for mildly decreased hemoglobin. TSH is normal.  PMHx:  Past Medical History:  Diagnosis Date  . Anxiety   . Atrial fibrillation, new onset ([place]) 03/27/2015  . CAD (coronary artery disease)   . Colon polyp    Cscope at [facility] aprox 2006, repeated 2010 (-), next 5 years  . Diabetes mellitus with neuropathy ([place])   . Diaphragm paralysis    R, per pt   . DJD (degenerative joint disease)   . Erectile dysfunction   . Eye muscle paralysis    congenital, lateral rectus, left  . Heart palpitations   . Hyperlipidemia   . Microscopic hematuria 2017   seeing urology  . Mild cognitive disorder    Unspecified mild NCD  . Skin cancer    sees derm   Past Surgical History:  Procedure Laterality Date  . ANKLE FUSION Right   . APPENDECTOMY    . CARPAL TUNNEL  RELEASE     B, 2000  . CYSTOSCOPY  2017  . HIP SURGERY Right 1964   pins  . HIP SURGERY  2009   RIGHT--s/p hip replacement, s/p revision August 2009 due to dislocation x 3   . HIP SURGERY  04-2012, 2017   R hip revision  . KNEE SURGERY Left 2005  . MEDIAL PARTIAL KNEE REPLACEMENT Right 08-2012  . TOTAL HIP ARTHROPLASTY  08-09-15   [facility]    FAMHx: Family History  Problem Relation Age of Onset  . Dementia Father   . Emphysema Father   . Diabetes Maternal Grandmother   . Heart disease Sister   . Stroke Sister   . Heart attack Neg Hx   . Hypertension Neg Hx     SOCHx:  reports that he quit smoking about 35 years ago. He has never used smokeless tobacco. He reports that he does not drink alcohol or use drugs.  ALLERGIES: Allergies  Allergen Reactions  . Pseudoephedrine Other (See Comments)    Dx: A. Fib.     ROS: Pertinent items noted in HPI and remainder of comprehensive ROS otherwise negative.  HOME MEDICATIONS: No current facility-administered medications on file prior to encounter.    Current Outpatient Prescriptions on File Prior to Encounter  Medication Sig Dispense Refill  . acetaminophen (TYLENOL) 500 MG tablet Take 1,000 mg by mouth every 6 (six) hours as needed for moderate pain.     Marland Kitchen apixaban (  ELIQUIS) 5 MG TABS tablet Take 5 mg by mouth 2 (two) times daily.    Marland Kitchen buPROPion (WELLBUTRIN) 75 MG tablet Take 1 tablet (75 mg total) by mouth 2 (two) times daily. 60 tablet 1  . Cinnamon 500 MG capsule Take 500 mg by mouth 2 (two) times daily.     Marland Kitchen diltiazem (DILACOR XR) 120 MG 24 hr capsule Take 120 mg by mouth daily.    Marland Kitchen donepezil (ARICEPT) 5 MG tablet Take 1 tablet (5 mg total) by mouth at bedtime. 30 tablet 0  . FLUoxetine (PROZAC) 20 MG tablet Take 2 tablets (40 mg total) by mouth daily. 60 tablet 8  . furosemide (LASIX) 20 MG tablet Take 1 tablet (20 mg total) by mouth daily.    . Insulin Glargine (LANTUS SOLOSTAR) 100 UNIT/ML Solostar Pen Inject 15  Units into the skin daily at 10 pm. (Patient taking differently: Inject 5 Units into the skin daily at 10 pm. ) 15 mL 2  . loratadine (CLARITIN) 10 MG tablet Take 10 mg by mouth every morning.     Marland Kitchen losartan (COZAAR) 100 MG tablet Take 1 tablet (100 mg total) by mouth daily.    . metFORMIN (GLUCOPHAGE) 1000 MG tablet Take 1 tablet (1,000 mg total) by mouth 2 (two) times daily with a meal.    . Multiple Vitamin (MULTIVITAMIN) tablet Take 1 tablet by mouth every morning.     Marland Kitchen omega-3 acid ethyl esters (LOVAZA) 1 g capsule Take 2 g by mouth 2 (two) times daily.     Marland Kitchen omeprazole (PRILOSEC) 20 MG capsule Take 1 capsule (20 mg total) by mouth daily.    . simvastatin (ZOCOR) 40 MG tablet Take 40 mg by mouth daily.     . vitamin B-12 (CYANOCOBALAMIN) 250 MCG tablet Take 250 mcg by mouth daily.    . Insulin Pen Needle 31G X 5 MM MISC Inject 15 Units into the skin daily at 10 pm. (Patient not taking: Reported on 04/11/2016) 50 each [place]: I have reviewed the patient's current medications.  VITALS: Blood pressure (!) 155/61, pulse (!) 53, temperature 97.7 F (36.5 C), temperature source Oral, resp. rate 18, height _0  (1.981 m), weight 225 lb (102.1 kg), SpO2 97 %.  PHYSICAL EXAM: General appearance: alert and no distress Neck: no carotid bruit and no JVD Lungs: clear to auscultation bilaterally Heart: Regular bradycardia Abdomen: soft, non-tender; bowel sounds normal; no masses,  no organomegaly Extremities: extremities normal, atraumatic, no cyanosis or edema Pulses: 2+ and symmetric Skin: Skin color, texture, turgor normal. No rashes or lesions Neurologic: Grossly normal  LABS: Results for orders placed or performed during the hospital encounter of 06/15/16 (from the past 48 hour(s))  Basic metabolic panel     Status: Abnormal   Collection Time: 06/15/16  6:26 PM  Result Value Ref Range   Sodium 138 135 - 145 mmol/L   Potassium 3.9 3.5 - 5.1 mmol/L   Chloride 105 101  - 111 mmol/L   CO2 25 22 - 32 mmol/L   Glucose, Bld 160 (H) 65 - 99 mg/dL   BUN 14 6 - 20 mg/dL   Creatinine, Ser 0.70 0.61 - 1.24 mg/dL   Calcium 8.9 8.9 - 10.3 mg/dL   GFR calc non Af Amer >60 >60 mL/min   GFR calc Af Amer >60 >60 mL/min    Comment: (NOTE) The eGFR has been calculated using the CKD EPI equation. This calculation has not been validated  in all clinical situations. eGFR's persistently <60 mL/min signify possible Chronic Kidney Disease.    Anion gap 8 5 - 15  CBC     Status: Abnormal   Collection Time: 06/15/16  6:26 PM  Result Value Ref Range   WBC 6.3 4.0 - 10.5 K/uL   RBC 4.31 4.22 - 5.81 MIL/uL   Hemoglobin 13.6 13.0 - 17.0 g/dL   HCT 38.9 (L) 39.0 - 52.0 %   MCV 90.3 78.0 - 100.0 fL   MCH 31.6 26.0 - 34.0 pg   MCHC 35.0 30.0 - 36.0 g/dL   RDW 13.1 11.5 - 15.5 %   Platelets 167 150 - 400 K/uL  CBG monitoring, ED     Status: Abnormal   Collection Time: 06/15/16  6:40 PM  Result Value Ref Range   Glucose-Capillary 157 (H) 65 - 99 mg/dL  Urinalysis, Routine w reflex microscopic     Status: None   Collection Time: 06/15/16  6:50 PM  Result Value Ref Range   Color, Urine YELLOW YELLOW   APPearance CLEAR CLEAR   Specific Gravity, Urine 1.023 1.005 - 1.030   pH 5.0 5.0 - 8.0   Glucose, UA NEGATIVE NEGATIVE mg/dL   Hgb urine dipstick NEGATIVE NEGATIVE   Bilirubin Urine NEGATIVE NEGATIVE   Ketones, ur NEGATIVE NEGATIVE mg/dL   Protein, ur NEGATIVE NEGATIVE mg/dL   Nitrite NEGATIVE NEGATIVE   Leukocytes, UA NEGATIVE NEGATIVE  I-Stat Troponin, ED (not at [facility])     Status: None   Collection Time: 06/15/16  6:56 PM  Result Value Ref Range   Troponin i, poc 0.00 0.00 - 0.08 ng/mL   Comment 3            Comment: Due to the release kinetics of cTnI, a negative result within the first hours of the onset of symptoms does not rule out myocardial infarction with certainty. If myocardial infarction is still suspected, repeat the test at appropriate intervals.     Troponin I (q 6hr x 3)     Status: None   Collection Time: 06/15/16  9:41 PM  Result Value Ref Range   Troponin I <0.03 <0.03 ng/mL  Glucose, capillary     Status: Abnormal   Collection Time: 06/15/16 10:39 PM  Result Value Ref Range   Glucose-Capillary 160 (H) 65 - 99 mg/dL  Troponin I (q 6hr x 3)     Status: None   Collection Time: 06/16/16  6:00 AM  Result Value Ref Range   Troponin I <0.03 <0.03 ng/mL  Magnesium     Status: None   Collection Time: 06/16/16  6:00 AM  Result Value Ref Range   Magnesium 1.7 1.7 - 2.4 mg/dL  Phosphorus     Status: None   Collection Time: 06/16/16  6:00 AM  Result Value Ref Range   Phosphorus 3.3 2.5 - 4.6 mg/dL  TSH     Status: None   Collection Time: 06/16/16  6:00 AM  Result Value Ref Range   TSH 1.550 0.350 - 4.500 uIU/mL    Comment: Performed by a 3rd Generation assay with a functional sensitivity of <=0.01 uIU/mL.  Comprehensive metabolic panel     Status: Abnormal   Collection Time: 06/16/16  6:00 AM  Result Value Ref Range   Sodium 139 135 - 145 mmol/L   Potassium 3.5 3.5 - 5.1 mmol/L   Chloride 106 101 - 111 mmol/L   CO2 26 22 - 32 mmol/L   Glucose, Bld 112 (H)  65 - 99 mg/dL   BUN 10 6 - 20 mg/dL   Creatinine, Ser 0.66 0.61 - 1.24 mg/dL   Calcium 8.9 8.9 - 10.3 mg/dL   Total Protein 5.8 (L) 6.5 - 8.1 g/dL   Albumin 3.6 3.5 - 5.0 g/dL   AST 13 (L) 15 - 41 U/L   ALT 18 17 - 63 U/L   Alkaline Phosphatase 65 38 - 126 U/L   Total Bilirubin 0.8 0.3 - 1.2 mg/dL   GFR calc non Af Amer >60 >60 mL/min   GFR calc Af Amer >60 >60 mL/min    Comment: (NOTE) The eGFR has been calculated using the CKD EPI equation. This calculation has not been validated in all clinical situations. eGFR's persistently <60 mL/min signify possible Chronic Kidney Disease.    Anion gap 7 5 - 15  CBC     Status: Abnormal   Collection Time: 06/16/16  6:00 AM  Result Value Ref Range   WBC 4.9 4.0 - 10.5 K/uL   RBC 3.92 (L) 4.22 - 5.81 MIL/uL   Hemoglobin  12.0 (L) 13.0 - 17.0 g/dL   HCT 34.9 (L) 39.0 - 52.0 %   MCV 89.0 78.0 - 100.0 fL   MCH 30.6 26.0 - 34.0 pg   MCHC 34.4 30.0 - 36.0 g/dL   RDW 13.1 11.5 - 15.5 %   Platelets 156 150 - 400 K/uL  Glucose, capillary     Status: Abnormal   Collection Time: 06/16/16  7:47 AM  Result Value Ref Range   Glucose-Capillary 122 (H) 65 - 99 mg/dL    IMAGING: Ct Head Wo Contrast  Result Date: 06/15/2016 CLINICAL DATA:  Dizziness and weakness. EXAM: CT HEAD WITHOUT CONTRAST TECHNIQUE: Contiguous axial images were obtained from the base of the skull through the vertex without intravenous contrast. COMPARISON:  MRI 12/17/2015.  Head CT 12/08/2015. FINDINGS: Brain: There is no evidence for acute hemorrhage, hydrocephalus, mass lesion, or abnormal extra-axial fluid collection. No definite CT evidence for acute infarction. Diffuse loss of parenchymal volume is consistent with atrophy. Patchy low attenuation in the deep hemispheric and periventricular white matter is nonspecific, but likely reflects chronic microvascular ischemic demyelination. Vascular: Atherosclerotic calcification is visualized in the carotid arteries. No dense MCA sign. Major dural sinuses are unremarkable. Skull: No evidence for fracture. No worrisome lytic or sclerotic lesion. Sinuses/Orbits: The visualized paranasal sinuses and mastoid air cells are clear. Visualized portions of the globes and intraorbital fat are unremarkable. Other: None. IMPRESSION: Stable.  No acute intracranial abnormality. Atrophy with chronic small vessel white matter ischemic disease. Electronically Signed   By: Misty Stanley M.D.   On: 06/15/2016 19:35   Dg Chest Port 1 View  Result Date: 06/15/2016 CLINICAL DATA:  Weakness and dizziness. EXAM: PORTABLE CHEST 1 VIEW COMPARISON:  05/25/2016 FINDINGS: Low volume film. The lungs are clear wiithout focal pneumonia, edema, pneumothorax or pleural effusion. Cardiopericardial silhouette is at upper limits of normal for size.  Tiny nodular density overlying the posterior right fifth rib may be related to the cardiac lead. The visualized bony structures of the thorax are intact. Telemetry leads overlie the chest. IMPRESSION: Tiny nodular density identified right mid lung, not seen on prior study in possibly related to the cardiac lead. Follow-up PA and lateral chest x-ray after resolution of acute symptoms recommended to re-evaluate. Otherwise No acute findings. Electronically Signed   By: Misty Stanley M.D.   On: 06/15/2016 19:04    HOSPITAL DIAGNOSES: Active Problems:   Diabetes (  [place])   Essential hypertension   Atrial fibrillation ([facility])   Dementia   Bradycardia   IMPRESSION: 1. Symptomatic bradycardia 2. PAF-CHADSVASC score 3 3. Hypertension  RECOMMENDATION: 1. Mr. Signor presented with what sounds like symptomatic bradycardia. He reports several weeks of fatigue, dizziness and presyncopal symptoms. Heart rate was in the 40s on admission. He is in sinus rhythm. Diltiazem was held in heart rate is improved today. He is at risk for possible tachybradycardia syndrome and recurrent A. fib with RVR, but at this point I'm not certain that he will need a pacemaker. Symptomatically he is improved. I would see how he does with ambulation today and if he continues to maintain an adequate heart rate could likely be discharged later today. He has a scheduled follow-up appointment with Dr. Burt Knack already in about 2 weeks.  Thanks for the consultation.  Time Spent Directly with Patient: 30 minutes  Pixie Casino, MD, [facility] Attending Cardiologist [place] 06/16/2016, 8:22 AM

## 2016-06-16 NOTE — Progress Notes (Signed)
*  PRELIMINARY RESULTS* Echocardiogram 2D Echocardiogram has been performed.  Ivan Anderson 06/16/2016, 12:04 PM

## 2016-06-16 NOTE — Consult Note (Signed)
CONSULTATION NOTE  Reason for Consult: A-fib with slow ventricular response  Requesting Physician: Dr. Elon Jester  Cardiologist: Dr. Burt Knack  HPI: This is a 76 y.o. male with a past medical history significant for atrial fibrillation. Last seen by Truitt Merle, NP, in 12/2015. He was hospitalized in December 2016 with weakness and palpitations and found to be in A. fib with RVR. He was placed on Cardizem and anticoagulated with Xarelto. CHADSVASC score was felt to be 3. An echocardiogram demonstrated normal systolic function with moderate LVH and a mildly dilated left atrium. He underwent hip replacement in May 2017 which is compensated by hematuria but was not complicated related to his A. Fib. He is now admitted with fatigue, weakness and dizziness, but denies any chest pain. Apparently has history of dementia and may not be appropriately taking his medications. He has been on Eliquis and diltiazem for rate control at home. On admission is noted to be bradycardic. His diltiazem was held due to bradycardia. A repeat echocardiogram was ordered and cardiology was consulted. Troponin negative 3 overnight and labs otherwise unremarkable except for mildly decreased hemoglobin. TSH is normal.  PMHx:  Past Medical History:  Diagnosis Date  . Anxiety   . Atrial fibrillation, new onset ([place]) 03/27/2015  . CAD (coronary artery disease)   . Colon polyp    Cscope at [facility] aprox 2006, repeated 2010 (-), next 5 years  . Diabetes mellitus with neuropathy ([place])   . Diaphragm paralysis    R, per pt   . DJD (degenerative joint disease)   . Erectile dysfunction   . Eye muscle paralysis    congenital, lateral rectus, left  . Heart palpitations   . Hyperlipidemia   . Microscopic hematuria 2017   seeing urology  . Mild cognitive disorder    Unspecified mild NCD  . Skin cancer    sees derm   Past Surgical History:  Procedure Laterality Date  . ANKLE FUSION Right   . APPENDECTOMY    . CARPAL TUNNEL  RELEASE     B, 2000  . CYSTOSCOPY  2017  . HIP SURGERY Right 1964   pins  . HIP SURGERY  2009   RIGHT--s/p hip replacement, s/p revision August 2009 due to dislocation x 3   . HIP SURGERY  04-2012, 2017   R hip revision  . KNEE SURGERY Left 2005  . MEDIAL PARTIAL KNEE REPLACEMENT Right 08-2012  . TOTAL HIP ARTHROPLASTY  08-09-15   [facility]    FAMHx: Family History  Problem Relation Age of Onset  . Dementia Father   . Emphysema Father   . Diabetes Maternal Grandmother   . Heart disease Sister   . Stroke Sister   . Heart attack Neg Hx   . Hypertension Neg Hx     SOCHx:  reports that he quit smoking about 35 years ago. He has never used smokeless tobacco. He reports that he does not drink alcohol or use drugs.  ALLERGIES: Allergies  Allergen Reactions  . Pseudoephedrine Other (See Comments)    Dx: A. Fib.     ROS: Pertinent items noted in HPI and remainder of comprehensive ROS otherwise negative.  HOME MEDICATIONS: No current facility-administered medications on file prior to encounter.    Current Outpatient Prescriptions on File Prior to Encounter  Medication Sig Dispense Refill  . acetaminophen (TYLENOL) 500 MG tablet Take 1,000 mg by mouth every 6 (six) hours as needed for moderate pain.     Marland Kitchen apixaban (  ELIQUIS) 5 MG TABS tablet Take 5 mg by mouth 2 (two) times daily.    Marland Kitchen buPROPion (WELLBUTRIN) 75 MG tablet Take 1 tablet (75 mg total) by mouth 2 (two) times daily. 60 tablet 1  . Cinnamon 500 MG capsule Take 500 mg by mouth 2 (two) times daily.     Marland Kitchen diltiazem (DILACOR XR) 120 MG 24 hr capsule Take 120 mg by mouth daily.    Marland Kitchen donepezil (ARICEPT) 5 MG tablet Take 1 tablet (5 mg total) by mouth at bedtime. 30 tablet 0  . FLUoxetine (PROZAC) 20 MG tablet Take 2 tablets (40 mg total) by mouth daily. 60 tablet 8  . furosemide (LASIX) 20 MG tablet Take 1 tablet (20 mg total) by mouth daily.    . Insulin Glargine (LANTUS SOLOSTAR) 100 UNIT/ML Solostar Pen Inject 15  Units into the skin daily at 10 pm. (Patient taking differently: Inject 5 Units into the skin daily at 10 pm. ) 15 mL 2  . loratadine (CLARITIN) 10 MG tablet Take 10 mg by mouth every morning.     Marland Kitchen losartan (COZAAR) 100 MG tablet Take 1 tablet (100 mg total) by mouth daily.    . metFORMIN (GLUCOPHAGE) 1000 MG tablet Take 1 tablet (1,000 mg total) by mouth 2 (two) times daily with a meal.    . Multiple Vitamin (MULTIVITAMIN) tablet Take 1 tablet by mouth every morning.     Marland Kitchen omega-3 acid ethyl esters (LOVAZA) 1 g capsule Take 2 g by mouth 2 (two) times daily.     Marland Kitchen omeprazole (PRILOSEC) 20 MG capsule Take 1 capsule (20 mg total) by mouth daily.    . simvastatin (ZOCOR) 40 MG tablet Take 40 mg by mouth daily.     . vitamin B-12 (CYANOCOBALAMIN) 250 MCG tablet Take 250 mcg by mouth daily.    . Insulin Pen Needle 31G X 5 MM MISC Inject 15 Units into the skin daily at 10 pm. (Patient not taking: Reported on 04/11/2016) 50 each [place]: I have reviewed the patient's current medications.  VITALS: Blood pressure (!) 155/61, pulse (!) 53, temperature 97.7 F (36.5 C), temperature source Oral, resp. rate 18, height _0  (1.981 m), weight 225 lb (102.1 kg), SpO2 97 %.  PHYSICAL EXAM: General appearance: alert and no distress Neck: no carotid bruit and no JVD Lungs: clear to auscultation bilaterally Heart: Regular bradycardia Abdomen: soft, non-tender; bowel sounds normal; no masses,  no organomegaly Extremities: extremities normal, atraumatic, no cyanosis or edema Pulses: 2+ and symmetric Skin: Skin color, texture, turgor normal. No rashes or lesions Neurologic: Grossly normal  LABS: Results for orders placed or performed during the hospital encounter of 06/15/16 (from the past 48 hour(s))  Basic metabolic panel     Status: Abnormal   Collection Time: 06/15/16  6:26 PM  Result Value Ref Range   Sodium 138 135 - 145 mmol/L   Potassium 3.9 3.5 - 5.1 mmol/L   Chloride 105 101  - 111 mmol/L   CO2 25 22 - 32 mmol/L   Glucose, Bld 160 (H) 65 - 99 mg/dL   BUN 14 6 - 20 mg/dL   Creatinine, Ser 0.70 0.61 - 1.24 mg/dL   Calcium 8.9 8.9 - 10.3 mg/dL   GFR calc non Af Amer >60 >60 mL/min   GFR calc Af Amer >60 >60 mL/min    Comment: (NOTE) The eGFR has been calculated using the CKD EPI equation. This calculation has not been validated  in all clinical situations. eGFR's persistently <60 mL/min signify possible Chronic Kidney Disease.    Anion gap 8 5 - 15  CBC     Status: Abnormal   Collection Time: 06/15/16  6:26 PM  Result Value Ref Range   WBC 6.3 4.0 - 10.5 K/uL   RBC 4.31 4.22 - 5.81 MIL/uL   Hemoglobin 13.6 13.0 - 17.0 g/dL   HCT 38.9 (L) 39.0 - 52.0 %   MCV 90.3 78.0 - 100.0 fL   MCH 31.6 26.0 - 34.0 pg   MCHC 35.0 30.0 - 36.0 g/dL   RDW 13.1 11.5 - 15.5 %   Platelets 167 150 - 400 K/uL  CBG monitoring, ED     Status: Abnormal   Collection Time: 06/15/16  6:40 PM  Result Value Ref Range   Glucose-Capillary 157 (H) 65 - 99 mg/dL  Urinalysis, Routine w reflex microscopic     Status: None   Collection Time: 06/15/16  6:50 PM  Result Value Ref Range   Color, Urine YELLOW YELLOW   APPearance CLEAR CLEAR   Specific Gravity, Urine 1.023 1.005 - 1.030   pH 5.0 5.0 - 8.0   Glucose, UA NEGATIVE NEGATIVE mg/dL   Hgb urine dipstick NEGATIVE NEGATIVE   Bilirubin Urine NEGATIVE NEGATIVE   Ketones, ur NEGATIVE NEGATIVE mg/dL   Protein, ur NEGATIVE NEGATIVE mg/dL   Nitrite NEGATIVE NEGATIVE   Leukocytes, UA NEGATIVE NEGATIVE  I-Stat Troponin, ED (not at [facility])     Status: None   Collection Time: 06/15/16  6:56 PM  Result Value Ref Range   Troponin i, poc 0.00 0.00 - 0.08 ng/mL   Comment 3            Comment: Due to the release kinetics of cTnI, a negative result within the first hours of the onset of symptoms does not rule out myocardial infarction with certainty. If myocardial infarction is still suspected, repeat the test at appropriate intervals.     Troponin I (q 6hr x 3)     Status: None   Collection Time: 06/15/16  9:41 PM  Result Value Ref Range   Troponin I <0.03 <0.03 ng/mL  Glucose, capillary     Status: Abnormal   Collection Time: 06/15/16 10:39 PM  Result Value Ref Range   Glucose-Capillary 160 (H) 65 - 99 mg/dL  Troponin I (q 6hr x 3)     Status: None   Collection Time: 06/16/16  6:00 AM  Result Value Ref Range   Troponin I <0.03 <0.03 ng/mL  Magnesium     Status: None   Collection Time: 06/16/16  6:00 AM  Result Value Ref Range   Magnesium 1.7 1.7 - 2.4 mg/dL  Phosphorus     Status: None   Collection Time: 06/16/16  6:00 AM  Result Value Ref Range   Phosphorus 3.3 2.5 - 4.6 mg/dL  TSH     Status: None   Collection Time: 06/16/16  6:00 AM  Result Value Ref Range   TSH 1.550 0.350 - 4.500 uIU/mL    Comment: Performed by a 3rd Generation assay with a functional sensitivity of <=0.01 uIU/mL.  Comprehensive metabolic panel     Status: Abnormal   Collection Time: 06/16/16  6:00 AM  Result Value Ref Range   Sodium 139 135 - 145 mmol/L   Potassium 3.5 3.5 - 5.1 mmol/L   Chloride 106 101 - 111 mmol/L   CO2 26 22 - 32 mmol/L   Glucose, Bld 112 (H)  65 - 99 mg/dL   BUN 10 6 - 20 mg/dL   Creatinine, Ser 0.66 0.61 - 1.24 mg/dL   Calcium 8.9 8.9 - 10.3 mg/dL   Total Protein 5.8 (L) 6.5 - 8.1 g/dL   Albumin 3.6 3.5 - 5.0 g/dL   AST 13 (L) 15 - 41 U/L   ALT 18 17 - 63 U/L   Alkaline Phosphatase 65 38 - 126 U/L   Total Bilirubin 0.8 0.3 - 1.2 mg/dL   GFR calc non Af Amer >60 >60 mL/min   GFR calc Af Amer >60 >60 mL/min    Comment: (NOTE) The eGFR has been calculated using the CKD EPI equation. This calculation has not been validated in all clinical situations. eGFR's persistently <60 mL/min signify possible Chronic Kidney Disease.    Anion gap 7 5 - 15  CBC     Status: Abnormal   Collection Time: 06/16/16  6:00 AM  Result Value Ref Range   WBC 4.9 4.0 - 10.5 K/uL   RBC 3.92 (L) 4.22 - 5.81 MIL/uL   Hemoglobin  12.0 (L) 13.0 - 17.0 g/dL   HCT 34.9 (L) 39.0 - 52.0 %   MCV 89.0 78.0 - 100.0 fL   MCH 30.6 26.0 - 34.0 pg   MCHC 34.4 30.0 - 36.0 g/dL   RDW 13.1 11.5 - 15.5 %   Platelets 156 150 - 400 K/uL  Glucose, capillary     Status: Abnormal   Collection Time: 06/16/16  7:47 AM  Result Value Ref Range   Glucose-Capillary 122 (H) 65 - 99 mg/dL    IMAGING: Ct Head Wo Contrast  Result Date: 06/15/2016 CLINICAL DATA:  Dizziness and weakness. EXAM: CT HEAD WITHOUT CONTRAST TECHNIQUE: Contiguous axial images were obtained from the base of the skull through the vertex without intravenous contrast. COMPARISON:  MRI 12/17/2015.  Head CT 12/08/2015. FINDINGS: Brain: There is no evidence for acute hemorrhage, hydrocephalus, mass lesion, or abnormal extra-axial fluid collection. No definite CT evidence for acute infarction. Diffuse loss of parenchymal volume is consistent with atrophy. Patchy low attenuation in the deep hemispheric and periventricular white matter is nonspecific, but likely reflects chronic microvascular ischemic demyelination. Vascular: Atherosclerotic calcification is visualized in the carotid arteries. No dense MCA sign. Major dural sinuses are unremarkable. Skull: No evidence for fracture. No worrisome lytic or sclerotic lesion. Sinuses/Orbits: The visualized paranasal sinuses and mastoid air cells are clear. Visualized portions of the globes and intraorbital fat are unremarkable. Other: None. IMPRESSION: Stable.  No acute intracranial abnormality. Atrophy with chronic small vessel white matter ischemic disease. Electronically Signed   By: Misty Stanley M.D.   On: 06/15/2016 19:35   Dg Chest Port 1 View  Result Date: 06/15/2016 CLINICAL DATA:  Weakness and dizziness. EXAM: PORTABLE CHEST 1 VIEW COMPARISON:  05/25/2016 FINDINGS: Low volume film. The lungs are clear wiithout focal pneumonia, edema, pneumothorax or pleural effusion. Cardiopericardial silhouette is at upper limits of normal for size.  Tiny nodular density overlying the posterior right fifth rib may be related to the cardiac lead. The visualized bony structures of the thorax are intact. Telemetry leads overlie the chest. IMPRESSION: Tiny nodular density identified right mid lung, not seen on prior study in possibly related to the cardiac lead. Follow-up PA and lateral chest x-ray after resolution of acute symptoms recommended to re-evaluate. Otherwise No acute findings. Electronically Signed   By: Misty Stanley M.D.   On: 06/15/2016 19:04    HOSPITAL DIAGNOSES: Active Problems:   Diabetes (  [place])   Essential hypertension   Atrial fibrillation (H[facility])   Dementia   Bradycardia   IMPRESSION: 1. Symptomatic bradycardia 2. PAF-CHADSVASC score 3 3. Hypertension  RECOMMENDATION: 1. Mr. Korol presented with what sounds like symptomatic bradycardia. He reports several weeks of fatigue, dizziness and presyncopal symptoms. Heart rate was in the 40s on admission. He is in sinus rhythm. Diltiazem was held in heart rate is improved today. He is at risk for possible tachybradycardia syndrome and recurrent A. fib with RVR, but at this point I'm not certain that he will need a pacemaker. Symptomatically he is improved. I would see how he does with ambulation today and if he continues to maintain an adequate heart rate could likely be discharged later today. He has a scheduled follow-up appointment with Dr. Burt Knack already in about 2 weeks.  Thanks for the consultation.  Time Spent Directly with Patient: 30 minutes  Pixie Casino, MD, [facility] Attending Cardiologist [place] 06/16/2016, 8:43 AM

## 2016-06-16 NOTE — Discharge Summary (Signed)
Physician Discharge Summary  Ivan Anderson  STM:[id number]  DOB: [birth year]  DOA: 06/15/2016 PCP: [name], MD  Admit date: 06/15/2016 Discharge date: 06/16/2016  Admitted From: Home  Disposition:  Home   Recommendations for Outpatient Follow-up:  1. Follow up with PCP in 1-2 weeks 2. Please obtain BMP/CBC in one weekTo monitor hemoglobin and renal function 3. Follow-up with cardiology see in 2 weeks.  Discharge Condition: Stable   CODE STATUS: DNR Diet recommendation: Heart Healthy   Brief/Interim Summary: 76 year old male with medical history of diabetes type 2, CAD, hypertension, A. fib on anticoagulation, dementia presented to the ED with weakness and dizziness. Patient was admitted with symptomatic bradycardia found to have heart rate on the 40s. Patient reported he is not sure he actually took too much Cardizem the day before admission. He reports that he could probably have took 2 pills instead of 1. Cardiology evaluation was done which recommended to monitor and no need for pacemaker at this point. Patient have to schedule follow-up appointment with his cardiology Dr. Burt Knack in about 2 weeks. During the hospital stay patient remained stable hear rate came up to baseline which for this patient is about 50-60. His EKG was sinus with first-degree AV block. Patient was ambulated and orthostatic vitals were obtained with no significant changes. Patient report improvement weakness or IV hydration. Patient is deemed safe to be discharged to follow-up with his primary care provider which she has a follow-up appointment this coming week. He will continue on his current medications with no need for changes on regimen.  Subjective: Patient seen and examined. Have no complaints. He reported he is back to baseline. Patient is afebrile during hospital stay. No acute events overnight. Tolerating diet well and ambulating with no problems.  Discharge Diagnoses/Hospital Course:  Symptomatic bradycardia -  improved with IV fluids and held of Cardizem. Heart rate back to baseline Follow-up with cardiology, no need for pacemaker at this time. Patient has symptomatically improved. Echocardiogram was performed with no significant change compare to prior - EF 60-65% no wall motion abnormality  PAF - CHADSVASC 3  Continue Cardizem Continue Eliquis  Follow up with PCP  Hypertension - BP stable Continue home medications Monitor BP  Dementia - stable Continue home medication Follow-up with PCP  All other chronic medical condition were stable during the hospitalization.  On the day of the discharge the patient's vitals were stable, and no other acute medical condition were reported by patient. Patient was felt safe to be discharge to home  Discharge Instructions  You were cared for by a hospitalist during your hospital stay. If you have any questions about your discharge medications or the care you received while you were in the hospital after you are discharged, you can call the unit and asked to speak with the hospitalist on call if the hospitalist that took care of you is not available. Once you are discharged, your primary care physician will handle any further medical issues. Please note that NO REFILLS for any discharge medications will be authorized once you are discharged, as it is imperative that you return to your primary care physician (or establish a relationship with a primary care physician if you do not have one) for your aftercare needs so that they can reassess your need for medications and monitor your lab values.  Discharge Instructions    Call MD for:  difficulty breathing, headache or visual disturbances    Complete by:  As directed    Call MD  for:  extreme fatigue    Complete by:  As directed    Call MD for:  hives    Complete by:  As directed    Call MD for:  persistant dizziness or light-headedness    Complete by:  As directed    Call MD for:  persistant nausea and  vomiting    Complete by:  As directed    Call MD for:  redness, tenderness, or signs of infection (pain, swelling, redness, odor or green/yellow discharge around incision site)    Complete by:  As directed    Call MD for:  severe uncontrolled pain    Complete by:  As directed    Call MD for:  temperature >100.4    Complete by:  As directed    Diet - low sodium heart healthy    Complete by:  As directed    Increase activity slowly    Complete by:  As directed      Allergies as of 06/16/2016      Reactions   Pseudoephedrine Other (See Comments)   Dx: A. Fib.       Medication List    TAKE these medications   acetaminophen 500 MG tablet Commonly known as:  TYLENOL Take 1,000 mg by mouth every 6 (six) hours as needed for moderate pain.   buPROPion 75 MG tablet Commonly known as:  WELLBUTRIN Take 1 tablet (75 mg total) by mouth 2 (two) times daily.   Cinnamon 500 MG capsule Take 500 mg by mouth 2 (two) times daily.   diltiazem 120 MG 24 hr capsule Commonly known as:  DILACOR XR Take 120 mg by mouth daily.   donepezil 5 MG tablet Commonly known as:  ARICEPT Take 1 tablet (5 mg total) by mouth at bedtime.   ELIQUIS 5 MG Tabs tablet Generic drug:  apixaban Take 5 mg by mouth 2 (two) times daily.   FLUoxetine 20 MG tablet Commonly known as:  PROZAC Take 2 tablets (40 mg total) by mouth daily.   furosemide 20 MG tablet Commonly known as:  LASIX Take 1 tablet (20 mg total) by mouth every other day. What changed:  when to take this   Insulin Glargine 100 UNIT/ML Solostar Pen Commonly known as:  LANTUS SOLOSTAR Inject 15 Units into the skin daily at 10 pm. What changed:  how much to take   Insulin Pen Needle 31G X 5 MM Misc Inject 15 Units into the skin daily at 10 pm.   loratadine 10 MG tablet Commonly known as:  CLARITIN Take 10 mg by mouth every morning.   losartan 100 MG tablet Commonly known as:  COZAAR Take 1 tablet (100 mg total) by mouth daily.    metFORMIN 1000 MG tablet Commonly known as:  GLUCOPHAGE Take 1 tablet (1,000 mg total) by mouth 2 (two) times daily with a meal.   multivitamin tablet Take 1 tablet by mouth every morning.   naproxen sodium 220 MG tablet Commonly known as:  ANAPROX Take 220 mg by mouth daily as needed (pain).   omega-3 acid ethyl esters 1 g capsule Commonly known as:  LOVAZA Take 2 g by mouth 2 (two) times daily.   omeprazole 20 MG capsule Commonly known as:  PRILOSEC Take 1 capsule (20 mg total) by mouth daily.   polyvinyl alcohol 1.4 % ophthalmic solution Commonly known as:  LIQUIFILM TEARS Place 1 drop into both eyes 3 (three) times daily as needed for dry eyes.   simvastatin  40 MG tablet Commonly known as:  ZOCOR Take 40 mg by mouth daily.   vitamin B-12 250 MCG tablet Commonly known as:  CYANOCOBALAMIN Take 250 mcg by mouth daily.      Follow-up Information    Sherren Mocha, MD. Schedule an appointment as soon as possible for a visit in 2 week(s).   Specialty:  Cardiology Contact information: [address] [address] [phone]        Jose Paz, MD. Go in 1 week(s).   Specialty:  Internal Medicine Contact information: [address] [place] [address] [phone]          Allergies  Allergen Reactions  . Pseudoephedrine Other (See Comments)    Dx: A. Fib.     Consultations:  Cardiology   Procedures/Studies: Dg Chest 2 View  Result Date: 05/25/2016 CLINICAL DATA:  Exhaustion weight loss EXAM: CHEST  2 VIEW COMPARISON:  Chest radiograph 12/17/2015 FINDINGS: The lungs are well inflated. Cardiomediastinal contours are normal. No pneumothorax or pleural effusion. No focal airspace consolidation or pulmonary edema. IMPRESSION: No active cardiopulmonary disease. Electronically Signed   By: Ulyses Jarred M.D.   On: 05/25/2016 16:05   Ct Head Wo Contrast  Result Date: 06/15/2016 CLINICAL DATA:  Dizziness and  weakness. EXAM: CT HEAD WITHOUT CONTRAST TECHNIQUE: Contiguous axial images were obtained from the base of the skull through the vertex without intravenous contrast. COMPARISON:  MRI 12/17/2015.  Head CT 12/08/2015. FINDINGS: Brain: There is no evidence for acute hemorrhage, hydrocephalus, mass lesion, or abnormal extra-axial fluid collection. No definite CT evidence for acute infarction. Diffuse loss of parenchymal volume is consistent with atrophy. Patchy low attenuation in the deep hemispheric and periventricular white matter is nonspecific, but likely reflects chronic microvascular ischemic demyelination. Vascular: Atherosclerotic calcification is visualized in the carotid arteries. No dense MCA sign. Major dural sinuses are unremarkable. Skull: No evidence for fracture. No worrisome lytic or sclerotic lesion. Sinuses/Orbits: The visualized paranasal sinuses and mastoid air cells are clear. Visualized portions of the globes and intraorbital fat are unremarkable. Other: None. IMPRESSION: Stable.  No acute intracranial abnormality. Atrophy with chronic small vessel white matter ischemic disease. Electronically Signed   By: Misty Stanley M.D.   On: 06/15/2016 19:35   Dg Chest Port 1 View  Result Date: 06/15/2016 CLINICAL DATA:  Weakness and dizziness. EXAM: PORTABLE CHEST 1 VIEW COMPARISON:  05/25/2016 FINDINGS: Low volume film. The lungs are clear wiithout focal pneumonia, edema, pneumothorax or pleural effusion. Cardiopericardial silhouette is at upper limits of normal for size. Tiny nodular density overlying the posterior right fifth rib may be related to the cardiac lead. The visualized bony structures of the thorax are intact. Telemetry leads overlie the chest. IMPRESSION: Tiny nodular density identified right mid lung, not seen on prior study in possibly related to the cardiac lead. Follow-up PA and lateral chest x-ray after resolution of acute symptoms recommended to re-evaluate. Otherwise No acute  findings. Electronically Signed   By: Misty Stanley M.D.   On: 06/15/2016 19:04    ECHO  ------------------------------------------------------------------- Study Conclusions  - Left ventricle: The cavity size was normal. There was moderate   concentric hypertrophy. Systolic function was normal. The   estimated ejection fraction was in the range of 60% to 65%. Wall   motion was normal; there were no regional wall motion   abnormalities. Doppler parameters are consistent with abnormal   left ventricular relaxation (grade 1 diastolic dysfunction). The  E/e&' ratio is <8, suggesting normal LV filling pressure. - Aortic valve: Trileaflet. Sclerosis without stenosis. There was   no regurgitation. - Left atrium: The atrium was normal in size. - Right atrium: The atrium was normal in size. - Inferior vena cava: The vessel was normal in size. The   respirophasic diameter changes were in the normal range (>= 50%),   consistent with normal central venous pressure.  Impressions:  - Compared to a prior study in 2016, the LVEF is relatively stable.   There is no significant atrial enlargement or new wall motion   abnormalities.  Discharge Exam: Vitals:   06/15/16 2230 06/16/16 0445  BP: (!) 172/66 (!) 155/61  Pulse: (!) 58 (!) 53  Resp:  18  Temp: 97.7 F (36.5 C) 97.7 F (36.5 C)   Vitals:   06/15/16 2130 06/15/16 2145 06/15/16 2230 06/16/16 0445  BP: 185/69 (!) 140/102 (!) 172/66 (!) 155/61  Pulse: (!) 56 (!) 57 (!) 58 (!) 53  Resp: 23 22  18   Temp:   97.7 F (36.5 C) 97.7 F (36.5 C)  TempSrc:   Oral Oral  SpO2: 98% 95% 97% 97%  Weight:   102.1 kg (225 lb)   Height:   6\' 6"  (1.981 m)     General: Pt is alert, awake, not in acute distress Cardiovascular: RRR, S1/S2 +, no rubs, no gallops Respiratory: CTA bilaterally, no wheezing, no rhonchi Abdominal: Soft, NT, ND, bowel sounds + Extremities: no edema, no cyanosis    The results of significant diagnostics from  this hospitalization (including imaging, microbiology, ancillary and laboratory) are listed below for reference.     Microbiology: No results found for this or any previous visit (from the past 240 hour(s)).   Labs: BNP (last 3 results) No results for input(s): BNP in the last 8760 hours. Basic Metabolic Panel:  Recent Labs Lab 06/15/16 1826 06/16/16 0600  NA 138 139  K 3.9 3.5  CL 105 106  CO2 25 26  GLUCOSE 160* 112*  BUN 14 10  CREATININE 0.70 0.66  CALCIUM 8.9 8.9  MG  --  1.7  PHOS  --  3.3   Liver Function Tests:  Recent Labs Lab 06/16/16 0600  AST 13*  ALT 18  ALKPHOS 65  BILITOT 0.8  PROT 5.8*  ALBUMIN 3.6   No results for input(s): LIPASE, AMYLASE in the last 168 hours. No results for input(s): AMMONIA in the last 168 hours. CBC:  Recent Labs Lab 06/15/16 1826 06/16/16 0600  WBC 6.3 4.9  HGB 13.6 12.0*  HCT 38.9* 34.9*  MCV 90.3 89.0  PLT 167 156   Cardiac Enzymes:  Recent Labs Lab 06/15/16 2141 06/16/16 0600 06/16/16 0903  TROPONINI <0.03 <0.03 <0.03   BNP: Invalid input(s): POCBNP CBG:  Recent Labs Lab 06/15/16 1840 06/15/16 2239 06/16/16 0747 06/16/16 1129  GLUCAP 157* 160* 122* 146*   D-Dimer No results for input(s): DDIMER in the last 72 hours. Hgb A1c No results for input(s): HGBA1C in the last 72 hours. Lipid Profile No results for input(s): CHOL, HDL, LDLCALC, TRIG, CHOLHDL, LDLDIRECT in the last 72 hours. Thyroid function studies  Recent Labs  06/16/16 0600  TSH 1.550   Anemia work up No results for input(s): VITAMINB12, FOLATE, FERRITIN, TIBC, IRON, RETICCTPCT in the last 72 hours. Urinalysis    Component Value Date/Time   COLORURINE YELLOW 06/15/2016 1850   APPEARANCEUR CLEAR 06/15/2016 1850   LABSPEC 1.023 06/15/2016 1850   PHURINE 5.0 06/15/2016  [place] 06/15/2016 [place] 12/27/2015 [place] 06/15/2016 [place] 06/15/2016 [place] 06/15/2016 1850   PROTEINUR NEGATIVE 06/15/2016 1850   UROBILINOGEN 1.0 12/27/2015 1240   NITRITE NEGATIVE 06/15/2016 1850   LEUKOCYTESUR NEGATIVE 06/15/2016 1850   Sepsis Labs Invalid input(s): PROCALCITONIN,  WBC,  LACTICIDVEN Microbiology No results found for this or any previous visit (from the past 240 hour(s)).   Time coordinating discharge: 35 minutes  SIGNED:  Chipper Oman, MD  [facility] 06/16/2016, 2:01 PM  Pager please text page via  [email or web address] Password [email or web address]

## 2016-06-16 NOTE — Progress Notes (Signed)
PT Cancellation Note  Patient Details Name: Ivan Anderson MRN: [id number] DOB: 08/23/1940   Cancelled Treatment:    Reason Eval/Treat Not Completed: PT screened, no needs identified, will sign off (amb with nursing earlier, no assist needed; IND at baseline)   [facility] 06/16/2016, 2:49 PM

## 2016-06-18 ENCOUNTER — Telehealth: Payer: Self-pay | Admitting: *Deleted

## 2016-06-18 NOTE — Telephone Encounter (Signed)
Unable to reach patient at time of TCM Call. Left message for patient to return call when available.  

## 2016-06-19 NOTE — Telephone Encounter (Signed)
thx

## 2016-06-19 NOTE — Telephone Encounter (Signed)
Transition Care Management Follow-up Telephone Call  PCP: [name], MD  Admit date: 06/15/2016 Discharge date: 06/16/2016  Admitted From: Home  Disposition:  Home   Recommendations for Outpatient Follow-up:  1. Follow up with PCP in 1-2 weeks 2. Please obtain BMP/CBC in one weekTo monitor hemoglobin and renal function 3. Follow-up with cardiology see in 2 weeks.  Discharge Condition: Stable     How have you been since you were released from the hospital? Patient stated, "I'm doing really good".   Do you understand why you were in the hospital? yes   Do you understand the discharge instructions? yes   Where were you discharged to? Home   Items Reviewed:  Medications reviewed: yes  Allergies reviewed: yes  Dietary changes reviewed: yes, heart healthy diet  Referrals reviewed: yes   Functional Questionnaire:   Activities of Daily Living (ADLs):   He states they are independent in the following: ambulation, bathing and hygiene, feeding, continence, grooming, toileting and dressing States they require assistance with the following: None   Any transportation issues/concerns?: no   Any patient concerns? no   Confirmed importance and date/time of follow-up visits scheduled yes, 06/22/16 at 2:15 PM.  Provider Appointment booked with Dr. Larose Kells.  Confirmed with patient if condition begins to worsen call PCP or go to the ER.  Patient was given the office number and encouraged to call back with question or concerns.  : yes

## 2016-06-22 ENCOUNTER — Encounter: Payer: Self-pay | Admitting: Internal Medicine

## 2016-06-22 ENCOUNTER — Ambulatory Visit (INDEPENDENT_AMBULATORY_CARE_PROVIDER_SITE_OTHER): Payer: Medicare Other | Admitting: Internal Medicine

## 2016-06-22 VITALS — BP 128/66 | HR 61 | Temp 98.2°F | Resp 14 | Ht 78.0 in | Wt 231.5 lb

## 2016-06-22 DIAGNOSIS — F0150 Vascular dementia without behavioral disturbance: Secondary | ICD-10-CM

## 2016-06-22 DIAGNOSIS — R5383 Other fatigue: Secondary | ICD-10-CM

## 2016-06-22 DIAGNOSIS — R001 Bradycardia, unspecified: Secondary | ICD-10-CM | POA: Diagnosis not present

## 2016-06-22 DIAGNOSIS — D649 Anemia, unspecified: Secondary | ICD-10-CM

## 2016-06-22 DIAGNOSIS — R42 Dizziness and giddiness: Secondary | ICD-10-CM

## 2016-06-22 LAB — CBC WITH DIFFERENTIAL/PLATELET
Basophils Absolute: 58 cells/uL (ref 0–200)
Basophils Relative: 1 %
EOS PCT: 1 %
Eosinophils Absolute: 58 cells/uL (ref 15–500)
HEMATOCRIT: 40.3 % (ref 38.5–50.0)
Hemoglobin: 13.6 g/dL (ref 13.2–17.1)
Lymphocytes Relative: 21 %
Lymphs Abs: 1218 cells/uL (ref 850–3900)
MCH: 31.1 pg (ref 27.0–33.0)
MCHC: 33.7 g/dL (ref 32.0–36.0)
MCV: 92.2 fL (ref 80.0–100.0)
MONO ABS: 580 {cells}/uL (ref 200–950)
MPV: 8.7 fL (ref 7.5–12.5)
Monocytes Relative: 10 %
NEUTROS PCT: 67 %
Neutro Abs: 3886 cells/uL (ref 1500–7800)
Platelets: 199 10*3/uL (ref 140–400)
RBC: 4.37 MIL/uL (ref 4.20–5.80)
RDW: 13.7 % (ref 11.0–15.0)
WBC: 5.8 10*3/uL (ref 3.8–10.8)

## 2016-06-22 LAB — IRON: IRON: 81 ug/dL (ref 50–180)

## 2016-06-22 NOTE — Progress Notes (Signed)
Pre visit review using our clinic review tool, if applicable. No additional management support is needed unless otherwise documented below in the visit note. 

## 2016-06-22 NOTE — Progress Notes (Signed)
Subjective:    Patient ID: Ivan Anderson, male    DOB: Jan 22, 1941, 76 y.o.   MRN: [id number]  DOS:  06/22/2016 Type of visit - description :  TCM 7 Interval history: Admitted 06/15/2016, discharge the next day. Was admitted with symptomatic bradycardia, heart rate in the 40s. Question of inadvertently taking more Cardizem than what is prescribed. He improved after IV fluids and held Cardizem. Cards was consulted, no need for a pacemaker. He was restarted on Cardizem before being discharged. Echo was repeated, at baseline. Labs reviewed, mild anemia noted, dilutional? CT head negative   Review of Systems  Since he left the hospital he is feeling somewhat better but still feels in general weak and dizzy. Dizziness is on and off, some times at rest, sometimes when he stands up. Has felt dizzy the first in the morning when he opened the eyes.  Denies diplopia, facial numbness, slurred speech or focal deficits. Memory is getting progressively worse   Past Medical History:  Diagnosis Date  . Anxiety   . Atrial fibrillation, new onset ([place]) 03/27/2015  . CAD (coronary artery disease)   . Colon polyp    Cscope at [facility] aprox 2006, repeated 2010 (-), next 5 years  . Diabetes mellitus with neuropathy ([place])   . Diaphragm paralysis    R, per pt   . DJD (degenerative joint disease)   . Erectile dysfunction   . Eye muscle paralysis    congenital, lateral rectus, left  . Heart palpitations   . Hyperlipidemia   . Microscopic hematuria 2017   seeing urology  . Mild cognitive disorder    Unspecified mild NCD  . Skin cancer    sees derm    Past Surgical History:  Procedure Laterality Date  . ANKLE FUSION Right   . APPENDECTOMY    . CARPAL TUNNEL RELEASE     B, 2000  . CYSTOSCOPY  2017  . HIP SURGERY Right 1964   pins  . HIP SURGERY  2009   RIGHT--s/p hip replacement, s/p revision August 2009 due to dislocation x 3   . HIP SURGERY  04-2012, 2017   R hip revision  . KNEE SURGERY  Left 2005  . MEDIAL PARTIAL KNEE REPLACEMENT Right 08-2012  . TOTAL HIP ARTHROPLASTY  08-09-15   [facility]    Social History   Social History  . Marital status: Married    Spouse name: N/A  . Number of children: N/A  . 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 Smoking status: Former Smoker    Quit date: 01/02/1981  . Smokeless tobacco: Never Used     Comment: quit 1980s  . Alcohol use No  . Drug use: No  . Sexual activity: Not on file   Other Topics Concern  . Not on file   Social History Narrative  . No narrative on file      Allergies as of 06/22/2016      Reactions   Pseudoephedrine Other (See Comments)   Dx: A. Fib.       Medication List       Accurate as of 06/22/16 11:59 PM. Always use your most recent med list.          acetaminophen 500 MG tablet Commonly known as:  TYLENOL Take 1,000 mg by mouth every 6 (six) hours as needed for moderate pain.   buPROPion 75 MG tablet Commonly known  as:  WELLBUTRIN Take 1 tablet (75 mg total) by mouth 2 (two) times daily.   Cinnamon 500 MG capsule Take 500 mg by mouth 2 (two) times daily.   diltiazem 120 MG 24 hr capsule Commonly known as:  DILACOR XR Take 120 mg by mouth daily.   ELIQUIS 5 MG Tabs tablet Generic drug:  apixaban Take 5 mg by mouth 2 (two) times daily.   FLUoxetine 20 MG tablet Commonly known as:  PROZAC Take 2 tablets (40 mg total) by mouth daily.   furosemide 20 MG tablet Commonly known as:  LASIX Take 1 tablet (20 mg total) by mouth every other day.   insulin glargine 100 UNIT/ML injection Commonly known as:  LANTUS Inject 5 Units into the skin at bedtime.   Insulin Pen Needle 31G X 5 MM Misc Inject 15 Units into the skin daily at 10 pm.   loratadine 10 MG tablet Commonly known as:  CLARITIN Take 10 mg by mouth every morning.   losartan 100 MG tablet Commonly known as:  COZAAR Take 1 tablet (100 mg total) by mouth  daily.   metFORMIN 1000 MG tablet Commonly known as:  GLUCOPHAGE Take 1 tablet (1,000 mg total) by mouth 2 (two) times daily with a meal.   multivitamin tablet Take 1 tablet by mouth every morning.   naproxen sodium 220 MG tablet Commonly known as:  ANAPROX Take 220 mg by mouth daily as needed (pain).   omega-3 acid ethyl esters 1 g capsule Commonly known as:  LOVAZA Take 2 g by mouth 2 (two) times daily.   omeprazole 20 MG capsule Commonly known as:  PRILOSEC Take 1 capsule (20 mg total) by mouth daily.   polyvinyl alcohol 1.4 % ophthalmic solution Commonly known as:  LIQUIFILM TEARS Place 1 drop into both eyes 3 (three) times daily as needed for dry eyes.   simvastatin 40 MG tablet Commonly known as:  ZOCOR Take 40 mg by mouth daily.   vitamin B-12 250 MCG tablet Commonly known as:  CYANOCOBALAMIN Take 250 mcg by mouth daily.          Objective:   Physical Exam BP 128/66 (BP Location: Left Arm, Patient Position: Standing, Cuff Size: Normal)   Pulse 61   Temp 98.2 F (36.8 C) (Oral)   Resp 14   Ht 6\' 6"  (1.981 m)   Wt 231 lb 8 oz (105 kg)   SpO2 97%   BMI 26.75 kg/m  General:   Well developed, well nourished . NAD.  Neck: No bruit, normal carotid pulses HEENT:  Normocephalic . Face symmetric, atraumatic Lungs:  CTA B Normal respiratory effort, no intercostal retractions, no accessory muscle use. Heart: RRR,  no murmur.  No pretibial edema bilaterally  Skin: Not pale. Not jaundice Neurologic:  alert, active, follow all, needs.  Speech normal, gait assisted by a cane, he does seems to be weak and having trouble getting up and transferring Psych--  Cooperative with normal attention span and concentration.  No anxious or depressed appearing.      Assessment & Plan:  Assessment -- seen regularly at [name], most chronic med issues f/u there DM with neuropathy  -- [facility] Hyper[facility] Anxiety Gait disorder, multifactorial  CV: --CAD --A. Fib,  new onset 03/27/2015, Dr Burt Knack DJD Skin cancer, sees dermatology Right diaphragmatic paralysis per patient Vascular dementia   MMSE decreased from 27 to 20 on March 2016, formal psychologically eval 12-2015. Rx not driving, family to provide medications. Intolerant  to Aricept and Namenda  H/o eye muscle paralysis congenital  PLAN:  Symptomatic bradycardia: s/p admission, w/u neg, due to taking inadvertedly a extra cardiazem? he is back on diltiazem. Heart rate back to normal. BP check when he was a standing was 120/60 with a pulse of 60 once. Not orthostatic by BP. No change Weight loss, fatigue and now also dizziness: Weight loss fatigue, fatigye and dizziness are his main concerns today. See last visit, workup was negative. I review his medications, Aricept could be contributing to weight loss and dizziness, will hold for now. We'll see neurology soon. For completeness we'll check a carotid ultrasound , recheck a CBC, iron and ferritin. (Anemia noted at the hospital); recent brain imaging negative.  offered physical therapy for strengthening but declining, states he goes to the gym at the hospital and get plenty of physical activity. Dementia: Wife reports that she is getting even more forgetful lately, will see neurology in few weeks. RTC 6 weeks .

## 2016-06-22 NOTE — Patient Instructions (Addendum)
GO TO THE LAB : Get the blood work     GO TO THE FRONT DESK Schedule your next appointment for a  checkup in 6 weeks    Stop Aricept (donepezil)

## 2016-06-23 LAB — FERRITIN: Ferritin: 79 ng/mL (ref 20–380)

## 2016-06-24 NOTE — Assessment & Plan Note (Signed)
Symptomatic bradycardia: s/p admission, w/u neg, due to taking inadvertedly a extra cardiazem? he is back on diltiazem. Heart rate back to normal. BP check when he was a standing was 120/60 with a pulse of 60 once. Not orthostatic by BP. No change Weight loss, fatigue and now also dizziness: Weight loss fatigue, fatigye and dizziness are his main concerns today. See last visit, workup was negative. I review his medications, Aricept could be contributing to weight loss and dizziness, will hold for now. We'll see neurology soon. For completeness we'll check a carotid ultrasound , recheck a CBC, iron and ferritin. (Anemia noted at the hospital); recent brain imaging negative.  offered physical therapy for strengthening but declining, states he goes to the gym at the hospital and get plenty of physical activity. Dementia: Wife reports that she is getting even more forgetful lately, will see neurology in few weeks. RTC 6 weeks .

## 2016-07-03 ENCOUNTER — Ambulatory Visit: Payer: Medicare Other | Admitting: Neurology

## 2016-07-04 ENCOUNTER — Encounter: Payer: Self-pay | Admitting: Physician Assistant

## 2016-07-04 ENCOUNTER — Ambulatory Visit (INDEPENDENT_AMBULATORY_CARE_PROVIDER_SITE_OTHER): Payer: Medicare Other | Admitting: Physician Assistant

## 2016-07-04 VITALS — BP 122/58 | HR 64 | Ht 78.0 in | Wt 231.4 lb

## 2016-07-04 DIAGNOSIS — R0681 Apnea, not elsewhere classified: Secondary | ICD-10-CM

## 2016-07-04 DIAGNOSIS — I480 Paroxysmal atrial fibrillation: Secondary | ICD-10-CM

## 2016-07-04 DIAGNOSIS — R001 Bradycardia, unspecified: Secondary | ICD-10-CM

## 2016-07-04 DIAGNOSIS — I10 Essential (primary) hypertension: Secondary | ICD-10-CM | POA: Diagnosis not present

## 2016-07-04 DIAGNOSIS — R0602 Shortness of breath: Secondary | ICD-10-CM | POA: Diagnosis not present

## 2016-07-04 NOTE — Patient Instructions (Addendum)
Medication Instructions:  1. STOP ARICEPT   Labwork: NONE ORDERED  Testing/Procedures: OVERNIGHT OXIMETRY ; TO RULE OUT SLEEP APNEA. NINA JONES, CMA FROM OUR OFFICE WILL CALL YOU WITH AN APPT.  Follow-Up: KEEP YOUR UPCOMING APPT WITH DR. Burt Knack 07/27/16  Any Other Special Instructions Will Be Listed Below (If Applicable).  YOU HAVE BEEN SCHEDULED FOR YOUR CAROTID DOPPLERS TO BE DONE TOMORROW AT [facility] @ 11:30 AM; IF YOU CANNOT KEEP THIS APPT PLEASE CALL [phone]  If you need a refill on your cardiac medications before your next appointment, please call your pharmacy.

## 2016-07-04 NOTE — Progress Notes (Signed)
Cardiology Office Note:    Date:  07/04/2016   ID:  Ivan Anderson, DOB 09-24-1940, MRN [id number]  PCP:  [name], MD  Cardiologist:  Dr. Sherren Mocha   Electrophysiologist:  n/a  Referring MD: Colon Branch, MD   Chief Complaint  Patient presents with  . Hospitalization Follow-up    bradycardia    History of Present Illness:    Ivan Anderson is a 76 y.o. male with a hx of Paroxysmal AF, diabetes, HL.  Last seen by Truitt Merle, NP in 9/17.    He was admitted 3/9-3/10 with symptomatic bradycardia with symptoms of weakness and dizziness and a heart rate of 40. There was a question of whether or not he took too much diltiazem the day before. His diltiazem was discontinued and he was evaluated by Dr. Debara Pickett for cardiology. Repeat echocardiogram demonstrated normal LV function. On review of telemetry that is available in his chart, his heart rate remained 40s-50s. There were no significant pauses. He saw primary care on 06/22/16. Notes indicate he was back on Cardizem. His heart rate was in the 60s. Aricept was stopped and he was asked to follow-up with neurology.  He is here for Cardiology follow up.  He is here with his wife.  I reviewed all of his medications.  He is still taking Aricept.  He continues to feel dizzy at times, especially with standing.  His orthostatic VS were abnormal in the hospital (BP dropped 160 >> 130 lying to standing).  He had orthostatics at Dr. Ethel Rana office and these were normal.  He has Carotid US pending.  He see Dr. Carles Collet in a few weeks for follow up.  He denies chest pain.  He complains of chronic shortness of breath.  This seems to be with most activities and he thinks it is getting worse.  He denies orthopnea, PND, edema.  He denies syncope or near syncope.  He denies vertiginous symptoms.  His wife notes witnessed apneic episodes.  He does not have much snoring.    Prior CV studies:   The following studies were reviewed today:  Echo 06/16/16 Moderate concentric  LVH, EF 60-65, normal wall motion, grade 1 diastolic dysfunction, aortic sclerosis without stenosis  Echo 04/08/15 Moderate LVH, vigorous LVF, EF 65-70, normal wall motion, grade 1 diastolic dysfunction, mild LAE, mild TR  Myoview 5/13 No ischemia  Past Medical History:  Diagnosis Date  . Anxiety   . Atrial fibrillation, new onset ([place]) 03/27/2015  . CAD (coronary artery disease)   . Colon polyp    Cscope at [facility] aprox 2006, repeated 2010 (-), next 5 years  . Diabetes mellitus with neuropathy ([place])   . Diaphragm paralysis    R, per pt   . DJD (degenerative joint disease)   . Erectile dysfunction   . Eye muscle paralysis    congenital, lateral rectus, left  . Heart palpitations   . Hyperlipidemia   . Microscopic hematuria 2017   seeing urology  . Mild cognitive disorder    Unspecified mild NCD  . Skin cancer    sees derm    Past Surgical History:  Procedure Laterality Date  . ANKLE FUSION Right   . APPENDECTOMY    . CARPAL TUNNEL RELEASE     B, 2000  . CYSTOSCOPY  2017  . HIP SURGERY Right 1964   pins  . HIP SURGERY  2009   RIGHT--s/p hip replacement, s/p revision August 2009 due to dislocation x 3   .  HIP SURGERY  04-2012, 2017   R hip revision  . KNEE SURGERY Left 2005  . MEDIAL PARTIAL KNEE REPLACEMENT Right 08-2012  . TOTAL HIP ARTHROPLASTY  08-09-15   [facility]    Current Medications: Current Meds  Medication Sig  . acetaminophen (TYLENOL) 500 MG tablet Take 1,000 mg by mouth every 6 (six) hours as needed for moderate pain.   Marland Kitchen apixaban (ELIQUIS) 5 MG TABS tablet Take 5 mg by mouth 2 (two) times daily.  Marland Kitchen buPROPion (WELLBUTRIN) 75 MG tablet Take 1 tablet (75 mg total) by mouth 2 (two) times daily.  . Cinnamon 500 MG capsule Take 500 mg by mouth 2 (two) times daily.   Marland Kitchen diltiazem (DILACOR XR) 120 MG 24 hr capsule Take 120 mg by mouth daily.  Marland Kitchen FLUoxetine (PROZAC) 20 MG tablet Take 2 tablets (40 mg total) by mouth daily.  . furosemide (LASIX) 20 MG  tablet Take 1 tablet (20 mg total) by mouth every other day.  . insulin glargine (LANTUS) 100 UNIT/ML injection Inject 5 Units into the skin at bedtime.  . Insulin Pen Needle 31G X 5 MM MISC Inject 15 Units into the skin daily at 10 pm.  . loratadine (CLARITIN) 10 MG tablet Take 10 mg by mouth every morning.   Marland Kitchen losartan (COZAAR) 100 MG tablet Take 1 tablet (100 mg total) by mouth daily.  . metFORMIN (GLUCOPHAGE) 1000 MG tablet Take 1 tablet (1,000 mg total) by mouth 2 (two) times daily with a meal.  . Multiple Vitamin (MULTIVITAMIN) tablet Take 1 tablet by mouth every morning.   . naproxen sodium (ANAPROX) 220 MG tablet Take 220 mg by mouth daily as needed (pain).  Marland Kitchen omega-3 acid ethyl esters (LOVAZA) 1 g capsule Take 2 g by mouth 2 (two) times daily.   Marland Kitchen omeprazole (PRILOSEC) 20 MG capsule Take 1 capsule (20 mg total) by mouth daily.  . polyvinyl alcohol (LIQUIFILM TEARS) 1.4 % ophthalmic solution Place 1 drop into both eyes 3 (three) times daily as needed for dry eyes.  . simvastatin (ZOCOR) 40 MG tablet Take 40 mg by mouth daily.   . tamsulosin (FLOMAX) 0.4 MG CAPS capsule Take 0.4 mg by mouth daily.  . vitamin B-12 (CYANOCOBALAMIN) 250 MCG tablet Take 250 mcg by mouth daily.  . [DISCONTINUED] donepezil (ARICEPT) 10 MG tablet      Allergies:   Pseudoephedrine   Social History   Social History  . Marital status: Married    Spouse name: N/A  . Number of children: N/A  . 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 Smoking status: Former Smoker    Quit date: 01/02/1981  . Smokeless tobacco: Never Used     Comment: quit 1980s  . Alcohol use No  . Drug use: No  . Sexual activity: Not Asked   Other Topics Concern  . None   Social History Narrative  . None     Family History  Problem Relation Age of Onset  . Dementia Father   . Emphysema Father   . Diabetes Maternal Grandmother   . Heart disease Sister   . Stroke Sister     . Heart attack Neg Hx   . Hypertension Neg Hx      ROS:   Please see the history of present illness.    ROS All other systems reviewed and are negative.   EKGs/Labs/Other Test Reviewed:    EKG:  EKG is  ordered today.  The ekg ordered today demonstrates NSR, HR 64, normal axis, 1st degree AVB, PR 212 ms, QTc 416 ms  Recent Labs: 06/16/2016: ALT 18; BUN 10; Creatinine, Ser 0.66; Magnesium 1.7; Potassium 3.5; Sodium 139; TSH 1.550 06/22/2016: Hemoglobin 13.6; Platelets 199   Recent Lipid Panel    Component Value Date/Time   CHOL 145 07/15/2014   TRIG 39 (A) 07/15/2014   HDL 60 07/15/2014   CHOLHDL 3 10/23/2013 0833   VLDL 4.4 10/23/2013 0833   LDLCALC 77 07/15/2014     Physical Exam:    VS:  BP (!) 122/58   Pulse 64   Ht 6\' 6"  (1.981 m)   Wt 231 lb 6.4 oz (105 kg)   BMI 26.74 kg/m     Wt Readings from Last 3 Encounters:  07/04/16 231 lb 6.4 oz (105 kg)  06/22/16 231 lb 8 oz (105 kg)  06/15/16 225 lb (102.1 kg)     Physical Exam  Constitutional: He is oriented to person, place, and time. He appears well-developed and well-nourished. No distress.  HENT:  Head: Normocephalic and atraumatic.  Eyes: No scleral icterus.  Neck: Normal range of motion. No JVD present. Carotid bruit is not present.  Cardiovascular: Normal rate, regular rhythm, S1 normal and S2 normal.   No murmur heard. Pulmonary/Chest: Effort normal and breath sounds normal. He has no wheezes. He has no rhonchi. He has no rales.  Abdominal: Soft. There is no tenderness.  Musculoskeletal: He exhibits no edema.  Neurological: He is alert and oriented to person, place, and time.  Skin: Skin is warm and dry.  Psychiatric: He has a normal mood and affect.    ASSESSMENT:    1. Bradycardia   2. Paroxysmal atrial fibrillation (HCC)   3. Essential hypertension   4. Apnea   5. Shortness of breath    PLAN:    In order of problems listed above:  1. Bradycardia - He likely doubled the amount of  Diltiazem prior to going to the hospital.  He had a Rx from the [facility] and one from [place].  It sounds like he was taking both.  He is now just taking one Rx.  He is still taking Aricept which may be causing some bradycardia and contributing to his symptoms.  I think it is worth stopping his Aricept first to see if he has significant improvement with this.  If not, we may need to pursue monitoring or stopping his AVN blocking agent.  If we have to DC his Diltiazem, he may have more issues with AFib.    -  DC Aricept  -  Keep FU with Dr. Burt Knack next month to FU on the above.   2. Paroxysmal atrial fibrillation (HCC) -  Maintaining NSR.  Continue Apixaban.  He is on the appropriate dose at 5 mg bid.  Continue Diltiazem for now.  FU next month.  If symptoms continue off of Aricept, consider event monitor +/- DCing Diltiazem.    3. Essential hypertension - BP controlled.  4. Apnea -  His wife has witnessed apnea at night.  He does not have significant snoring.  OSA can contribute to bradycardia.   -  Arrange overnight oximetry to assess for OSA  5. Shortness of breath - He notes chronic dyspnea on exertion that has gotten worse.  He denies chest pain.  His ECG does not show any ischemic changes.  However, he is a diabetic.  Question if shortness of  breath is an anginal equivalent.  His appetite has been poor.  It is possible that the Aricept is contributing to his symptoms.  If his shortness of breath does not improve with stopping his Aricept, will need to consider stress testing.    Dispo:  Return in about 3 weeks (around 07/27/2016) for Scheduled Follow Up, w/ Dr. Burt Knack.   Medication Adjustments/Labs and Tests Ordered: Current medicines are reviewed at length with the patient today.  Concerns regarding medicines are outlined above.  Medication changes, Labs and Tests ordered today are outlined in the Patient Instructions noted below. Patient Instructions  Medication Instructions:  1. STOP ARICEPT    Labwork: NONE ORDERED  Testing/Procedures: OVERNIGHT OXIMETRY ; TO RULE OUT SLEEP APNEA. NINA JONES, CMA FROM OUR OFFICE WILL CALL YOU WITH AN APPT.  Follow-Up: KEEP YOUR UPCOMING APPT WITH DR. Burt Knack 07/27/16  Any Other Special Instructions Will Be Listed Below (If Applicable).  YOU HAVE BEEN SCHEDULED FOR YOUR CAROTID DOPPLERS TO BE DONE TOMORROW AT [facility] @ 11:30 AM; IF YOU CANNOT KEEP THIS APPT PLEASE CALL [phone]  If you need a refill on your cardiac medications before your next appointment, please call your pharmacy.  Signed, Richardson Dopp, PA-C  07/04/2016 5:00 PM    [address], [place], [place]  [address] Phone: [phone]; Fax: [phone]

## 2016-07-05 ENCOUNTER — Ambulatory Visit (HOSPITAL_BASED_OUTPATIENT_CLINIC_OR_DEPARTMENT_OTHER)
Admission: RE | Admit: 2016-07-05 | Discharge: 2016-07-05 | Disposition: A | Discharge status: Home / Self Care | Payer: Medicare Other | Source: Ambulatory Visit | Attending: Internal Medicine | Admitting: Internal Medicine

## 2016-07-05 DIAGNOSIS — R42 Dizziness and giddiness: Secondary | ICD-10-CM | POA: Insufficient documentation

## 2016-07-05 DIAGNOSIS — I6523 Occlusion and stenosis of bilateral carotid arteries: Secondary | ICD-10-CM | POA: Insufficient documentation

## 2016-07-10 ENCOUNTER — Telehealth: Payer: Self-pay | Admitting: Neurology

## 2016-07-10 ENCOUNTER — Emergency Department (HOSPITAL_BASED_OUTPATIENT_CLINIC_OR_DEPARTMENT_OTHER): Payer: Medicare Other

## 2016-07-10 ENCOUNTER — Encounter (HOSPITAL_BASED_OUTPATIENT_CLINIC_OR_DEPARTMENT_OTHER): Payer: Self-pay | Admitting: *Deleted

## 2016-07-10 ENCOUNTER — Emergency Department (HOSPITAL_BASED_OUTPATIENT_CLINIC_OR_DEPARTMENT_OTHER)
Admission: EM | Admit: 2016-07-10 | Discharge: 2016-07-10 | Disposition: A | Discharge status: Home / Self Care | Payer: Medicare Other | Attending: Emergency Medicine | Admitting: Emergency Medicine

## 2016-07-10 DIAGNOSIS — I2510 Atherosclerotic heart disease of native coronary artery without angina pectoris: Secondary | ICD-10-CM | POA: Insufficient documentation

## 2016-07-10 DIAGNOSIS — K5900 Constipation, unspecified: Secondary | ICD-10-CM

## 2016-07-10 DIAGNOSIS — Z794 Long term (current) use of insulin: Secondary | ICD-10-CM | POA: Insufficient documentation

## 2016-07-10 DIAGNOSIS — Z87891 Personal history of nicotine dependence: Secondary | ICD-10-CM | POA: Diagnosis not present

## 2016-07-10 DIAGNOSIS — R339 Retention of urine, unspecified: Secondary | ICD-10-CM | POA: Diagnosis not present

## 2016-07-10 DIAGNOSIS — I10 Essential (primary) hypertension: Secondary | ICD-10-CM | POA: Insufficient documentation

## 2016-07-10 DIAGNOSIS — E1140 Type 2 diabetes mellitus with diabetic neuropathy, unspecified: Secondary | ICD-10-CM | POA: Insufficient documentation

## 2016-07-10 DIAGNOSIS — K6289 Other specified diseases of anus and rectum: Secondary | ICD-10-CM | POA: Diagnosis present

## 2016-07-10 HISTORY — DX: Unspecified hemorrhoids: K64.9

## 2016-07-10 LAB — BASIC METABOLIC PANEL
ANION GAP: 9 (ref 5–15)
BUN: 15 mg/dL (ref 6–20)
CHLORIDE: 101 mmol/L (ref 101–111)
CO2: 27 mmol/L (ref 22–32)
Calcium: 9.4 mg/dL (ref 8.9–10.3)
Creatinine, Ser: 0.72 mg/dL (ref 0.61–1.24)
GFR calc Af Amer: >60 mL/min (ref >60)
GLUCOSE: 153 mg/dL — AB (ref 65–99)
POTASSIUM: 4.4 mmol/L (ref 3.5–5.1)
Sodium: 137 mmol/L (ref 135–145)

## 2016-07-10 LAB — URINALYSIS, ROUTINE W REFLEX MICROSCOPIC
Bilirubin Urine: NEGATIVE
GLUCOSE, UA: 100 mg/dL — AB
HGB URINE DIPSTICK: NEGATIVE
Ketones, ur: NEGATIVE mg/dL
LEUKOCYTES UA: NEGATIVE
Nitrite: NEGATIVE
PROTEIN: NEGATIVE mg/dL
SPECIFIC GRAVITY, URINE: 1.012 (ref 1.005–1.030)
pH: 7 (ref 5.0–8.0)

## 2016-07-10 LAB — CBC
HEMATOCRIT: 40.6 % (ref 39.0–52.0)
HEMOGLOBIN: 14.4 g/dL (ref 13.0–17.0)
MCH: 32.3 pg (ref 26.0–34.0)
MCHC: 35.5 g/dL (ref 30.0–36.0)
MCV: 91 fL (ref 78.0–100.0)
Platelets: 209 10*3/uL (ref 150–400)
RBC: 4.46 MIL/uL (ref 4.22–5.81)
RDW: 12.8 % (ref 11.5–15.5)
WBC: 7.1 10*3/uL (ref 4.0–10.5)

## 2016-07-10 MED ORDER — POLYETHYLENE GLYCOL 3350 17 G PO PACK: 17.0000 g | PACK | Freq: Every day | ORAL | 0 refills | Status: DC

## 2016-07-10 MED FILL — POLYETHYLENE GLYCOL 3350: 15 days supply | Qty: 255 | Fill #0

## 2016-07-10 NOTE — Telephone Encounter (Signed)
PT's daughter called and wants to have Dr Tat tell him that his license needs to be taken away and does not want him to know it is coming form her or her mom/Dawn

## 2016-07-10 NOTE — Discharge Instructions (Signed)
Follow-up with [facility] to have your foley re-assessed. Please follow up with Dr. Larose Kells for your weight loss. Return to the Emergency Department with any new or worsening symptoms.

## 2016-07-10 NOTE — ED Triage Notes (Signed)
Pt is here for hemorrhoids which he has had for 2 days.  Pt has had hemorrhoids before but "never this bad".  Pt states that he has been a bit constipated and has had a change in his medications.  Pt has self treated with preparation H.  Pt states that this helped the first day but then had large amount of hard stool which made it worse.

## 2016-07-10 NOTE — Telephone Encounter (Signed)
Daughter made aware. She is on DPR.

## 2016-07-10 NOTE — Telephone Encounter (Signed)
I have talked with him about driving previously as has Higher education careers adviser.  Pts daughter can turn him into [facility] which will generate a form to me and he will not know who it came from.  Make sure daughter on [facility]

## 2016-07-10 NOTE — Telephone Encounter (Signed)
Has not been seen since September but has a follow up scheduled on 07/31/16.   It was recommended by Dr. Si Raider for patient to not drive. Please advise.

## 2016-07-10 NOTE — ED Notes (Signed)
Leg bag applied; education provided to pt and wife.

## 2016-07-10 NOTE — ED Provider Notes (Signed)
[place] DEPT [facility] Provider Note   CSN: [id number] Arrival date & time: 07/10/16  1334     History   Chief Complaint Chief Complaint  Patient presents with  . Hemorrhoids    HPI Ivan Anderson is a 76 y.o. male who presents to the Emergency Department for a chief complaint of worsening rectal pain that began 2 days ago. He reports a history of painful hemorrhoids that he has treated the pain with Preparation H at home, which improved the pain yesterday, but was ineffective today. He also has been unable to void since this morning. He has never had this problem before.  He reports he has been constipated over the last week, but had two BMs two days ago. No BM today. He states that he used toilet tissue to help extract more stool from his rectum because he felt as if he had more stool that hadn't come out. He also reports that he has lost 20 lbs since the beginning of 2018 and 25 lbs in 2017 without trying. His wife reports that he doesn't have an appetite for most anymore, and she has stopped cooking at home. He denies a h/o prostate problems. He has never been established with urology.   PMH includes DM Type II, dementia, and Afib. He is a former smoker (quit in 1982). He is allergic to pseudoephedrine.   HPI  Past Medical History:  Diagnosis Date  . Anxiety   . Atrial fibrillation, new onset ([place]) 03/27/2015  . CAD (coronary artery disease)   . Colon polyp    Cscope at [facility] aprox 2006, repeated 2010 (-), next 5 years  . Diabetes mellitus with neuropathy ([place])   . Diaphragm paralysis    R, per pt   . DJD (degenerative joint disease)   . Erectile dysfunction   . Eye muscle paralysis    congenital, lateral rectus, left  . Heart palpitations   . Hemorrhoids   . Hyperlipidemia   . Microscopic hematuria 2017   seeing urology  . Mild cognitive disorder    Unspecified mild NCD  . Skin cancer    sees derm    Patient Active Problem List   Diagnosis Date Noted  . Dementia  06/15/2016  . Bradycardia 06/15/2016  . PCP NOTES >>>>>> 03/29/2015  . Paroxysmal atrial fibrillation ([place]) 03/27/2015  . 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 TRIGGER FINGER, LEFT MIDDLE 06/08/2010  . HAND PAIN, LEFT 06/08/2010  . ANXIETY 12/19/2006  . 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 Dyslipidemia 10/01/2006  . Diabetes ([place]) 05/04/2006  . ERECTILE DYSFUNCTION 05/04/2006  . Essential hypertension 05/04/2006  . Coronary atherosclerosis 05/04/2006  . OSTEOARTHRITIS 05/04/2006  . COLONIC POLYPS, HX OF 05/04/2006    Past Surgical History:  Procedure Laterality Date  . ANKLE FUSION Right   . APPENDECTOMY    . CARPAL TUNNEL RELEASE     B, 2000  . CYSTOSCOPY  2017  . HIP SURGERY Right 1964   pins  . HIP SURGERY  2009   RIGHT--s/p hip replacement, s/p revision August 2009 due to dislocation x 3   . HIP SURGERY  04-2012, 2017   R hip revision  . KNEE SURGERY Left 2005  . MEDIAL PARTIAL KNEE REPLACEMENT Right 08-2012  . TOTAL HIP ARTHROPLASTY  08-09-15   [facility]       Home Medications    Prior to Admission medications   Medication Sig Start Date End Date  Taking? Authorizing Provider  acetaminophen (TYLENOL) 500 MG tablet Take 1,000 mg by mouth every 6 (six) hours as needed for moderate pain.     Historical Provider, MD  apixaban (ELIQUIS) 5 MG TABS tablet Take 5 mg by mouth 2 (two) times daily.    Historical Provider, MD  buPROPion (WELLBUTRIN) 75 MG tablet Take 1 tablet (75 mg total) by mouth 2 (two) times daily. 05/25/16   Colon Branch, MD  Cinnamon 500 MG capsule Take 500 mg by mouth 2 (two) times daily.     Historical Provider, MD  diltiazem (DILACOR XR) 120 MG 24 hr capsule Take 120 mg by mouth daily.    Historical Provider, MD  FLUoxetine (PROZAC) 20 MG tablet Take 2 tablets (40 mg total) by mouth daily. 07/01/14   Colon Branch, MD  furosemide (LASIX) 20 MG tablet Take 1 tablet (20 mg total) by mouth every  other day. 06/16/16   Doreatha Lew, MD  insulin glargine (LANTUS) 100 UNIT/ML injection Inject 5 Units into the skin at bedtime.    Historical Provider, MD  Insulin Pen Needle 31G X 5 MM MISC Inject 15 Units into the skin daily at 10 pm. 01/16/16   Colon Branch, MD  loratadine (CLARITIN) 10 MG tablet Take 10 mg by mouth every morning.  12/02/14   Colon Branch, MD  losartan (COZAAR) 100 MG tablet Take 1 tablet (100 mg total) by mouth daily. 12/02/14   Colon Branch, MD  metFORMIN (GLUCOPHAGE) 1000 MG tablet Take 1 tablet (1,000 mg total) by mouth 2 (two) times daily with a meal. 12/02/14   Colon Branch, MD  Multiple Vitamin (MULTIVITAMIN) tablet Take 1 tablet by mouth every morning.  08/27/12   Historical Provider, MD  naproxen sodium (ANAPROX) 220 MG tablet Take 220 mg by mouth daily as needed (pain).    Historical Provider, MD  omega-3 acid ethyl esters (LOVAZA) 1 g capsule Take 2 g by mouth 2 (two) times daily.     Historical Provider, MD  omeprazole (PRILOSEC) 20 MG capsule Take 1 capsule (20 mg total) by mouth daily. 12/02/14   Colon Branch, MD  polyethylene glycol [facility]) packet Take 17 g by mouth daily. 07/10/16   Elic Vencill A Hendricks Schwandt, PA-C  polyvinyl alcohol (LIQUIFILM TEARS) 1.4 % ophthalmic solution Place 1 drop into both eyes 3 (three) times daily as needed for dry eyes.    Historical Provider, MD  simvastatin (ZOCOR) 40 MG tablet Take 40 mg by mouth daily.     Historical Provider, MD  tamsulosin (FLOMAX) 0.4 MG CAPS capsule Take 0.4 mg by mouth daily.    Historical Provider, MD  vitamin B-12 (CYANOCOBALAMIN) 250 MCG tablet Take 250 mcg by mouth daily.    Historical Provider, MD    Family History Family History  Problem Relation Age of Onset  . Dementia Father   . Emphysema Father   . Diabetes Maternal Grandmother   . Heart disease Sister   . Stroke Sister   . Heart attack Neg Hx   . Hypertension Neg Hx     Social History Social History  Substance Use Topics  . Smoking status: Former Smoker      Quit date: 01/02/1981  . Smokeless tobacco: Never Used     Comment: quit 1980s  . Alcohol use No     Allergies   Pseudoephedrine   Review of Systems Review of Systems  Constitutional: Positive for appetite change (chronic). Negative for chills and  fever.  Gastrointestinal: Positive for abdominal pain, constipation and rectal pain. Negative for blood in stool, diarrhea, nausea and vomiting.  Genitourinary: Positive for difficulty urinating.  Skin: Negative for rash.  Allergic/Immunologic: Positive for immunocompromised state.  All other systems reviewed and are negative.  Physical Exam Updated Vital Signs BP 138/74 (BP Location: Right Arm)   Pulse 60   Temp 97.9 F (36.6 C) (Oral)   Resp 16   Ht 6\' 6"  (1.981 m)   Wt 99.8 kg   SpO2 100%   BMI 25.42 kg/m   Physical Exam  Constitutional: He appears well-developed and well-nourished.  HENT:  Head: Normocephalic and atraumatic.  Eyes: Conjunctivae are normal.  Neck: Neck supple.  Cardiovascular: Normal rate.  An irregularly irregular rhythm present.  No murmur heard. Pulmonary/Chest: Effort normal and breath sounds normal. No respiratory distress.  Abdominal: Soft. There is tenderness in the suprapubic area. There is guarding. There is no rebound.  Genitourinary: Rectal exam shows tenderness. Rectal exam shows no external hemorrhoid and anal tone normal.  Genitourinary Comments: No gross blood observed. There are some erythematous, eczematous changes to the perirectal skin, possibly due to straining during recent BMs. No warmth or swelling.   Musculoskeletal: He exhibits no edema.  Neurological: He is alert.  Skin: Skin is warm and dry.  Psychiatric: He has a normal mood and affect.  Nursing note and vitals reviewed.  ED Treatments / Results  Labs (all labs ordered are listed, but only abnormal results are displayed) Labs Reviewed  BASIC METABOLIC PANEL - Abnormal; Notable for the following:       Result Value    Glucose, Bld 153 (*)    All other components within normal limits  URINALYSIS, ROUTINE W REFLEX MICROSCOPIC - Abnormal; Notable for the following:    Glucose, UA 100 (*)    All other components within normal limits  CBC    EKG  EKG Interpretation None       Radiology No results found.  Procedures Procedures (including critical care time)  Medications Ordered in ED Medications - No data to display   Initial Impression / Assessment and Plan / ED Course  I have reviewed the triage vital signs and the nursing notes.  Pertinent labs & imaging results that were available during my care of the patient were reviewed by me and considered in my medical decision making (see chart for details).     Patient with urinary retention since this AM. No prior history. Bladder scan showed a max of 101 mL. A foley was inserted and showed 700 mL while continuing to drain. The patient reported much relief as the bag continued to drain. He also reports rectal pain. No external hemorrhoids noted on rectal exam. No gross blood noted in brown stool. The patient is passing flatus. Reports last BM was 2 days ago. VSS. Discussed and evaluated the patient with Dr. Venora Maples, attending physician. Will discharge the patient to home with a foley catheter and leg bag. Will give the patient a referral to Urology and GI. The patient's wife mentioned a 45 lb unintentional weight loss. Chart review reveals the patient's PCP, Dr. Larose Kells, has initiated a work up for unexplained weight loss. Encouraged the patient to follow up with his PCP for further work up.   Final Clinical Impressions(s) / ED Diagnoses   Final diagnoses:  Urinary retention  Constipation, unspecified constipation type    New Prescriptions Discharge Medication List as of 07/10/2016  4:22 PM  START taking these medications   Details  polyethylene glycol (MIRALAX) packet Take 17 g by mouth daily., Starting Tue 07/10/2016, Print         Akeylah Hendel A  Arly Salminen, PA-C 07/10/16 [place], MD 07/11/16 [phone]

## 2016-07-10 NOTE — ED Notes (Signed)
Bladder scan showed a max of 167mL. After foley insertion bag shows 715mL and still draining. RN and MD notified

## 2016-07-12 ENCOUNTER — Encounter: Payer: Self-pay | Admitting: Cardiovascular Disease

## 2016-07-13 DIAGNOSIS — R3914 Feeling of incomplete bladder emptying: Secondary | ICD-10-CM | POA: Diagnosis not present

## 2016-07-13 DIAGNOSIS — R3912 Poor urinary stream: Secondary | ICD-10-CM | POA: Diagnosis not present

## 2016-07-16 ENCOUNTER — Emergency Department (HOSPITAL_COMMUNITY): Payer: Medicare Other

## 2016-07-16 ENCOUNTER — Encounter (HOSPITAL_COMMUNITY): Payer: Self-pay | Admitting: Family Medicine

## 2016-07-16 ENCOUNTER — Observation Stay (HOSPITAL_COMMUNITY)
Admission: EM | Admit: 2016-07-16 | Discharge: 2016-07-17 | Disposition: A | Discharge status: Home / Self Care | Payer: Medicare Other | Attending: Family Medicine | Admitting: Family Medicine

## 2016-07-16 DIAGNOSIS — Z96643 Presence of artificial hip joint, bilateral: Secondary | ICD-10-CM | POA: Insufficient documentation

## 2016-07-16 DIAGNOSIS — F0390 Unspecified dementia without behavioral disturbance: Secondary | ICD-10-CM | POA: Insufficient documentation

## 2016-07-16 DIAGNOSIS — B9629 Other Escherichia coli [E. coli] as the cause of diseases classified elsewhere: Secondary | ICD-10-CM | POA: Diagnosis not present

## 2016-07-16 DIAGNOSIS — A419 Sepsis, unspecified organism: Principal | ICD-10-CM | POA: Insufficient documentation

## 2016-07-16 DIAGNOSIS — Z79899 Other long term (current) drug therapy: Secondary | ICD-10-CM | POA: Diagnosis not present

## 2016-07-16 DIAGNOSIS — N400 Enlarged prostate without lower urinary tract symptoms: Secondary | ICD-10-CM | POA: Diagnosis not present

## 2016-07-16 DIAGNOSIS — R509 Fever, unspecified: Secondary | ICD-10-CM | POA: Diagnosis not present

## 2016-07-16 DIAGNOSIS — R410 Disorientation, unspecified: Secondary | ICD-10-CM | POA: Diagnosis not present

## 2016-07-16 DIAGNOSIS — G9341 Metabolic encephalopathy: Secondary | ICD-10-CM | POA: Diagnosis not present

## 2016-07-16 DIAGNOSIS — I110 Hypertensive heart disease with heart failure: Secondary | ICD-10-CM | POA: Insufficient documentation

## 2016-07-16 DIAGNOSIS — E785 Hyperlipidemia, unspecified: Secondary | ICD-10-CM | POA: Diagnosis not present

## 2016-07-16 DIAGNOSIS — Z87891 Personal history of nicotine dependence: Secondary | ICD-10-CM | POA: Insufficient documentation

## 2016-07-16 DIAGNOSIS — K219 Gastro-esophageal reflux disease without esophagitis: Secondary | ICD-10-CM | POA: Diagnosis not present

## 2016-07-16 DIAGNOSIS — G9340 Encephalopathy, unspecified: Secondary | ICD-10-CM | POA: Diagnosis present

## 2016-07-16 DIAGNOSIS — B9620 Unspecified Escherichia coli [E. coli] as the cause of diseases classified elsewhere: Secondary | ICD-10-CM | POA: Insufficient documentation

## 2016-07-16 DIAGNOSIS — I5032 Chronic diastolic (congestive) heart failure: Secondary | ICD-10-CM | POA: Insufficient documentation

## 2016-07-16 DIAGNOSIS — Z7901 Long term (current) use of anticoagulants: Secondary | ICD-10-CM | POA: Insufficient documentation

## 2016-07-16 DIAGNOSIS — Z794 Long term (current) use of insulin: Secondary | ICD-10-CM | POA: Insufficient documentation

## 2016-07-16 DIAGNOSIS — R531 Weakness: Secondary | ICD-10-CM

## 2016-07-16 DIAGNOSIS — I2510 Atherosclerotic heart disease of native coronary artery without angina pectoris: Secondary | ICD-10-CM | POA: Insufficient documentation

## 2016-07-16 DIAGNOSIS — E1140 Type 2 diabetes mellitus with diabetic neuropathy, unspecified: Secondary | ICD-10-CM | POA: Insufficient documentation

## 2016-07-16 DIAGNOSIS — N390 Urinary tract infection, site not specified: Secondary | ICD-10-CM | POA: Diagnosis not present

## 2016-07-16 DIAGNOSIS — F329 Major depressive disorder, single episode, unspecified: Secondary | ICD-10-CM | POA: Diagnosis not present

## 2016-07-16 DIAGNOSIS — I440 Atrioventricular block, first degree: Secondary | ICD-10-CM | POA: Insufficient documentation

## 2016-07-16 DIAGNOSIS — E119 Type 2 diabetes mellitus without complications: Secondary | ICD-10-CM

## 2016-07-16 DIAGNOSIS — R404 Transient alteration of awareness: Secondary | ICD-10-CM | POA: Diagnosis not present

## 2016-07-16 DIAGNOSIS — I10 Essential (primary) hypertension: Secondary | ICD-10-CM | POA: Diagnosis present

## 2016-07-16 DIAGNOSIS — I480 Paroxysmal atrial fibrillation: Secondary | ICD-10-CM | POA: Insufficient documentation

## 2016-07-16 DIAGNOSIS — R319 Hematuria, unspecified: Secondary | ICD-10-CM

## 2016-07-16 DIAGNOSIS — F32A Depression, unspecified: Secondary | ICD-10-CM | POA: Diagnosis present

## 2016-07-16 LAB — COMPREHENSIVE METABOLIC PANEL
ALK PHOS: 92 U/L (ref 38–126)
ALT: 15 U/L — ABNORMAL LOW (ref 17–63)
ANION GAP: 7 (ref 5–15)
AST: 14 U/L — ABNORMAL LOW (ref 15–41)
Albumin: 3.9 g/dL (ref 3.5–5.0)
BILIRUBIN TOTAL: 0.6 mg/dL (ref 0.3–1.2)
BUN: 15 mg/dL (ref 6–20)
CALCIUM: 9 mg/dL (ref 8.9–10.3)
CO2: 25 mmol/L (ref 22–32)
Chloride: 103 mmol/L (ref 101–111)
Creatinine, Ser: 0.74 mg/dL (ref 0.61–1.24)
GFR calc Af Amer: >60 mL/min (ref >60)
Glucose, Bld: 198 mg/dL — ABNORMAL HIGH (ref 65–99)
POTASSIUM: 3.9 mmol/L (ref 3.5–5.1)
Sodium: 135 mmol/L (ref 135–145)
TOTAL PROTEIN: 6.8 g/dL (ref 6.5–8.1)

## 2016-07-16 LAB — URINALYSIS, ROUTINE W REFLEX MICROSCOPIC
BILIRUBIN URINE: NEGATIVE
Glucose, UA: 50 mg/dL — AB
KETONES UR: 5 mg/dL — AB
Nitrite: NEGATIVE
PH: 5 (ref 5.0–8.0)
Protein, ur: 30 mg/dL — AB
SPECIFIC GRAVITY, URINE: 1.017 (ref 1.005–1.030)

## 2016-07-16 LAB — CBC WITH DIFFERENTIAL/PLATELET
BASOS ABS: 0 10*3/uL (ref 0.0–0.1)
BASOS PCT: 0 %
EOS ABS: 0 10*3/uL (ref 0.0–0.7)
Eosinophils Relative: 0 %
HEMATOCRIT: 38.3 % — AB (ref 39.0–52.0)
Hemoglobin: 13.5 g/dL (ref 13.0–17.0)
Lymphocytes Relative: 4 %
Lymphs Abs: 0.5 10*3/uL — ABNORMAL LOW (ref 0.7–4.0)
MCH: 31.9 pg (ref 26.0–34.0)
MCHC: 35.2 g/dL (ref 30.0–36.0)
MCV: 90.5 fL (ref 78.0–100.0)
MONO ABS: 0.7 10*3/uL (ref 0.1–1.0)
MONOS PCT: 6 %
NEUTROS ABS: 10.3 10*3/uL — AB (ref 1.7–7.7)
NEUTROS PCT: 90 %
Platelets: 184 10*3/uL (ref 150–400)
RBC: 4.23 MIL/uL (ref 4.22–5.81)
RDW: 12.9 % (ref 11.5–15.5)
WBC: 11.5 10*3/uL — ABNORMAL HIGH (ref 4.0–10.5)

## 2016-07-16 LAB — PROTIME-INR
INR: 1.07
PROTHROMBIN TIME: 14 s (ref 11.4–15.2)

## 2016-07-16 LAB — I-STAT CG4 LACTIC ACID, ED: LACTIC ACID, VENOUS: 1.46 mmol/L (ref 0.5–1.9)

## 2016-07-16 MED ORDER — SODIUM CHLORIDE 0.9 % IV BOLUS (SEPSIS)
500.0000 mL | Freq: Once | INTRAVENOUS | Status: AC
  Administered 2016-07-16: 500 mL via INTRAVENOUS

## 2016-07-16 MED ORDER — DEXTROSE 5 % IV SOLN
1.0000 g | Freq: Once | INTRAVENOUS | Status: AC
  Administered 2016-07-16: 1 g via INTRAVENOUS
  Filled 2016-07-16: qty 10

## 2016-07-16 NOTE — ED Notes (Signed)
Bed: WA03 Expected date:  Expected time:  Means of arrival:  Comments: EMS weakness and fever-recent urinary catheter

## 2016-07-16 NOTE — ED Provider Notes (Signed)
TIME SEEN: 11:14 PM  07/16/2016  By signing my name below, I, Oleh Genin, attest that this documentation has been prepared under the direction and in the presence of [place], DO. Electronically Signed: Oleh Genin, Scribe. 07/16/16. 11:15 PM.  CHIEF COMPLAINT: Generalized Weakness  HPI: This is a 76 year old male with history of diabetes, CAD, HTN, A-fib on Eliquis, and dementia who presents to the ED for evaluation of global weakness. The patient states that in the last 18 hours he has felt "sleepy" and generally weak. However denies any pain complaints. No cough, vomiting, or diarrhea. He was febrile in triage; no fevers at home. He was placed with a foley catheter on 4/3 for retention that is scheduled to be removed outpatient tomorrow morning. No problems with output. Wife reports he had slurred speech earlier today but no other focal neurologic deficits. Speech now back to normal. She reports she has been unable to walk on his own. He normally uses a cane at baseline. He lives at home with his elderly wife.   ROS: See HPI Constitutional: + fever, generalized weakness Eyes: no drainage  ENT: no runny nose   Cardiovascular:  no chest pain  Resp: no SOB  GI: no vomiting GU: no dysuria Integumentary: no rash  Allergy: no hives  Musculoskeletal: no leg swelling  Neurological: no slurred speech ROS otherwise negative  PAST MEDICAL HISTORY/PAST SURGICAL HISTORY:  Past Medical History:  Diagnosis Date  . Anxiety   . Atrial fibrillation, new onset ([place]) 03/27/2015  . CAD (coronary artery disease)   . Colon polyp    Cscope at [facility] aprox 2006, repeated 2010 (-), next 5 years  . Diabetes mellitus with neuropathy ([place])   . Diaphragm paralysis    R, per pt   . DJD (degenerative joint disease)   . Erectile dysfunction   . Eye muscle paralysis    congenital, lateral rectus, left  . Heart palpitations   . Hemorrhoids   . Hyperlipidemia   . Microscopic hematuria 2017    seeing urology  . Mild cognitive disorder    Unspecified mild NCD  . Skin cancer    sees derm    MEDICATIONS:  Prior to Admission medications   Medication Sig Start Date End Date Taking? Authorizing Provider  acetaminophen (TYLENOL) 500 MG tablet Take 1,000 mg by mouth every 6 (six) hours as needed for moderate pain.    Yes Historical Provider, MD  apixaban (ELIQUIS) 5 MG TABS tablet Take 5 mg by mouth 2 (two) times daily.   Yes Historical Provider, MD  buPROPion (WELLBUTRIN) 75 MG tablet Take 1 tablet (75 mg total) by mouth 2 (two) times daily. 05/25/16  Yes Colon Branch, MD  Cinnamon 500 MG capsule Take 500 mg by mouth 2 (two) times daily.    Yes Historical Provider, MD  diltiazem (DILACOR XR) 120 MG 24 hr capsule Take 120 mg by mouth daily.   Yes Historical Provider, MD  donepezil (ARICEPT) 10 MG tablet Take 10 mg by mouth daily. 06/19/16  Yes Historical Provider, MD  FLUoxetine (PROZAC) 20 MG tablet Take 2 tablets (40 mg total) by mouth daily. 07/01/14  Yes Colon Branch, MD  furosemide (LASIX) 20 MG tablet Take 1 tablet (20 mg total) by mouth every other day. 06/16/16  Yes Doreatha Lew, MD  insulin glargine (LANTUS) 100 UNIT/ML injection Inject 5 Units into the skin at bedtime.   Yes Historical Provider, MD  loratadine (CLARITIN) 10 MG tablet  Take 10 mg by mouth every morning.  12/02/14  Yes Colon Branch, MD  losartan (COZAAR) 100 MG tablet Take 1 tablet (100 mg total) by mouth daily. 12/02/14  Yes Colon Branch, MD  memantine (NAMENDA) 5 MG tablet Take 5 mg by mouth 2 (two) times daily. 06/20/16  Yes Historical Provider, MD  metFORMIN (GLUCOPHAGE) 1000 MG tablet Take 1 tablet (1,000 mg total) by mouth 2 (two) times daily with a meal. 12/02/14  Yes Colon Branch, MD  Multiple Vitamin (MULTIVITAMIN) tablet Take 1 tablet by mouth every morning.  08/27/12  Yes Historical Provider, MD  naproxen sodium (ANAPROX) 220 MG tablet Take 220 mg by mouth daily as needed (pain).   Yes Historical Provider, MD   omega-3 acid ethyl esters (LOVAZA) 1 g capsule Take 2 g by mouth 2 (two) times daily.    Yes Historical Provider, MD  omeprazole (PRILOSEC) 20 MG capsule Take 1 capsule (20 mg total) by mouth daily. 12/02/14  Yes Colon Branch, MD  polyethylene glycol [facility]) packet Take 17 g by mouth daily. Patient taking differently: Take 17 g by mouth daily as needed for mild constipation.  07/10/16  Yes Mia A McDonald, PA-C  polyvinyl alcohol (LIQUIFILM TEARS) 1.4 % ophthalmic solution Place 1 drop into both eyes 3 (three) times daily as needed for dry eyes.   Yes Historical Provider, MD  simvastatin (ZOCOR) 40 MG tablet Take 40 mg by mouth daily.    Yes Historical Provider, MD  tamsulosin (FLOMAX) 0.4 MG CAPS capsule Take 0.4 mg by mouth daily.   Yes Historical Provider, MD  vitamin B-12 (CYANOCOBALAMIN) 250 MCG tablet Take 250 mcg by mouth daily.   Yes Historical Provider, MD  Insulin Pen Needle 31G X 5 MM MISC Inject 15 Units into the skin daily at 10 pm. 01/16/16   Colon Branch, MD    ALLERGIES:  Allergies  Allergen Reactions  . Pseudoephedrine Other (See Comments)    Dx: A. Fib.     SOCIAL HISTORY:  Social History  Substance Use Topics  . Smoking status: Former Smoker    Quit date: 01/02/1981  . Smokeless tobacco: Never Used     Comment: quit 1980s  . Alcohol use No    FAMILY HISTORY: Family History  Problem Relation Age of Onset  . Dementia Father   . Emphysema Father   . Diabetes Maternal Grandmother   . Heart disease Sister   . Stroke Sister   . Heart attack Neg Hx   . Hypertension Neg Hx     EXAM: BP (!) 151/72   Pulse 90   Temp (!) 102.9 F (39.4 C) (Rectal)   Resp 18   Ht 6\' 6"  (1.981 m)   Wt 225 lb (102.1 kg)   SpO2 94%   BMI 26.00 kg/m  CONSTITUTIONAL: Alert and oriented To person and place and responds appropriately to questions and are mainly but most of the questions are answered by patient's wife. Elderly appearing; Febrile. Nontoxic appearing. HEAD:  Normocephalic EYES: Conjunctivae clear, pupils appear equal, EOMI ENT: normal nose; moist mucous membranes NECK: Supple, no meningismus, no nuchal rigidity, no LAD  CARD: RRR; S1 and S2 appreciated; no murmurs, no clicks, no rubs, no gallops RESP: Normal chest excursion without splinting or tachypnea; breath sounds clear and equal bilaterally; no wheezes, no rhonchi, no rales, no hypoxia or respiratory distress, speaking full sentences ABD/GI: Normal bowel sounds; non-distended; soft, non-tender, no rebound, no guarding, no peritoneal signs, no  hepatosplenomegaly BACK:  The back appears normal and is non-tender to palpation, there is no CVA tenderness EXT: Normal ROM in all joints; non-tender to palpation; no edema; normal capillary refill; no cyanosis, no calf tenderness or swelling    SKIN: Normal color for age and race; warm; no rash NEURO: Moves all extremities equally, normal speech, cranial nerves II through XII intact, normal sensation PSYCH: The patient's mood and manner are appropriate. Grooming and personal hygiene are appropriate.   MEDICAL DECISION MAKING: Patient here with altered mental status, generalized weakness, episode of slurred speech that has resolved. He was found to be febrile here with a leukocytosis. Normal lactate. No hypotension. Has indwelling Foley catheter that was placed last week. Appears to have urinary tract infection. Culture pending. No previous growth on urine cultures within our system. We'll give IV ceftriaxone, IV fluids. Given patient is altered, weak and unable to ambulate, he will need admission. PCP is Dr. Larose Kells.  Patient's wife is comfortable with this plan. We'll also obtain head CT given patient is on eliquis and has had confusion.  NIHSS 0.  ED PROGRESS: 12:30 AM Discussed patient's case with hospitalist, Dr. Blaine Hamper.  I have recommended admission and patient (and family if present) agree with this plan. Admitting physician will place admission orders.   I  reviewed all nursing notes, vitals, pertinent previous records, EKGs, lab and urine results, imaging (as available).  Head CT pending. Hospitalist will follow up on these results.      EKG Interpretation  Date/Time:  Monday July 16 2016 22:05:22 EDT Ventricular Rate:  94 PR Interval:    QRS Duration: 97 QT Interval:  341 QTC Calculation: 427 R Axis:   39 Text Interpretation:  Sinus rhythm 1st degree AV block No significant change since last tracing Confirmed by WARD,  DO, KRISTEN [phone]) on 07/16/2016 11:05:07 PM        I personally performed the services described in this documentation, which was scribed in my presence. The recorded information has been reviewed and is accurate.    [place], DO 07/17/16 [phone]

## 2016-07-16 NOTE — ED Triage Notes (Signed)
Patient is from home and transported via [facility]. Patient is experiencing generalized weakness with unmeasured fever at home. Pt has a urinary catheter that was supposed to be originally removed on Friday but was not. Then, pt was suppose to have it removed today but with these symptoms, patient was unable to go to the urologist. Pt's spouse reported to EMS patient was unable to stand up today. Patient was able to stand, pivot from the bed to the stretcher. Pt has received TYLENOL 1000mg  PO by EMS. Pt does report to EMS he gets dizzy from a lying to standing position. Also, has regular output of urine, just darker. Denies any nausea, vomiting, or diarrhea.

## 2016-07-17 ENCOUNTER — Observation Stay (HOSPITAL_COMMUNITY): Payer: Medicare Other

## 2016-07-17 DIAGNOSIS — E785 Hyperlipidemia, unspecified: Secondary | ICD-10-CM | POA: Diagnosis present

## 2016-07-17 DIAGNOSIS — G9340 Encephalopathy, unspecified: Secondary | ICD-10-CM | POA: Diagnosis not present

## 2016-07-17 DIAGNOSIS — F32A Depression, unspecified: Secondary | ICD-10-CM | POA: Diagnosis present

## 2016-07-17 DIAGNOSIS — N3000 Acute cystitis without hematuria: Secondary | ICD-10-CM

## 2016-07-17 DIAGNOSIS — F329 Major depressive disorder, single episode, unspecified: Secondary | ICD-10-CM | POA: Diagnosis present

## 2016-07-17 DIAGNOSIS — G309 Alzheimer's disease, unspecified: Secondary | ICD-10-CM

## 2016-07-17 DIAGNOSIS — K219 Gastro-esophageal reflux disease without esophagitis: Secondary | ICD-10-CM | POA: Diagnosis present

## 2016-07-17 DIAGNOSIS — A4151 Sepsis due to Escherichia coli [E. coli]: Secondary | ICD-10-CM | POA: Diagnosis not present

## 2016-07-17 DIAGNOSIS — F0280 Dementia in other diseases classified elsewhere without behavioral disturbance: Secondary | ICD-10-CM

## 2016-07-17 DIAGNOSIS — N390 Urinary tract infection, site not specified: Secondary | ICD-10-CM | POA: Diagnosis present

## 2016-07-17 DIAGNOSIS — I10 Essential (primary) hypertension: Secondary | ICD-10-CM

## 2016-07-17 DIAGNOSIS — I5032 Chronic diastolic (congestive) heart failure: Secondary | ICD-10-CM | POA: Diagnosis present

## 2016-07-17 LAB — BASIC METABOLIC PANEL
ANION GAP: 7 (ref 5–15)
BUN: 14 mg/dL (ref 6–20)
CALCIUM: 8.8 mg/dL — AB (ref 8.9–10.3)
CO2: 26 mmol/L (ref 22–32)
CREATININE: 0.63 mg/dL (ref 0.61–1.24)
Chloride: 103 mmol/L (ref 101–111)
GFR calc Af Amer: >60 mL/min (ref >60)
Glucose, Bld: 161 mg/dL — ABNORMAL HIGH (ref 65–99)
Potassium: 3.8 mmol/L (ref 3.5–5.1)
Sodium: 136 mmol/L (ref 135–145)

## 2016-07-17 LAB — CBC
HCT: 37.7 % — ABNORMAL LOW (ref 39.0–52.0)
HEMOGLOBIN: 13.1 g/dL (ref 13.0–17.0)
MCH: 31.6 pg (ref 26.0–34.0)
MCHC: 34.7 g/dL (ref 30.0–36.0)
MCV: 91.1 fL (ref 78.0–100.0)
PLATELETS: 165 10*3/uL (ref 150–400)
RBC: 4.14 MIL/uL — AB (ref 4.22–5.81)
RDW: 13 % (ref 11.5–15.5)
WBC: 12.1 10*3/uL — ABNORMAL HIGH (ref 4.0–10.5)

## 2016-07-17 LAB — GLUCOSE, CAPILLARY
GLUCOSE-CAPILLARY: 153 mg/dL — AB (ref 65–99)
GLUCOSE-CAPILLARY: 134 mg/dL — AB (ref 65–99)
GLUCOSE-CAPILLARY: 151 mg/dL — AB (ref 65–99)
Glucose-Capillary: 155 mg/dL — ABNORMAL HIGH (ref 65–99)

## 2016-07-17 LAB — BRAIN NATRIURETIC PEPTIDE: B NATRIURETIC PEPTIDE 5: 117 pg/mL — AB (ref 0.0–100.0)

## 2016-07-17 MED ORDER — DONEPEZIL HCL 5 MG PO TABS
10.0000 mg | ORAL_TABLET | Freq: Every day | ORAL | Status: DC
  Administered 2016-07-17: 10 mg via ORAL
  Filled 2016-07-17: qty 2

## 2016-07-17 MED ORDER — VITAMIN B-12 1000 MCG PO TABS
500.0000 ug | ORAL_TABLET | Freq: Every day | ORAL | Status: DC
  Administered 2016-07-17: 500 ug via ORAL
  Filled 2016-07-17: qty 1

## 2016-07-17 MED ORDER — INSULIN ASPART 100 UNIT/ML ~~LOC~~ SOLN
0.0000 [IU] | Freq: Three times a day (TID) | SUBCUTANEOUS | Status: DC
  Administered 2016-07-17: 2 [IU] via SUBCUTANEOUS
  Administered 2016-07-17: 1 [IU] via SUBCUTANEOUS
  Administered 2016-07-17: 2 [IU] via SUBCUTANEOUS

## 2016-07-17 MED ORDER — NAPROXEN 250 MG PO TABS
250.0000 mg | ORAL_TABLET | Freq: Every day | ORAL | Status: DC | PRN
  Filled 2016-07-17: qty 1

## 2016-07-17 MED ORDER — APIXABAN 5 MG PO TABS
5.0000 mg | ORAL_TABLET | Freq: Two times a day (BID) | ORAL | Status: DC
  Administered 2016-07-17 (×2): 5 mg via ORAL
  Filled 2016-07-17 (×2): qty 1

## 2016-07-17 MED ORDER — LEVOFLOXACIN 500 MG PO TABS: 500.0000 mg | ORAL_TABLET | Freq: Every day | ORAL | 0 refills | Status: AC

## 2016-07-17 MED ORDER — FUROSEMIDE 40 MG PO TABS
20.0000 mg | ORAL_TABLET | ORAL | Status: DC
  Administered 2016-07-17: 20 mg via ORAL
  Filled 2016-07-17: qty 1

## 2016-07-17 MED ORDER — OMEGA-3-ACID ETHYL ESTERS 1 G PO CAPS
2.0000 g | ORAL_CAPSULE | Freq: Two times a day (BID) | ORAL | Status: DC
  Administered 2016-07-17 (×2): 2 g via ORAL
  Filled 2016-07-17 (×2): qty 2

## 2016-07-17 MED ORDER — MEMANTINE HCL 5 MG PO TABS
5.0000 mg | ORAL_TABLET | Freq: Two times a day (BID) | ORAL | Status: DC
  Administered 2016-07-17: 5 mg via ORAL
  Filled 2016-07-17 (×2): qty 1

## 2016-07-17 MED ORDER — SODIUM CHLORIDE 0.9% FLUSH
3.0000 mL | Freq: Two times a day (BID) | INTRAVENOUS | Status: DC
  Administered 2016-07-17 (×2): 3 mL via INTRAVENOUS

## 2016-07-17 MED ORDER — DEXTROSE 5 % IV SOLN
1.0000 g | INTRAVENOUS | Status: DC
  Filled 2016-07-17: qty 10

## 2016-07-17 MED ORDER — LOSARTAN POTASSIUM 50 MG PO TABS
100.0000 mg | ORAL_TABLET | Freq: Every day | ORAL | Status: DC
  Administered 2016-07-17: 100 mg via ORAL
  Filled 2016-07-17: qty 2

## 2016-07-17 MED ORDER — ZOLPIDEM TARTRATE 5 MG PO TABS: 5.0000 mg | ORAL_TABLET | Freq: Every evening | ORAL | Status: DC | PRN

## 2016-07-17 MED ORDER — ATORVASTATIN CALCIUM 10 MG PO TABS: 20.0000 mg | ORAL_TABLET | Freq: Every day | ORAL | Status: DC

## 2016-07-17 MED ORDER — POLYETHYLENE GLYCOL 3350 17 G PO PACK
17.0000 g | PACK | Freq: Every day | ORAL | Status: DC
  Administered 2016-07-17: 17 g via ORAL
  Filled 2016-07-17: qty 1

## 2016-07-17 MED ORDER — SIMVASTATIN 40 MG PO TABS
40.0000 mg | ORAL_TABLET | Freq: Every day | ORAL | Status: DC
  Administered 2016-07-17: 40 mg via ORAL
  Filled 2016-07-17: qty 1

## 2016-07-17 MED ORDER — CINNAMON 500 MG PO CAPS: 500.0000 mg | ORAL_CAPSULE | Freq: Two times a day (BID) | ORAL | Status: DC

## 2016-07-17 MED ORDER — FLUOXETINE HCL 20 MG PO CAPS
40.0000 mg | ORAL_CAPSULE | Freq: Every day | ORAL | Status: DC
  Administered 2016-07-17: 40 mg via ORAL
  Filled 2016-07-17: qty 2

## 2016-07-17 MED ORDER — BUPROPION HCL 75 MG PO TABS
75.0000 mg | ORAL_TABLET | Freq: Two times a day (BID) | ORAL | Status: DC
  Administered 2016-07-17: 75 mg via ORAL
  Filled 2016-07-17 (×2): qty 1

## 2016-07-17 MED ORDER — POLYVINYL ALCOHOL 1.4 % OP SOLN
1.0000 [drp] | Freq: Three times a day (TID) | OPHTHALMIC | Status: DC | PRN
  Filled 2016-07-17: qty 15

## 2016-07-17 MED ORDER — ADULT MULTIVITAMIN W/MINERALS CH
1.0000 | ORAL_TABLET | Freq: Every morning | ORAL | Status: DC
  Administered 2016-07-17: 1 via ORAL
  Filled 2016-07-17: qty 1

## 2016-07-17 MED ORDER — LORATADINE 10 MG PO TABS
10.0000 mg | ORAL_TABLET | Freq: Every morning | ORAL | Status: DC
  Administered 2016-07-17: 10 mg via ORAL
  Filled 2016-07-17 (×2): qty 1

## 2016-07-17 MED ORDER — DILTIAZEM HCL ER COATED BEADS 120 MG PO CP24
120.0000 mg | ORAL_CAPSULE | Freq: Every day | ORAL | Status: DC
Start: 2016-07-17 — End: 2016-07-17
  Administered 2016-07-17: 120 mg via ORAL
  Filled 2016-07-17: qty 1

## 2016-07-17 MED ORDER — PANTOPRAZOLE SODIUM 40 MG PO TBEC
40.0000 mg | DELAYED_RELEASE_TABLET | Freq: Every day | ORAL | Status: DC
  Administered 2016-07-17: 40 mg via ORAL
  Filled 2016-07-17: qty 1

## 2016-07-17 MED ORDER — TAMSULOSIN HCL 0.4 MG PO CAPS
0.4000 mg | ORAL_CAPSULE | Freq: Every day | ORAL | Status: DC
  Administered 2016-07-17: 0.4 mg via ORAL
  Filled 2016-07-17: qty 1

## 2016-07-17 MED ORDER — INSULIN GLARGINE 100 UNIT/ML ~~LOC~~ SOLN
4.0000 [IU] | Freq: Every day | SUBCUTANEOUS | Status: DC
  Administered 2016-07-17: 4 [IU] via SUBCUTANEOUS
  Filled 2016-07-17 (×2): qty 0.04

## 2016-07-17 MED ORDER — ACETAMINOPHEN 500 MG PO TABS
1000.0000 mg | ORAL_TABLET | Freq: Four times a day (QID) | ORAL | Status: DC | PRN
  Administered 2016-07-17 (×2): 1000 mg via ORAL
  Filled 2016-07-17 (×2): qty 2

## 2016-07-17 NOTE — Progress Notes (Signed)
[facility]   Patient admitted to midnight see H&P for full details   76 year old male with medical history significant for dementia, hypertension, diabetes mellitus, CAD, A. Fib on Eliquis, and fever and generalized weakness and some confusion. Patient had Foley placement 4 weeks ago due to urinary retention which was removed in the ED. Patient was admitted for sepsis secondary to UTI started on IV antibiotics and IV fluids. Patient has clinically improve and wishes to go home. Wife was concerned about his walking so physical therapy was consulted and recommending home health PT. Since patient has clinically improve and has no complaints will discharge patient home oral Levaquin, urine cultures are pending and will follow for identification and sensitivity in case that oral antibiotics need to change. Patient advised to follow-up with PCP in 1 week.   Full discharge summary to follow   Chipper Oman, MD

## 2016-07-17 NOTE — ED Notes (Signed)
Provided ice water with permission from Dr. Leonides Schanz.

## 2016-07-17 NOTE — Evaluation (Signed)
Physical Therapy Evaluation Patient Details Name: Ivan Anderson MRN: [id number] DOB: 04/25/1940 Today's Date: 07/17/2016   History of Present Illness  76 yo male admitted with UTI, sepsis, weakness. Hx of dementia, HTN, DM, CAD, Afib, CHF  Clinical Impression  On eval, pt required Min guard-Min assist for mobility. He walked ~250' with a RW and then 33 ' without a device. He climbed 2 steps with the use of 1 rail. Mildly unsteady at times. Discussed d/c plan with pt and wife. Recommend RW use for safe ambulation until pt returns to baseline. Instructed pt/wife to have neighbor help her assist pt into home, if needed.     Follow Up Recommendations Home health PT; Home Health OT; Supervision/Assistance - 24 hour    Equipment Recommendations  None recommended by PT    Recommendations for Other Services       Precautions / Restrictions Precautions Precautions: Fall Restrictions Weight Bearing Restrictions: No      Mobility  Bed Mobility Overal bed mobility: Needs Assistance Bed Mobility: Supine to Sit     Supine to sit: Supervision     General bed mobility comments: for safety  Transfers Overall transfer level: Needs assistance Equipment used: Rolling walker (2 wheeled);None Transfers: Sit to/from Stand Sit to Stand: Min guard         General transfer comment: close guard for safety.   Ambulation/Gait Ambulation/Gait assistance: Min assist;Min guard Ambulation Distance (Feet): 250 Feet (250'x1, 50'x1) Assistive device: Rolling walker (2 wheeled);None Gait Pattern/deviations: Step-through pattern;Decreased stride length     General Gait Details: Walked x 1 with RW. Then x1 without any device. Intermittent assist to stabilize. Pt tolerated distance well.   Stairs Stairs: Yes Stairs assistance: Min assist Stair Management: Step to pattern;Forwards;One rail Right Number of Stairs: 2 General stair comments: Assist to stabilize. Pt requires at least 1 point of  support to safely negotiate stairs.   Wheelchair Mobility    Modified Rankin (Stroke Patients Only)       Balance Overall balance assessment: Needs assistance           Standing balance-Leahy Scale: Fair                               Pertinent Vitals/Pain Pain Assessment: No/denies pain    Home Living Family/patient expects to be discharged to:: Private residence Living Arrangements: Spouse/significant other Available Help at Discharge: Family Type of Home: House Home Access: Stairs to enter Entrance Stairs-Rails: None Entrance Stairs-Number of Steps: 2 Home Layout: One level Home Equipment: Cane - single point;Walker - 2 wheels      Prior Function           Comments: using cane for ambulation     Hand Dominance        Extremity/Trunk Assessment   Upper Extremity Assessment Upper Extremity Assessment: Generalized weakness    Lower Extremity Assessment Lower Extremity Assessment: Generalized weakness    Cervical / Trunk Assessment Cervical / Trunk Assessment: Normal  Communication      Cognition Arousal/Alertness: Awake/alert Behavior During Therapy: WFL for tasks assessed/performed Overall Cognitive Status: History of cognitive impairments - at baseline                                        General Comments      Exercises  Assessment/Plan    PT Assessment Patient needs continued PT services  PT Problem List Decreased mobility;Decreased balance;Decreased knowledge of use of DME       PT Treatment Interventions DME instruction;Therapeutic activities;Gait training;Therapeutic exercise;Functional mobility training;Balance training;Patient/family education    PT Goals (Current goals can be found in the Care Plan section)  Acute Rehab PT Goals Patient Stated Goal: home PT Goal Formulation: With patient/family Time For Goal Achievement: 07/31/16 Potential to Achieve Goals: Good    Frequency Min  3X/week   Barriers to discharge        Co-evaluation               End of Session Equipment Utilized During Treatment: Gait belt Activity Tolerance: Patient tolerated treatment well Patient left: in bed;with call bell/phone within reach;with family/visitor present   PT Visit Diagnosis: Muscle weakness (generalized) (M62.81);Difficulty in walking, not elsewhere classified (R26.2)    Time: 1423-9532 PT Time Calculation (min) (ACUTE ONLY): 19 min   Charges:   PT Evaluation $PT Eval Low Complexity: 1 Procedure     PT G Codes:   PT G-Codes **NOT FOR INPATIENT CLASS** Functional Assessment Tool Used: AM-PAC 6 Clicks Basic Mobility;Clinical judgement Functional Limitation: Mobility: Walking and moving around Mobility: Walking and Moving Around Current Status (Y2334): At least 1 percent but less than 20 percent impaired, limited or restricted Mobility: Walking and Moving Around Goal Status [phone]): At least 1 percent but less than 20 percent impaired, limited or restricted Mobility: Walking and Moving Around Discharge Status [phone]): At least 1 percent but less than 20 percent impaired, limited or restricted      Weston Anna, MPT Pager: [phone]

## 2016-07-17 NOTE — Progress Notes (Signed)
Spoke with pt and wife at bedside,  will follow for HHPT if needed. Both pt and wife are aware.

## 2016-07-17 NOTE — H&P (Signed)
History and Physical    Ivan Anderson LOV:[id number] DOB: 1940/09/03 DOA: 07/16/2016  Referring MD/NP/PA:   PCP: [name], MD   Patient coming from:  The patient is coming from home.  At baseline, pt is independent for most of ADL.   Chief Complaint: Fever, generalized weakness, and altered mental status  HPI: Ivan Anderson is a 76 y.o. male with medical history significant of dementia, hypertension, hyperlipidemia, diabetes mellitus, GERD, depression, CAD, atrial fibrillation on Eliquis, BPH, dCHF, who presents with fever, generalized weakness, altered mental status.  Per pt's wife, pt had Foley catheter placed due to urinary retention 4 weeks ago. The catheter was supposed to be originally removed on Friday but was not. Patient developed a fever, generalized weakness, dizziness since yesterday. He is more confused than his baseline. Patient does not have unilateral weakness or numbness in extremities. No facial droop or hearing loss. No fall. When I saw pt in ED, he has mild confusion, but is oriented 3. He denies symptoms of UTI, hematuria. No nausea, vomiting, diarrhea, abdominal pain, chest pain and cough.  ED Course: pt was found to have positive urinalysis with large amount of leukocyte, WBC 7.4, lactate 1.46, INR 1.07, electrolytes renal function okay, temperature 102.9, O2 sat 94% on room air. Negative chest x-ray. CT head is negative for acute intracranial abnormalities.  Review of Systems:   General: has fevers, chills, no changes in body weight, has poor appetite, has fatigue HEENT: no blurry vision, hearing changes or sore throat Respiratory: no dyspnea, coughing, wheezing CV: no chest pain, no palpitations GI: no nausea, vomiting, abdominal pain, diarrhea, constipation GU: no dysuria, burning on urination, increased urinary frequency, hematuria  Ext: no leg edema Neuro: no unilateral weakness, numbness, or tingling, no vision change or hearing loss Skin: no rash, no skin  tear. MSK: No muscle spasm, no deformity, no limitation of range of movement in spin. Has dizziness. Heme: No easy bruising.  Travel history: No recent long distant travel.  Allergy:  Allergies  Allergen Reactions  . Pseudoephedrine Other (See Comments)    Dx: A. Fib.     Past Medical History:  Diagnosis Date  . Anxiety   . Atrial fibrillation, new onset ([place]) 03/27/2015  . CAD (coronary artery disease)   . Colon polyp    [facility]scope [facility] aprox 2006, repeated 2010 (-), next 5 years  . Diabetes mellitus with neuropathy ([place])   . Diaphragm paralysis    R, per pt   . DJD (degenerative joint disease)   . Erectile dysfunction   . Eye muscle paralysis    congenital, lateral rectus, left  . Heart palpitations   . Hemorrhoids   . Hyperlipidemia   . Microscopic hematuria 2017   seeing urology  . Mild cognitive disorder    Unspecified mild NCD  . Skin cancer    sees derm    Past Surgical History:  Procedure Laterality Date  . ANKLE FUSION Right   . APPENDECTOMY    . CARPAL TUNNEL RELEASE     B, 2000  . CYSTOSCOPY  2017  . HIP SURGERY Right 1964   pins  . HIP SURGERY  2009   RIGHT--s/p hip replacement, s/p revision August 2009 due to dislocation x 3   . HIP SURGERY  04-2012, 2017   R hip revision  . KNEE SURGERY Left 2005  . MEDIAL PARTIAL KNEE REPLACEMENT Right 08-2012  . TOTAL HIP ARTHROPLASTY  08-09-15   [facility]  Social History:  reports that he quit smoking about 35 years ago. He has never used smokeless tobacco. He reports that he does not drink alcohol or use drugs.  Family History:  Family History  Problem Relation Age of Onset  . Dementia Father   . Emphysema Father   . Diabetes Maternal Grandmother   . Heart disease Sister   . Stroke Sister   . Heart attack Neg Hx   . Hypertension Neg Hx      Prior to Admission medications   Medication Sig Start Date End Date Taking? Authorizing Provider  acetaminophen (TYLENOL) 500 MG tablet Take 1,000 mg by  mouth every 6 (six) hours as needed for moderate pain.    Yes Historical Provider, MD  apixaban (ELIQUIS) 5 MG TABS tablet Take 5 mg by mouth 2 (two) times daily.   Yes Historical Provider, MD  buPROPion (WELLBUTRIN) 75 MG tablet Take 1 tablet (75 mg total) by mouth 2 (two) times daily. 05/25/16  Yes Colon Branch, MD  Cinnamon 500 MG capsule Take 500 mg by mouth 2 (two) times daily.    Yes Historical Provider, MD  diltiazem (DILACOR XR) 120 MG 24 hr capsule Take 120 mg by mouth daily.   Yes Historical Provider, MD  donepezil (ARICEPT) 10 MG tablet Take 10 mg by mouth daily. 06/19/16  Yes Historical Provider, MD  FLUoxetine (PROZAC) 20 MG tablet Take 2 tablets (40 mg total) by mouth daily. 07/01/14  Yes Colon Branch, MD  furosemide (LASIX) 20 MG tablet Take 1 tablet (20 mg total) by mouth every other day. 06/16/16  Yes Doreatha Lew, MD  insulin glargine (LANTUS) 100 UNIT/ML injection Inject 5 Units into the skin at bedtime.   Yes Historical Provider, MD  loratadine (CLARITIN) 10 MG tablet Take 10 mg by mouth every morning.  12/02/14  Yes Colon Branch, MD  losartan (COZAAR) 100 MG tablet Take 1 tablet (100 mg total) by mouth daily. 12/02/14  Yes Colon Branch, MD  memantine (NAMENDA) 5 MG tablet Take 5 mg by mouth 2 (two) times daily. 06/20/16  Yes Historical Provider, MD  metFORMIN (GLUCOPHAGE) 1000 MG tablet Take 1 tablet (1,000 mg total) by mouth 2 (two) times daily with a meal. 12/02/14  Yes Colon Branch, MD  Multiple Vitamin (MULTIVITAMIN) tablet Take 1 tablet by mouth every morning.  08/27/12  Yes Historical Provider, MD  naproxen sodium (ANAPROX) 220 MG tablet Take 220 mg by mouth daily as needed (pain).   Yes Historical Provider, MD  omega-3 acid ethyl esters (LOVAZA) 1 g capsule Take 2 g by mouth 2 (two) times daily.    Yes Historical Provider, MD  omeprazole (PRILOSEC) 20 MG capsule Take 1 capsule (20 mg total) by mouth daily. 12/02/14  Yes Colon Branch, MD  polyethylene glycol [facility]) packet Take 17 g by  mouth daily. Patient taking differently: Take 17 g by mouth daily as needed for mild constipation.  07/10/16  Yes Mia A McDonald, PA-C  polyvinyl alcohol (LIQUIFILM TEARS) 1.4 % ophthalmic solution Place 1 drop into both eyes 3 (three) times daily as needed for dry eyes.   Yes Historical Provider, MD  simvastatin (ZOCOR) 40 MG tablet Take 40 mg by mouth daily.    Yes Historical Provider, MD  tamsulosin (FLOMAX) 0.4 MG CAPS capsule Take 0.4 mg by mouth daily.   Yes Historical Provider, MD  vitamin B-12 (CYANOCOBALAMIN) 250 MCG tablet Take 250 mcg by mouth daily.   Yes  Historical Provider, MD  Insulin Pen Needle 31G X 5 MM MISC Inject 15 Units into the skin daily at 10 pm. 01/16/16   Colon Branch, MD    Physical Exam: Vitals:   07/16/16 2345 07/17/16 0016 07/17/16 0045 07/17/16 0138  BP: 127/67 (!) 145/80 (!) 146/77 133/63  Pulse: 83 82 77 67  Resp: (!) 26 (!) 22 (!) 24 20  Temp:  98.3 F (36.8 C)  99 F (37.2 C)  TempSrc:  Oral  Oral  SpO2:  98% 100% 98%  Weight:    100.5 kg (221 lb 9 oz)  Height:    6\' 7"  (2.007 m)   General: Not in acute distress HEENT:       Eyes: PERRL, EOMI, no scleral icterus.       ENT: No discharge from the ears and nose, no pharynx injection, no tonsillar enlargement.        Neck: No JVD, no bruit, no mass felt. Heme: No neck lymph node enlargement. Cardiac: S1/S2, RRR, No murmurs, No gallops or rubs. Respiratory: No rales, wheezing, rhonchi or rubs. GI: Soft, nondistended, nontender, no rebound pain, no organomegaly, BS present. GU: No hematuria Ext: No pitting leg edema bilaterally. 2+DP/PT pulse bilaterally. Musculoskeletal: No joint deformities, No joint redness or warmth, no limitation of ROM in spin. Skin: No rashes.  Neuro: Alert, mildly confused, but oriented X3, cranial nerves II-XII grossly intact, moves all extremities normally. Psych: Patient is not psychotic.  Labs on Admission: I have personally reviewed following labs and imaging  studies  CBC:  Recent Labs Lab 07/10/16 1505 07/16/16 2204 07/17/16 0200  WBC 7.1 11.5* 12.1*  NEUTROABS  --  10.3*  --   HGB 14.4 13.5 13.1  HCT 40.6 38.3* 37.7*  MCV 91.0 90.5 91.1  PLT 209 184 672   Basic Metabolic Panel:  Recent Labs Lab 07/10/16 1505 07/16/16 2204 07/17/16 0200  NA 137 135 136  K 4.4 3.9 3.8  CL 101 103 103  CO2 27 25 26   GLUCOSE 153* 198* 161*  BUN 15 15 14   CREATININE 0.72 0.74 0.63  CALCIUM 9.4 9.0 8.8*   GFR: Estimated Creatinine Clearance: 100.9 mL/min (by C-G formula based on SCr of 0.63 mg/dL). Liver Function Tests:  Recent Labs Lab 07/16/16 2204  AST 14*  ALT 15*  ALKPHOS 92  BILITOT 0.6  PROT 6.8  ALBUMIN 3.9   No results for input(s): LIPASE, AMYLASE in the last 168 hours. No results for input(s): AMMONIA in the last 168 hours. Coagulation Profile:  Recent Labs Lab 07/16/16 2204  INR 1.07   Cardiac Enzymes: No results for input(s): CKTOTAL, CKMB, CKMBINDEX, TROPONINI in the last 168 hours. BNP (last 3 results) No results for input(s): PROBNP in the last 8760 hours. HbA1C: No results for input(s): HGBA1C in the last 72 hours. CBG:  Recent Labs Lab 07/17/16 0153  GLUCAP 153*   Lipid Profile: No results for input(s): CHOL, HDL, LDLCALC, TRIG, CHOLHDL, LDLDIRECT in the last 72 hours. Thyroid Function Tests: No results for input(s): TSH, T4TOTAL, FREET4, T3FREE, THYROIDAB in the last 72 hours. Anemia Panel: No results for input(s): VITAMINB12, FOLATE, FERRITIN, TIBC, IRON, RETICCTPCT in the last 72 hours. Urine analysis:    Component Value Date/Time   COLORURINE YELLOW 07/16/2016 2204   APPEARANCEUR CLOUDY (A) 07/16/2016 2204   LABSPEC 1.017 07/16/2016 2204   PHURINE 5.0 07/16/2016 2204   GLUCOSEU 50 (A) 07/16/2016 2204   GLUCOSEU NEGATIVE 12/27/2015 1240   [place] (  A) 07/16/2016 [place] 07/16/2016 2204   KETONESUR 5 (A) 07/16/2016 2204   PROTEINUR 30 (A) 07/16/2016 2204    UROBILINOGEN 1.0 12/27/2015 1240   NITRITE NEGATIVE 07/16/2016 2204   LEUKOCYTESUR LARGE (A) 07/16/2016 2204   Sepsis Labs: @LABRCNTIP (procalcitonin:4,lacticidven:4) )No results found for this or any previous visit (from the past 240 hour(s)).   Radiological Exams on Admission: Dg Chest 2 View  Result Date: 07/16/2016 CLINICAL DATA:  Weakness and fever EXAM: CHEST  2 VIEW COMPARISON:  Chest radiograph 06/15/2016 FINDINGS: The heart size and mediastinal contours are within normal limits. Both lungs are clear. The visualized skeletal structures are unremarkable. IMPRESSION: No active cardiopulmonary disease. Electronically Signed   By: Ulyses Jarred M.D.   On: 07/16/2016 22:46   Ct Head Wo Contrast  Result Date: 07/17/2016 CLINICAL DATA:  Acute onset of altered mental status. Generalized weakness and sleepiness. Difficulty ambulating. Patient on Eliquis. Initial encounter. EXAM: CT HEAD WITHOUT CONTRAST TECHNIQUE: Contiguous axial images were obtained from the base of the skull through the vertex without intravenous contrast. COMPARISON:  CT of the head performed 06/15/2016, and MRI of the brain performed 12/17/2015 FINDINGS: Brain: No evidence of acute infarction, hemorrhage, hydrocephalus, extra-axial collection or mass lesion/mass effect. Prominence of the ventricles and sulci reflects mild to moderate cortical volume loss. Cerebellar atrophy is noted. Scattered periventricular and subcortical matter change likely reflects small vessel ischemic microangiopathy. The brainstem and fourth ventricle are within normal limits. The basal ganglia are unremarkable in appearance. The cerebral hemispheres demonstrate grossly normal gray-white differentiation. No mass effect or midline shift is seen. Vascular: No hyperdense vessel or unexpected calcification. Skull: There is no evidence of fracture; visualized osseous structures are unremarkable in appearance. Sinuses/Orbits: The orbits are within normal limits.  The paranasal sinuses and mastoid air cells are well-aerated. Other: No significant soft tissue abnormalities are seen. IMPRESSION: 1. No acute intracranial pathology seen on CT. 2. Mild to moderate cortical volume loss and scattered small vessel ischemic microangiopathy. Electronically Signed   By: Garald Balding M.D.   On: 07/17/2016 01:34     EKG: Independently reviewed. Sinus rhythm, QTC 427, anteroseptal infarction pattern.    Assessment/Plan Principal Problem:   UTI (urinary tract infection) Active Problems:   Diabetes ([place])   Dyslipidemia   Essential hypertension   Coronary atherosclerosis   Paroxysmal atrial fibrillation (HCC)   Dementia   Acute encephalopathy   HLD (hyperlipidemia)   GERD (gastroesophageal reflux disease)   Depression   Chronic diastolic CHF (congestive heart failure) (HCC)   UTI and sepsis: Urinalysis positive for large amount of leukocyte. Patient meet criteria for sepsis with leukocytosis, fever and tachypnea. Lactate is normal. Hemodynamically stable.   - Place on  telemetry bed for obs -  Ceftriaxone by IV - remove Foley cath - Follow up results of urine and blood cx and amend antibiotic regimen if needed per sensitivity results -  IVF: 500 mL of NS bolus in ED (patient has a congestive heart failure, limiting aggressive IV fluids treatment, and also he has normal lactic acid).  Acute encephalopathy: mild, due to UTI. Ct-head negative -treat UTI as above -neuro check frequently  DM-II: Last A1c 6.0 on 05/25/16, well controled. Patient is taking metformin and Lantus at home -will decrease Lantus dose from 5 units to 4 units daily   -SSI  Atrial Fibrillation: CHA2DS2-VASc Score is 6, needs oral anticoagulation. Patient is on Eliquis at home. Heart rate is well controlled. -continue eliquis -continue Diltiazem  HTN: -on cozarr, Diltilzem and lasix  Chronic diastolic congestive heart failure: 2-D echo on 06/16/16 showed EF of 60-65 percent with  grade 1 diastolic dysfunction. Patient does not have leg edema CHF is compensated -Continue Lasix -Check BNP  HLD: -zocor  CAD: no CP -Continue Zocor  Dementia: -Donepezil and Namenda  Depression: Stable, no suicidal or homicidal ideations. -Continue home medications: Wellbutrin, Prozac,  GERD: -Protonix  DVT ppx: SQ Lovenox Code Status: Full code Family Communication: Yes, patient's wife at bed side Disposition Plan:  Anticipate discharge back to previous home environment Consults called:  none Admission status: Obs / tele  Date of Service 07/17/2016    Ivor Costa [facility] Pager [phone]  If 7PM-7AM, please contact night-coverage [email or web address] Password [facility] 07/17/2016, 3:43 AM

## 2016-07-17 NOTE — ED Notes (Signed)
Will give bedside report to [facility], RN for room 1437. Pt will go to CT first before transporting upstairs.

## 2016-07-17 NOTE — ED Notes (Signed)
Pt transported to CT ?

## 2016-07-18 ENCOUNTER — Telehealth: Payer: Self-pay | Admitting: Behavioral Health

## 2016-07-18 NOTE — Progress Notes (Signed)
Spoke with pt's wife concerning HH. [facility] is unable to service pt related to insurance [employer]. Pt's wife states that they will not need HHPT/pt will go to [facility].

## 2016-07-18 NOTE — Telephone Encounter (Signed)
Per the patient's spouse, he's doing well now that the catheter has been removed and currently taking medication. Declines hospital follow-up visit at this time. Patient already has a 6 week follow-up appointment in place for August 06, 2016 at 11:00 AM with PCP. They will call the office sooner if there are any changes.

## 2016-07-18 NOTE — Discharge Summary (Signed)
Physician Discharge Summary  Ivan Anderson  GQQ:[id number]  DOB: 1941-01-16  DOA: 07/16/2016 PCP: [name], MD  Admit date: 07/16/2016 Discharge date: 07/18/2016  Admitted From: Home  Disposition:  Home  Recommendations for Outpatient Follow-up:  1. Follow up with PCP in 1-2 weeks 2. Please obtain BMP/CBC in one week 3. Please follow up on the following pending results: Final E. Coli sensitivities for urine cultures and final blood cultures NGTD  Home Health: [facility] PT/OT   Discharge Condition: Improved CODE STATUS: FULL  Diet recommendation: Heart Healthy   Brief/Interim Summary: 76 y/o M with medical hx significant for dementia, hypertension, DM, Afib on Eliquis and CAD, who presented to the ED complaining of fever, generalized weakness and confusion. Patient had Foley placement 4 weeks PTA due to urinary retention which was supposed to be removed 5 PTA  and was not, therefore cath was removed in the ED PTA. Patient was admitted for sepsis secondary to UTI started on IV antibiotics and IV fluids. Patient has clinically improve and wishes to go home. Wife was concerned about his walking so physical therapy was consulted and recommending home health PT. Urine cultures was positive for  E. Coli. Since patient is has clinically improved and wife report that he is bact to baseline, patient was transitioned to PO Levaquin. Urine sensitivities pending. Patient remained afebrile during the hospital stay. Patient tolerated diet well, and had no complaints at the time of discharge. Patient was advised to follow up with PCP in 1 week.   Discharge Diagnoses/Hospital Course:  Sepsis 2/2 to E. Coli UTI - sepsis physiology resolved Initially treated with IV Rocephin for 1 dose, the transitioned to Levaquin PO x 5 days  Good urine output after catheter removal  Sensitivities pending - will follow in case abx need to be changed.  Follow up with PCP   E. Coli UTI - see above  Acute metabolic encephalopathy -  2/2 to UTI  Resolved - wife report that patient is back to baseline  CT head negative  Dementia at baseline   All other chronic medical condition were stable during the hospitalization and no changes in medication were done for his chronic conditions. Patient was seen by physical therapy, recommending HH PT/OT On the day of the discharge the patient's vitals were stable, and no other acute medical condition were reported by patient. Patient was felt safe to be discharge to home  Discharge Instructions  You were cared for by a hospitalist during your hospital stay. If you have any questions about your discharge medications or the care you received while you were in the hospital after you are discharged, you can call the unit and asked to speak with the hospitalist on call if the hospitalist that took care of you is not available. Once you are discharged, your primary care physician will handle any further medical issues. Please note that NO REFILLS for any discharge medications will be authorized once you are discharged, as it is imperative that you return to your primary care physician (or establish a relationship with a primary care physician if you do not have one) for your aftercare needs so that they can reassess your need for medications and monitor your lab values.  Discharge Instructions    Diet - low sodium heart healthy    Complete by:  As directed    Increase activity slowly    Complete by:  As directed      Allergies as of 07/17/2016  Reactions   Pseudoephedrine Other (See Comments)   Dx: A. Fib.       Medication List    TAKE these medications   acetaminophen 500 MG tablet Commonly known as:  TYLENOL Take 1,000 mg by mouth every 6 (six) hours as needed for moderate pain.   buPROPion 75 MG tablet Commonly known as:  WELLBUTRIN Take 1 tablet (75 mg total) by mouth 2 (two) times daily.   Cinnamon 500 MG capsule Take 500 mg by mouth 2 (two) times daily.   diltiazem  120 MG 24 hr capsule Commonly known as:  DILACOR XR Take 120 mg by mouth daily.   donepezil 10 MG tablet Commonly known as:  ARICEPT Take 10 mg by mouth daily.   ELIQUIS 5 MG Tabs tablet Generic drug:  apixaban Take 5 mg by mouth 2 (two) times daily.   FLUoxetine 20 MG tablet Commonly known as:  PROZAC Take 2 tablets (40 mg total) by mouth daily.   furosemide 20 MG tablet Commonly known as:  LASIX Take 1 tablet (20 mg total) by mouth every other day.   insulin glargine 100 UNIT/ML injection Commonly known as:  LANTUS Inject 5 Units into the skin at bedtime.   Insulin Pen Needle 31G X 5 MM Misc Inject 15 Units into the skin daily at 10 pm.   levofloxacin 500 MG tablet Commonly known as:  LEVAQUIN Take 1 tablet (500 mg total) by mouth daily.   loratadine 10 MG tablet Commonly known as:  CLARITIN Take 10 mg by mouth every morning.   losartan 100 MG tablet Commonly known as:  COZAAR Take 1 tablet (100 mg total) by mouth daily.   memantine 5 MG tablet Commonly known as:  NAMENDA Take 5 mg by mouth 2 (two) times daily.   metFORMIN 1000 MG tablet Commonly known as:  GLUCOPHAGE Take 1 tablet (1,000 mg total) by mouth 2 (two) times daily with a meal.   multivitamin tablet Take 1 tablet by mouth every morning.   naproxen sodium 220 MG tablet Commonly known as:  ANAPROX Take 220 mg by mouth daily as needed (pain).   omega-3 acid ethyl esters 1 g capsule Commonly known as:  LOVAZA Take 2 g by mouth 2 (two) times daily.   omeprazole 20 MG capsule Commonly known as:  PRILOSEC Take 1 capsule (20 mg total) by mouth daily.   polyethylene glycol packet Commonly known as:  MIRALAX Take 17 g by mouth daily. What changed:  when to take this  reasons to take this   polyvinyl alcohol 1.4 % ophthalmic solution Commonly known as:  LIQUIFILM TEARS Place 1 drop into both eyes 3 (three) times daily as needed for dry eyes.   simvastatin 40 MG tablet Commonly known as:   ZOCOR Take 40 mg by mouth daily.   tamsulosin 0.4 MG Caps capsule Commonly known as:  FLOMAX Take 0.4 mg by mouth daily.   vitamin B-12 250 MCG tablet Commonly known as:  CYANOCOBALAMIN Take 250 mcg by mouth daily.      [place], MD. Schedule an appointment as soon as possible for a visit in 1 week(s).   Specialty:  Internal Medicine Contact information: [address] [place] [address] [phone]          Allergies  Allergen Reactions  . Pseudoephedrine Other (See Comments)    Dx: A. Fib.     Consultations:  None   Procedures/Studies: Dg  Chest 2 View  Result Date: 07/16/2016 CLINICAL DATA:  Weakness and fever EXAM: CHEST  2 VIEW COMPARISON:  Chest radiograph 06/15/2016 FINDINGS: The heart size and mediastinal contours are within normal limits. Both lungs are clear. The visualized skeletal structures are unremarkable. IMPRESSION: No active cardiopulmonary disease. Electronically Signed   By: Ulyses Jarred M.D.   On: 07/16/2016 22:46   Ct Head Wo Contrast  Result Date: 07/17/2016 CLINICAL DATA:  Acute onset of altered mental status. Generalized weakness and sleepiness. Difficulty ambulating. Patient on Eliquis. Initial encounter. EXAM: CT HEAD WITHOUT CONTRAST TECHNIQUE: Contiguous axial images were obtained from the base of the skull through the vertex without intravenous contrast. COMPARISON:  CT of the head performed 06/15/2016, and MRI of the brain performed 12/17/2015 FINDINGS: Brain: No evidence of acute infarction, hemorrhage, hydrocephalus, extra-axial collection or mass lesion/mass effect. Prominence of the ventricles and sulci reflects mild to moderate cortical volume loss. Cerebellar atrophy is noted. Scattered periventricular and subcortical matter change likely reflects small vessel ischemic microangiopathy. The brainstem and fourth ventricle are within normal limits. The basal ganglia are unremarkable in appearance.  The cerebral hemispheres demonstrate grossly normal gray-white differentiation. No mass effect or midline shift is seen. Vascular: No hyperdense vessel or unexpected calcification. Skull: There is no evidence of fracture; visualized osseous structures are unremarkable in appearance. Sinuses/Orbits: The orbits are within normal limits. The paranasal sinuses and mastoid air cells are well-aerated. Other: No significant soft tissue abnormalities are seen. IMPRESSION: 1. No acute intracranial pathology seen on CT. 2. Mild to moderate cortical volume loss and scattered small vessel ischemic microangiopathy. Electronically Signed   By: Garald Balding M.D.   On: 07/17/2016 01:34   US Carotid Duplex Bilateral  Result Date: 07/05/2016 CLINICAL DATA:  Dizziness for 3 weeks EXAM: BILATERAL CAROTID DUPLEX ULTRASOUND TECHNIQUE: Pearline Cables scale imaging, color Doppler and duplex ultrasound were performed of bilateral carotid and vertebral arteries in the neck. COMPARISON:  None. FINDINGS: Criteria: Quantification of carotid stenosis is based on velocity parameters that correlate the residual internal carotid diameter with NASCET-based stenosis levels, using the diameter of the distal internal carotid lumen as the denominator for stenosis measurement. The following velocity measurements were obtained: RIGHT ICA:  102 cm/sec CCA:  91 cm/sec SYSTOLIC ICA/CCA RATIO:  1.1 DIASTOLIC ICA/CCA RATIO:  1 ECA:  88 cm/sec LEFT ICA:  112 cm/sec CCA:  025 cm/sec SYSTOLIC ICA/CCA RATIO:  1.1 DIASTOLIC ICA/CCA RATIO:  2.5 ECA:  129 cm/sec RIGHT CAROTID ARTERY: Little if any plaque in the bulb. Low resistance internal carotid Doppler pattern. There is minimal calcified plaque in the lower internal carotid. There is also some plaque at the origin of the external carotid artery. RIGHT VERTEBRAL ARTERY:  Antegrade. LEFT CAROTID ARTERY: Little if any plaque in the bulb. Low resistance internal carotid Doppler pattern. LEFT VERTEBRAL ARTERY:  Antegrade.  IMPRESSION: Less than 50% stenosis in the right and left internal carotid arteries. Electronically Signed   By: Marybelle Killings M.D.   On: 07/05/2016 17:27    Discharge Exam: Vitals:   07/17/16 1501 07/17/16 1522  BP:  123/66  Pulse:  79  Resp:  19  Temp: 98.4 F (36.9 C) 98.4 F (36.9 C)   Vitals:   07/17/16 0500 07/17/16 1140 07/17/16 1501 07/17/16 1522  BP: (!) 132/54   123/66  Pulse: 74   79  Resp: 20   19  Temp: 98.9 F (37.2 C) 100.3 F (37.9 C) 98.4 F (36.9 C) 98.4 F (36.9 C)  TempSrc: Oral Oral Oral Oral  SpO2: 97%   97%  Weight:      Height:        General: NAD, Alert and oriented.  Cardiovascular: RRR, S1/S2 +, no rubs, no gallops Respiratory: CTA bilaterally, no wheezing, no rhonchi Abdominal: Soft, NT, ND, bowel sounds + Extremities: no edema, no cyanosis   The results of significant diagnostics from this hospitalization (including imaging, microbiology, ancillary and laboratory) are listed below for reference.     Microbiology: Recent Results (from the past 240 hour(s))  Culture, blood (Routine x 2)     Status: None (Preliminary result)   Collection Time: 07/16/16 10:04 PM  Result Value Ref Range Status   Specimen Description BLOOD LEFT ANTECUBITAL  Final   Special Requests   Final    BOTTLES DRAWN AEROBIC AND ANAEROBIC Blood Culture adequate volume   Culture   Final    NO GROWTH 2 DAYS Performed at [facility], [address] [place], [place] [address]    Report Status PENDING  Incomplete  Culture, blood (Routine x 2)     Status: None (Preliminary result)   Collection Time: 07/16/16 10:09 PM  Result Value Ref Range Status   Specimen Description BLOOD RIGHT WRIST  Final   Special Requests   Final    BOTTLES DRAWN AEROBIC AND ANAEROBIC Blood Culture adequate volume   Culture   Final    NO GROWTH 2 DAYS Performed at [facility], [place] [address], [place], [place] [address]    Report Status PENDING  Incomplete  Urine culture      Status: Abnormal (Preliminary result)   Collection Time: 07/16/16 11:16 PM  Result Value Ref Range Status   Specimen Description URINE, CLEAN CATCH  Final   Special Requests NONE  Final   Culture (A)  Final    >=100,000 COLONIES/mL ESCHERICHIA COLI SUSCEPTIBILITIES TO FOLLOW Performed at [facility], [address], [place], [place] [address]    Report Status PENDING  Incomplete     Labs: BNP (last 3 results)  Recent Labs  07/17/16 0200  BNP 563.8*   Basic Metabolic Panel:  Recent Labs Lab 07/16/16 2204 07/17/16 0200  NA 135 136  K 3.9 3.8  CL 103 103  CO2 25 26  GLUCOSE 198* 161*  BUN 15 14  CREATININE 0.74 0.63  CALCIUM 9.0 8.8*   Liver Function Tests:  Recent Labs Lab 07/16/16 2204  AST 14*  ALT 15*  ALKPHOS 92  BILITOT 0.6  PROT 6.8  ALBUMIN 3.9   CBC:  Recent Labs Lab 07/16/16 2204 07/17/16 0200  WBC 11.5* 12.1*  NEUTROABS 10.3*  --   HGB 13.5 13.1  HCT 38.3* 37.7*  MCV 90.5 91.1  PLT 184 165   CBG:  Recent Labs Lab 07/17/16 0153 07/17/16 0805 07/17/16 1153 07/17/16 1631  GLUCAP 153* 134* 151* 155*    Urinalysis    Component Value Date/Time   COLORURINE YELLOW 07/16/2016 2204   APPEARANCEUR CLOUDY (A) 07/16/2016 2204   LABSPEC 1.017 07/16/2016 2204   PHURINE 5.0 07/16/2016 2204   GLUCOSEU 50 (A) 07/16/2016 2204   GLUCOSEU NEGATIVE 12/27/2015 1240   HGBUR LARGE (A) 07/16/2016 2204   BILIRUBINUR NEGATIVE 07/16/2016 2204   KETONESUR 5 (A) 07/16/2016 2204   PROTEINUR 30 (A) 07/16/2016 2204   UROBILINOGEN 1.0 12/27/2015 1240   NITRITE NEGATIVE 07/16/2016 2204   LEUKOCYTESUR LARGE (A) 07/16/2016 2204   Sepsis Labs Invalid input(s): PROCALCITONIN,  WBC,  LACTICIDVEN Microbiology Recent Results (from the past 240 hour(s))  Culture, blood (Routine x 2)     Status: None (Preliminary result)   Collection Time: 07/16/16 10:04 PM  Result Value Ref Range Status   Specimen Description BLOOD LEFT ANTECUBITAL  Final   Special  Requests   Final    BOTTLES DRAWN AEROBIC AND ANAEROBIC Blood Culture adequate volume   Culture   Final    NO GROWTH 2 DAYS Performed at [facility], [address] [place], [place] [address]    Report Status PENDING  Incomplete  Culture, blood (Routine x 2)     Status: None (Preliminary result)   Collection Time: 07/16/16 10:09 PM  Result Value Ref Range Status   Specimen Description BLOOD RIGHT WRIST  Final   Special Requests   Final    BOTTLES DRAWN AEROBIC AND ANAEROBIC Blood Culture adequate volume   Culture   Final    NO GROWTH 2 DAYS Performed at [facility], [place] [address], [place], [place] [address]    Report Status PENDING  Incomplete  Urine culture     Status: Abnormal (Preliminary result)   Collection Time: 07/16/16 11:16 PM  Result Value Ref Range Status   Specimen Description URINE, CLEAN CATCH  Final   Special Requests NONE  Final   Culture (A)  Final    >=100,000 COLONIES/mL ESCHERICHIA COLI SUSCEPTIBILITIES TO FOLLOW Performed at [facility], [address], [place],  [address]    Report Status PENDING  Incomplete     Time coordinating discharge: 30 minutes  SIGNED:  Chipper Oman, MD  [facility] 07/18/2016, 12:45 PM  Pager please text page via  [email or web address] Password [email or web address]

## 2016-07-19 LAB — URINE CULTURE

## 2016-07-20 ENCOUNTER — Ambulatory Visit: Payer: Medicare Other | Admitting: Neurology

## 2016-07-21 LAB — CULTURE, BLOOD (ROUTINE X 2)
Culture: NO GROWTH
Culture: NO GROWTH
SPECIAL REQUESTS: ADEQUATE
Special Requests: ADEQUATE
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
## 2016-07-27 ENCOUNTER — Ambulatory Visit (INDEPENDENT_AMBULATORY_CARE_PROVIDER_SITE_OTHER): Payer: Medicare Other | Admitting: Cardiovascular Disease

## 2016-07-27 ENCOUNTER — Encounter: Payer: Self-pay | Admitting: Cardiovascular Disease

## 2016-07-27 VITALS — BP 100/50 | HR 50 | Ht 78.0 in | Wt 224.8 lb

## 2016-07-27 DIAGNOSIS — R001 Bradycardia, unspecified: Secondary | ICD-10-CM | POA: Diagnosis not present

## 2016-07-27 DIAGNOSIS — I480 Paroxysmal atrial fibrillation: Secondary | ICD-10-CM | POA: Diagnosis not present

## 2016-07-27 DIAGNOSIS — Z7901 Long term (current) use of anticoagulants: Secondary | ICD-10-CM | POA: Diagnosis not present

## 2016-07-27 NOTE — Patient Instructions (Signed)
Medication Instructions:  Your physician has recommended you make the following change in your medication:  1. Continue to not take Aricept 2. STOP Losartan 3. STOP Diltiazem  Labwork: No new orders.  Testing/Procedures: No new orders.   Follow-Up: Your physician recommends that you schedule a follow-up appointment in: 4-6 WEEKS with Richardson Dopp PA-C  Your physician wants you to follow-up in: 1 YEAR with Dr Burt Knack.  You will receive a reminder letter in the mail two months in advance. If you don't receive a letter, please call our office to schedule the follow-up appointment.   Any Other Special Instructions Will Be Listed Below (If Applicable).  Please increase fluid intake.  Your physician has requested that you regularly monitor and record your blood pressure readings at home. Please use the same machine at the same time of day to check your readings and record them to bring to your follow-up visit. If your BP becomes elevated greater than 130/80 on a consistent basis please contact the office.   If you need a refill on your cardiac medications before your next appointment, please call your pharmacy.

## 2016-07-27 NOTE — Progress Notes (Signed)
Cardiology Office Note Date:  07/27/2016   ID:  Arshad, Oberholzer Jul 23, 1940, MRN [id number]  PCP:  [name], MD  Cardiologist:  Sherren Mocha, MD    Chief Complaint  Patient presents with  . Dizziness   History of Present Illness: Ivan Anderson is a 76 y.o. male who presents for follow-up of atrial fibrillation. The patient has been chronically anticoagulated with Eliquis. He has no significant structural heart disease with echo evidence of vigorous LV function, moderate LVH, no valvular disease, and a mildly dilated left atrium. The patient has been noted to have bradycardia. He was seen by Richardson Dopp in March and Aricept was discontinued. He returns today to follow-up and evaluate whether he should continue on his current dosage of diltiazem. The patient was hospitalized last week with confusion and was found to have a urinary tract infection felt to be related to an indwelling Foley catheter. This was removed and he was treated with IV antibiotics for urosepsis. He recovered rapidly and was discharged April 11.  The patient is here with his wife today. He continues to have problems with dizziness since discharge from the hospital. Last night he got out of bed to use the bathroom and felt very dizzy when he stood up. Most of the time his symptoms are postural. He denies frank syncope. He denies chest pain, shortness of breath, or leg swelling. He has not been drinking a lot of water.  Past Medical History:  Diagnosis Date  . Anxiety   . Atrial fibrillation, new onset ([place]) 03/27/2015  . CAD (coronary artery disease)   . Colon polyp    Cscope at [facility] aprox 2006, repeated 2010 (-), next 5 years  . Diabetes mellitus with neuropathy ([place])   . Diaphragm paralysis    R, per pt   . DJD (degenerative joint disease)   . Erectile dysfunction   . Eye muscle paralysis    congenital, lateral rectus, left  . Heart palpitations   . Hemorrhoids   . Hyperlipidemia   . Microscopic hematuria  2017   seeing urology  . Mild cognitive disorder    Unspecified mild NCD  . Skin cancer    sees derm    Past Surgical History:  Procedure Laterality Date  . ANKLE FUSION Right   . APPENDECTOMY    . CARPAL TUNNEL RELEASE     B, 2000  . CYSTOSCOPY  2017  . HIP SURGERY Right 1964   pins  . HIP SURGERY  2009   RIGHT--s/p hip replacement, s/p revision August 2009 due to dislocation x 3   . HIP SURGERY  04-2012, 2017   R hip revision  . KNEE SURGERY Left 2005  . MEDIAL PARTIAL KNEE REPLACEMENT Right 08-2012  . TOTAL HIP ARTHROPLASTY  08-09-15   [facility]    Current Outpatient Prescriptions  Medication Sig Dispense Refill  . acetaminophen (TYLENOL) 500 MG tablet Take 1,000 mg by mouth every 6 (six) hours as needed for moderate pain.     Marland Kitchen apixaban (ELIQUIS) 5 MG TABS tablet Take 5 mg by mouth 2 (two) times daily.    Marland Kitchen buPROPion (WELLBUTRIN) 75 MG tablet Take 1 tablet (75 mg total) by mouth 2 (two) times daily. 60 tablet 1  . Cinnamon 500 MG capsule Take 500 mg by mouth 2 (two) times daily.     Marland Kitchen FLUoxetine (PROZAC) 20 MG tablet Take 2 tablets (40 mg total) by mouth daily. 60 tablet 8  . insulin  glargine (LANTUS) 100 UNIT/ML injection Inject 5 Units into the skin at bedtime.    . Insulin Pen Needle 31G X 5 MM MISC Inject 15 Units into the skin daily at 10 pm. 50 each 5  . loratadine (CLARITIN) 10 MG tablet Take 10 mg by mouth every morning.     . memantine (NAMENDA) 5 MG tablet Take 5 mg by mouth 2 (two) times daily.    . metFORMIN (GLUCOPHAGE) 1000 MG tablet Take 1 tablet (1,000 mg total) by mouth 2 (two) times daily with a meal.    . Multiple Vitamin (MULTIVITAMIN) tablet Take 1 tablet by mouth every morning.     . naproxen sodium (ANAPROX) 220 MG tablet Take 220 mg by mouth daily as needed (pain).    Marland Kitchen omega-3 acid ethyl esters (LOVAZA) 1 g capsule Take 2 g by mouth 2 (two) times daily.     Marland Kitchen omeprazole (PRILOSEC) 20 MG capsule Take 1 capsule (20 mg total) by mouth daily.      . polyethylene glycol (MIRALAX / GLYCOLAX) packet Take 17 g by mouth as needed for mild constipation.    . polyvinyl alcohol (LIQUIFILM TEARS) 1.4 % ophthalmic solution Place 1 drop into both eyes 3 (three) times daily as needed for dry eyes.    . simvastatin (ZOCOR) 40 MG tablet Take 40 mg by mouth daily.     . tamsulosin (FLOMAX) 0.4 MG CAPS capsule Take 0.4 mg by mouth daily.    . vitamin B-12 (CYANOCOBALAMIN) 250 MCG tablet Take 250 mcg by mouth daily.     No current facility-administered medications for this visit.     Allergies:   Pseudoephedrine   Social History:  The patient  reports that he quit smoking about 35 years ago. He has never used smokeless tobacco. He reports that he does not drink alcohol or use drugs.   Family History:  The patient's  family history includes Dementia in his father; Diabetes in his maternal grandmother; Emphysema in his father; Heart disease in his sister; Stroke in his sister.    ROS:  Please see the history of present illness.  Otherwise, review of systems is positive for Poor appetite, dizziness, fatigue, balance problems.  All other systems are reviewed and negative.    PHYSICAL EXAM: VS:  BP (!) 100/50   Pulse (!) 50   Ht 6\' 6"  (1.981 m)   Wt 224 lb 12.8 oz (102 kg)   SpO2 97%   BMI 25.98 kg/m  , BMI Body mass index is 25.98 kg/m. GEN: Well nourished, well developed, pleasant elderly male in no acute distress  HEENT: normal  Neck: no JVD, no masses. No carotid bruits Cardiac: bradycardiac and regular without murmur or gallop                Respiratory:  clear to auscultation bilaterally, normal work of breathing GI: soft, nontender, nondistended, + BS MS: no deformity or atrophy  Ext: no pretibial edema, pedal pulses 2+= bilaterally Skin: warm and dry, no rash Neuro:  Strength and sensation are intact Psych: euthymic mood, full affect  EKG:  EKG is not ordered today.  Recent Labs: 06/16/2016: Magnesium 1.7; TSH 1.550 07/16/2016:  ALT 15 07/17/2016: B Natriuretic Peptide 117.0; BUN 14; Creatinine, Ser 0.63; Hemoglobin 13.1; Platelets 165; Potassium 3.8; Sodium 136   Lipid Panel     Component Value Date/Time   CHOL 145 07/15/2014   TRIG 39 (A) 07/15/2014   HDL 60 07/15/2014   CHOLHDL 3  10/23/2013 0833   VLDL 4.4 10/23/2013 0833   LDLCALC 77 07/15/2014      Wt Readings from Last 3 Encounters:  07/27/16 224 lb 12.8 oz (102 kg)  07/17/16 221 lb 9 oz (100.5 kg)  07/10/16 220 lb (99.8 kg)     Cardiac Studies Reviewed: 2D Echo 06-16-2016: Study Conclusions  - Left ventricle: The cavity size was normal. There was moderate   concentric hypertrophy. Systolic function was normal. The   estimated ejection fraction was in the range of 60% to 65%. Wall   motion was normal; there were no regional wall motion   abnormalities. Doppler parameters are consistent with abnormal   left ventricular relaxation (grade 1 diastolic dysfunction). The   E/e&' ratio is <8, suggesting normal LV filling pressure. - Aortic valve: Trileaflet. Sclerosis without stenosis. There was   no regurgitation. - Left atrium: The atrium was normal in size. - Right atrium: The atrium was normal in size. - Inferior vena cava: The vessel was normal in size. The   respirophasic diameter changes were in the normal range (>= 50%),   consistent with normal central venous pressure.  Impressions:  - Compared to a prior study in 2016, the LVEF is relatively stable.   There is no significant atrial enlargement or new wall motion   abnormalities.   ASSESSMENT AND PLAN: 1.  Paroxysmal atrial fibrillation: Tolerating apixaban for anticoagulation. Maintaining sinus rhythm without rhythm controlling agents. However, the patient is experiencing symptoms of postural hypotension. He may have symptomatic bradycardia. I recommended discontinuing diltiazem.  This patients CHA2DS2-VASc Score and unadjusted Ischemic Stroke Rate (% per year) is equal to 3.2 %  stroke rate/year from a score of 3  Above score calculated as 1 point each if present [CHF, HTN, DM, Vascular=MI/PAD/Aortic Plaque, Age if 65-74, or Male] Above score calculated as 2 points each if present [Age > 75, or Stroke/TIA/TE]  2. 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 Hyperlipidemia: Treated with simvastatin  4. Dizziness: The patient's blood pressure is relatively low. His symptoms seem to be postural in nature. Advised him to push fluids. He is currently taking losartan every other day. I asked him to discontinue this. He will also stop diltiazem. I asked him to monitor his blood pressure every other day and call back if readings are greater than 140/90.   Current medicines are reviewed with the patient today.  The patient does not have concerns regarding medicines.  Labs/ tests ordered today include:  No orders of the defined types were placed in this encounter.   Disposition:   FU Richardson Dopp 4-6 weeks, one year with me  Signed, Sherren Mocha, MD  07/27/2016 1:20 PM    [facility] [address], [place], [place]  [address] Phone: [phone]; Fax: [phone]

## 2016-07-31 ENCOUNTER — Encounter: Payer: Self-pay | Admitting: Neurology

## 2016-07-31 ENCOUNTER — Ambulatory Visit (INDEPENDENT_AMBULATORY_CARE_PROVIDER_SITE_OTHER): Payer: Medicare Other | Admitting: Neurology

## 2016-07-31 VITALS — BP 110/68 | HR 62 | Ht 78.0 in | Wt 226.0 lb

## 2016-07-31 DIAGNOSIS — R42 Dizziness and giddiness: Secondary | ICD-10-CM

## 2016-07-31 DIAGNOSIS — F0150 Vascular dementia without behavioral disturbance: Secondary | ICD-10-CM

## 2016-08-06 ENCOUNTER — Encounter: Payer: Self-pay | Admitting: Internal Medicine

## 2016-08-06 ENCOUNTER — Ambulatory Visit (INDEPENDENT_AMBULATORY_CARE_PROVIDER_SITE_OTHER): Payer: Medicare Other | Admitting: Internal Medicine

## 2016-08-06 VITALS — BP 118/72 | HR 77 | Temp 98.3°F | Resp 14 | Ht 78.0 in | Wt 227.5 lb

## 2016-08-06 DIAGNOSIS — I10 Essential (primary) hypertension: Secondary | ICD-10-CM

## 2016-08-06 DIAGNOSIS — R42 Dizziness and giddiness: Secondary | ICD-10-CM | POA: Diagnosis not present

## 2016-08-06 DIAGNOSIS — F0150 Vascular dementia without behavioral disturbance: Secondary | ICD-10-CM

## 2016-08-06 DIAGNOSIS — H8190 Unspecified disorder of vestibular function, unspecified ear: Secondary | ICD-10-CM

## 2016-08-06 DIAGNOSIS — E1159 Type 2 diabetes mellitus with other circulatory complications: Secondary | ICD-10-CM

## 2016-08-06 DIAGNOSIS — Z794 Long term (current) use of insulin: Secondary | ICD-10-CM | POA: Diagnosis not present

## 2016-08-06 NOTE — Progress Notes (Signed)
Pre visit review using our clinic review tool, if applicable. No additional management support is needed unless otherwise documented below in the visit note. 

## 2016-08-06 NOTE — Patient Instructions (Signed)
Call if problems or new symptoms  Next visit in 3 months   Call if blood sugars are consistently > 200

## 2016-08-06 NOTE — Progress Notes (Signed)
Subjective:    Patient ID: Ivan Anderson, male    DOB: 17-Feb-1941, 76 y.o.   MRN: [id number]  DOS:  08/06/2016 Type of visit - description : f/u Interval history:  Last visit here 06/22/2016, since then: went to the ER 3- 06-2016 with urinary retention, was sent home with a Foley Subsequently was seen by urology. Eventually admitted to hospital 07/16/2016 with mental status changes felt to be due to a UTI, CT head negative. Other problems were stable. He was recommended [facility] PT/OT. Saw cardiology and neurology . Notes  reviewed  BP Readings from Last 3 Encounters:  08/06/16 118/72  07/31/16 110/68  07/27/16 (!) 100/50    Review of Systems  Since he left the hospital he finished abx, is doing well. Denies fever, chills. No difficulty urinating, dysuria or gross hematuria No nausea or vomiting He still gets dizzy when he stands up or rolls in bed. No fall or injury.  Past Medical History:  Diagnosis Date  . Anxiety   . Atrial fibrillation, new onset ([place]) 03/27/2015  . CAD (coronary artery disease)   . Colon polyp    Cscope at [facility] aprox 2006, repeated 2010 (-), next 5 years  . Diabetes mellitus with neuropathy ([place])   . Diaphragm paralysis    R, per pt   . DJD (degenerative joint disease)   . Erectile dysfunction   . Eye muscle paralysis    congenital, lateral rectus, left  . Heart palpitations   . Hemorrhoids   . Hyperlipidemia   . Microscopic hematuria 2017   seeing urology  . Mild cognitive disorder    Unspecified mild NCD  . Skin cancer    sees derm    Past Surgical History:  Procedure Laterality Date  . ANKLE FUSION Right   . APPENDECTOMY    . CARPAL TUNNEL RELEASE     B, 2000  . CYSTOSCOPY  2017  . HIP SURGERY Right 1964   pins  . HIP SURGERY  2009   RIGHT--s/p hip replacement, s/p revision August 2009 due to dislocation x 3   . HIP SURGERY  04-2012, 2017   R hip revision  . KNEE SURGERY Left 2005  . MEDIAL PARTIAL KNEE REPLACEMENT Right 08-2012  .  TOTAL HIP ARTHROPLASTY  08-09-15   [facility]    Social History   Social History  . Marital status: Married    Spouse name: N/A  . Number of children: N/A  . 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 Smoking status: Former Smoker    Quit date: 01/02/1981  . Smokeless tobacco: Never Used     Comment: quit 1980s  . Alcohol use No  . Drug use: No  . Sexual activity: Not on file   Other Topics Concern  . Not on file   Social History Narrative  . No narrative on file      Allergies as of 08/06/2016      Reactions   Pseudoephedrine Other (See Comments)   Dx: A. Fib.       Medication List       Accurate as of 08/06/16 11:59 PM. Always use your most recent med list.          acetaminophen 500 MG tablet Commonly known as:  TYLENOL Take 1,000 mg by mouth every 6 (six) hours as needed for moderate pain.   buPROPion 75 MG tablet Commonly known as:  WELLBUTRIN Take 1 tablet (75 mg total) by mouth 2 (two) times daily.   Cinnamon 500 MG capsule Take 500 mg by mouth 2 (two) times daily.   ELIQUIS 5 MG Tabs tablet Generic drug:  apixaban Take 5 mg by mouth 2 (two) times daily.   FLUoxetine 20 MG tablet Commonly known as:  PROZAC Take 2 tablets (40 mg total) by mouth daily.   insulin glargine 100 UNIT/ML injection Commonly known as:  LANTUS Inject 5 Units into the skin at bedtime.   Insulin Pen Needle 31G X 5 MM Misc Inject 15 Units into the skin daily at 10 pm.   loratadine 10 MG tablet Commonly known as:  CLARITIN Take 10 mg by mouth every morning.   metFORMIN 1000 MG tablet Commonly known as:  GLUCOPHAGE Take 1 tablet (1,000 mg total) by mouth 2 (two) times daily with a meal.   multivitamin tablet Take 1 tablet by mouth every morning.   naproxen sodium 220 MG tablet Commonly known as:  ANAPROX Take 220 mg by mouth daily as needed (pain).   omega-3 acid ethyl esters 1 g capsule Commonly known as:   LOVAZA Take 2 g by mouth 2 (two) times daily.   omeprazole 20 MG capsule Commonly known as:  PRILOSEC Take 1 capsule (20 mg total) by mouth daily.   polyethylene glycol packet Commonly known as:  MIRALAX / GLYCOLAX Take 17 g by mouth as needed for mild constipation.   polyvinyl alcohol 1.4 % ophthalmic solution Commonly known as:  LIQUIFILM TEARS Place 1 drop into both eyes 3 (three) times daily as needed for dry eyes.   simvastatin 40 MG tablet Commonly known as:  ZOCOR Take 40 mg by mouth daily.   tamsulosin 0.4 MG Caps capsule Commonly known as:  FLOMAX Take 0.4 mg by mouth daily.   vitamin B-12 250 MCG tablet Commonly known as:  CYANOCOBALAMIN Take 250 mcg by mouth daily.          Objective:   Physical Exam BP 118/72 (BP Location: Left Arm, Patient Position: Sitting, Cuff Size: Normal)   Pulse 77   Temp 98.3 F (36.8 C) (Oral)   Resp 14   Ht 6\' 6"  (1.981 m)   Wt 227 lb 8 oz (103.2 kg)   SpO2 95%   BMI 26.29 kg/m  General:   Well developed, well nourished . NAD.  HEENT:  Normocephalic . Face symmetric, atraumatic Lungs:  CTA B Normal respiratory effort, no intercostal retractions, no accessory muscle use. Heart: RRR,  no murmur.  No pretibial edema bilaterally  Skin: Not pale. Not jaundice Neurologic:  alert & oriented X3.  Speech normal, gait assisted by a cane, slow and somewhat hesitant Psych--  Cognition and judgment appear intact.  Cooperative with normal attention span and concentration.  Behavior appropriate. No anxious or depressed appearing.      Assessment & Plan:     Assessment -- seen regularly at [name], most chronic med issues f/u there DM with neuropathy  -- [facility] Hyperlipidemia Anxiety Gait disorder, multifactorial  CV: --CAD --A. Fib, new onset 03/27/2015, Dr Burt Knack DJD Skin cancer, sees dermatology Right diaphragmatic paralysis per patient Vascular dementia   MMSE decreased from 27 to 20 on March 2016, formal  psychologically eval 12-2015. Rx not driving, family to provide medications. Intolerant to Aricept and Namenda  H/o eye muscle paralysis congenital  PLAN:  Urinary retention, UTI s/p  Admission, s/p abx , currently asx; urine culture showed Escherichia coli  sensitive to Levaquin. On Flomax. Saw Urology 07-13-16, they rec a follow-up but the patient has no schedule appointment. Will recheck on that when he comes back. A. Fibrillation: Saw cardiology 07/27/2016, was on NSR, + postural hypotension, they stopped CCBs. He remains anticoagulated. Vascular dementia: Note from neurology 4- 24-2018 reviewed, had side effects from Namenda and Aricept. Gait disturbance felt to be multifactorial including DJD, neuropathy. At this point he seems to be stable Dizziness: he continued to feel dizzy, HH PT OT not approved, we agreed of outpatient referral for vestibular rehabilitation. DM: Currently on Lantus 5 units, metformin. Has improved his diet according to the wife, CBGs range from 120- 200. Again discuss the need to avoid too tight control. Call if CBGs are consistently more than 200. HTN: Recently verapamil discontinue, ambulatory BPs range from 108 to the 120s. Last BMP satisfactory. No change rtc 3 months

## 2016-08-07 NOTE — Assessment & Plan Note (Signed)
Urinary retention, UTI s/p  Admission, s/p abx , currently asx; urine culture showed Escherichia coli sensitive to Levaquin. On Flomax. Saw Urology 07-13-16, they rec a follow-up but the patient has no schedule appointment. Will recheck on that when he comes back. A. Fibrillation: Saw cardiology 07/27/2016, was on NSR, + postural hypotension, they stopped CCBs. He remains anticoagulated. Vascular dementia: Note from neurology 4- 24-2018 reviewed, had side effects from Namenda and Aricept. Gait disturbance felt to be multifactorial including DJD, neuropathy. At this point he seems to be stable Dizziness: he continued to feel dizzy, HH PT OT not approved, we agreed of outpatient referral for vestibular rehabilitation. DM: Currently on Lantus 5 units, metformin. Has improved his diet according to the wife, CBGs range from 120- 200. Again discuss the need to avoid too tight control. Call if CBGs are consistently more than 200. HTN: Recently verapamil discontinue, ambulatory BPs range from 108 to the 120s. Last BMP satisfactory. No change rtc 3 months

## 2016-08-16 ENCOUNTER — Encounter: Payer: Self-pay | Admitting: Physician Assistant

## 2016-09-07 ENCOUNTER — Encounter: Payer: Self-pay | Admitting: Physician Assistant

## 2016-09-07 ENCOUNTER — Ambulatory Visit (INDEPENDENT_AMBULATORY_CARE_PROVIDER_SITE_OTHER): Payer: Medicare Other | Admitting: Physician Assistant

## 2016-09-07 VITALS — BP 112/58 | HR 82 | Ht 78.0 in | Wt 229.0 lb

## 2016-09-07 DIAGNOSIS — I480 Paroxysmal atrial fibrillation: Secondary | ICD-10-CM

## 2016-09-07 DIAGNOSIS — R42 Dizziness and giddiness: Secondary | ICD-10-CM | POA: Diagnosis not present

## 2016-09-07 NOTE — Patient Instructions (Signed)
Medication Instructions:  Your physician recommends that you continue on your current medications as directed. Please refer to the Current Medication list given to you today.   Labwork: NONE ORDERED  Testing/Procedures: NONE ORDERED  Follow-Up: Your physician wants you to follow-up in: [place]. You will receive a reminder letter in the mail two months in advance. If you don't receive a letter, please call our office to schedule the follow-up appointment.   Any Other Special Instructions Will Be Listed Below (If Applicable).     If you need a refill on your cardiac medications before your next appointment, please call your pharmacy.

## 2016-09-07 NOTE — Progress Notes (Signed)
Cardiology Office Note:    Date:  09/07/2016   ID:  Ivan Anderson, DOB 06/11/1940, MRN [id number]  PCP:  Colon Branch, MD  Cardiologist:  Dr. Sherren Mocha   Neurologist: Dr. Carles Collet  Referring MD: Colon Branch, MD   Chief Complaint  Patient presents with  . Follow-up    dizziness    History of Present Illness:    Ivan Anderson is a 76 y.o. male with a hx of Paroxysmal AF, diabetes, HL.  Last seen by Truitt Merle, NP in 9/17.     He was admitted 06/2016 with symptomatic bradycardia with symptoms of weakness and dizziness and a heart rate of 40. There was a question of whether or not he took too much diltiazem the day before. His diltiazem was discontinued and he was evaluated by Dr. Debara Pickett for cardiology. Repeat echocardiogram demonstrated normal LV function. On review of telemetry that is available in his chart, his heart rate remained 40s-50s. There were no significant pauses. He saw primary care on 06/22/16. Notes indicate he was back on Cardizem. His heart rate was in the 60s. Aricept was stopped and he was asked to follow-up with neurology.  I saw him in 06/2016 for follow up.  I recommended monitoring him off of Aricept to see how his HR responded.  He was then admitted in 07/2016 with urinary retention and sepsis.  He then saw Dr. Sherren Mocha 07/27/16.  His Diltiazem was DC'd 2/2 orthostatic hypotension and possibly symptomatic bradycardia.  His Losartan was also DC'd.    CHADS2-VASc=3 (76 yo, DM).   [name] returns for Cardiology follow up.  He is here with his wife.  He is feeling much better.  He denies any further weakness. He denies chest pain, shortness of breath, syncope, orthopnea, PND or significant pedal edema.  He denies any bleeding issues.    Prior CV studies:   The following studies were reviewed today:  Carotid US 06/2016 <50% bilateral ICA  Echo 06/16/16 Moderate concentric LVH, EF 60-65, normal wall motion, grade 1 diastolic dysfunction, aortic sclerosis without  stenosis   Echo 04/08/15 Moderate LVH, vigorous LVF, EF 65-70, normal wall motion, grade 1 diastolic dysfunction, mild LAE, mild TR   Myoview 5/13 No ischemia  Past Medical History:  Diagnosis Date  . Anxiety   . Atrial fibrillation, new onset (Ivan Anderson) 03/27/2015  . CAD (coronary artery disease)   . Colon polyp    Cscope at [facility] aprox 2006, repeated 2010 (-), next 5 years  . Diabetes mellitus with neuropathy ([place])   . Diaphragm paralysis    R, per pt   . DJD (degenerative joint disease)   . Erectile dysfunction   . Eye muscle paralysis    congenital, lateral rectus, left  . Heart palpitations   . Hemorrhoids   . Hyperlipidemia   . Microscopic hematuria 2017   seeing urology  . Mild cognitive disorder    Unspecified mild NCD  . Skin cancer    sees derm    Current Medications: Current Meds  Medication Sig  . acetaminophen (TYLENOL) 500 MG tablet Take 1,000 mg by mouth every 6 (six) hours as needed for moderate pain.   Marland Kitchen apixaban (ELIQUIS) 5 MG TABS tablet Take 5 mg by mouth 2 (two) times daily.  Marland Kitchen buPROPion (WELLBUTRIN) 75 MG tablet Take 1 tablet (75 mg total) by mouth 2 (two) times daily.  . Cinnamon 500 MG capsule Take 500 mg by mouth 2 (two) times daily.   Marland Kitchen  FLUoxetine (PROZAC) 20 MG tablet Take 2 tablets (40 mg total) by mouth daily.  . insulin glargine (LANTUS) 100 UNIT/ML injection Inject 5 Units into the skin at bedtime.  . Insulin Pen Needle 31G X 5 MM MISC Inject 15 Units into the skin daily at 10 pm.  . loratadine (CLARITIN) 10 MG tablet Take 10 mg by mouth every morning.   . metFORMIN (GLUCOPHAGE) 1000 MG tablet Take 1 tablet (1,000 mg total) by mouth 2 (two) times daily with a meal.  . Multiple Vitamin (MULTIVITAMIN) tablet Take 1 tablet by mouth every morning.   . naproxen sodium (ANAPROX) 220 MG tablet Take 220 mg by mouth daily as needed (pain).  Marland Kitchen omega-3 acid ethyl esters (LOVAZA) 1 g capsule Take 2 g by mouth 2 (two) times daily.   Marland Kitchen omeprazole (PRILOSEC) 20  MG capsule Take 1 capsule (20 mg total) by mouth daily.  . polyethylene glycol (MIRALAX / GLYCOLAX) packet Take 17 g by mouth as needed for mild constipation.  . polyvinyl alcohol (LIQUIFILM TEARS) 1.4 % ophthalmic solution Place 1 drop into both eyes 3 (three) times daily as needed for dry eyes.  . simvastatin (ZOCOR) 40 MG tablet Take 40 mg by mouth daily.   . tamsulosin (FLOMAX) 0.4 MG CAPS capsule Take 0.4 mg by mouth daily.  . vitamin B-12 (CYANOCOBALAMIN) 250 MCG tablet Take 250 mcg by mouth daily.     Allergies:   Pseudoephedrine   Social History   Social History  . Marital status: Married    Spouse name: N/A  . Number of children: N/A  . 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 Smoking status: Former Smoker    Quit date: 01/02/1981  . Smokeless tobacco: Never Used     Comment: quit 1980s  . Alcohol use No  . Drug use: No  . Sexual activity: Not Asked   Other Topics Concern  . None   Social History Narrative  . None     Family Hx: The patient's family history includes Dementia in his father; Diabetes in his maternal grandmother; Emphysema in his father; Heart disease in his sister; Stroke in his sister. There is no history of Heart attack or Hypertension.  ROS:   Please see the history of present illness.    ROS All other systems reviewed and are negative.   EKGs/Labs/Other Test Reviewed:    EKG:  EKG is not ordered today.  The ekg ordered today demonstrates n/a  Recent Labs: 06/16/2016: Magnesium 1.7; TSH 1.550 07/16/2016: ALT 15 07/17/2016: B Natriuretic Peptide 117.0; BUN 14; Creatinine, Ser 0.63; Hemoglobin 13.1; Platelets 165; Potassium 3.8; Sodium 136   Recent Lipid Panel Lab Results  Component Value Date/Time   CHOL 145 07/15/2014   TRIG 39 (A) 07/15/2014   HDL 60 07/15/2014   CHOLHDL 3 10/23/2013 08:33 AM   LDLCALC 77 07/15/2014    Physical Exam:    VS:  BP (!) 112/58   Pulse 82   Ht 6\' 6"   (1.981 m)   Wt 229 lb (103.9 kg)   SpO2 97%   BMI 26.46 kg/m     Wt Readings from Last 3 Encounters:  09/07/16 229 lb (103.9 kg)  08/06/16 227 lb 8 oz (103.2 kg)  07/31/16 226 lb (102.5 kg)     Physical Exam  Constitutional: He is oriented to person, place, and time. He appears well-developed and well-nourished. No distress.  HENT:  Head: Normocephalic and atraumatic.  Eyes: No scleral icterus.  Neck: Normal range of motion. No JVD present.  Cardiovascular: Normal rate, regular rhythm, S1 normal and S2 normal.   No murmur heard. Pulmonary/Chest: Effort normal and breath sounds normal. He has no wheezes. He has no rhonchi. He has no rales.  Abdominal: Soft. There is no tenderness.  Musculoskeletal: He exhibits no edema.  Neurological: He is alert and oriented to person, place, and time.  Skin: Skin is warm and dry.  Psychiatric: He has a normal mood and affect.    ASSESSMENT:    1. Dizziness   2. Paroxysmal atrial fibrillation (HCC)    PLAN:    In order of problems listed above:  1. Dizziness - Combination of orthostatic hypotension and symptomatic bradycardia.  He is feeling much better now off of calcium channel blocker and angiotensin receptor blocker.  Continue current therapy.   2. Paroxysmal atrial fibrillation (HCC) - Maintaining NSR by exam.  Continue anticoagulation with Apixaban.  He has not had recurrent symptoms of atrial fibrillation since stopping Diltiazem.     Dispo:  Return in about 1 year (around 09/07/2017) for Routine Follow Up, w/ Dr. Burt Knack.   Medication Adjustments/Labs and Tests Ordered: Current medicines are reviewed at length with the patient today.  Concerns regarding medicines are outlined above.  Orders/Tests:  No orders of the defined types were placed in this encounter.  Medication changes: No orders of the defined types were placed in this encounter.  Signed, Richardson Dopp, PA-C  09/07/2016 10:01 AM    [facility]  [facility], [place], [place]  [address] Phone: [phone]; Fax: [phone]

## 2016-09-13 ENCOUNTER — Encounter: Payer: Self-pay | Admitting: Internal Medicine

## 2016-10-04 ENCOUNTER — Encounter: Payer: Self-pay | Admitting: Family Medicine

## 2016-10-04 ENCOUNTER — Ambulatory Visit (INDEPENDENT_AMBULATORY_CARE_PROVIDER_SITE_OTHER): Payer: Medicare Other | Admitting: Family Medicine

## 2016-10-04 VITALS — BP 118/60 | HR 65 | Temp 98.5°F | Ht 78.0 in

## 2016-10-04 DIAGNOSIS — I951 Orthostatic hypotension: Secondary | ICD-10-CM

## 2016-10-04 DIAGNOSIS — H8113 Benign paroxysmal vertigo, bilateral: Secondary | ICD-10-CM | POA: Diagnosis not present

## 2016-10-04 NOTE — Progress Notes (Signed)
Chief Complaint  Patient presents with  . Dizziness    pt states it has been going on for awhile    Ivan Anderson is 76 y.o. pt here for dizziness.  Duration: This AM Pass out? No Spinning? Yes- when he moves his head, lasts for a few seconds then resolves; also feels light-headed when he stands up Recent illness/fever? No Headache? No Neurologic signs? No Change in PO intake? Yes- poor intake of water- dx'd w orthostatic hypotension by cardiology  ROS:  Neuro: As noted in HPI Eyes: No vision changes  Past Medical History:  Diagnosis Date  . Anxiety   . Atrial fibrillation, new onset ([place]) 03/27/2015  . CAD (coronary artery disease)   . Colon polyp    Cscope at [facility] aprox 2006, repeated 2010 (-), next 5 years  . Diabetes mellitus with neuropathy ([place])   . Diaphragm paralysis    R, per pt   . DJD (degenerative joint disease)   . Erectile dysfunction   . Eye muscle paralysis    congenital, lateral rectus, left  . Heart palpitations   . Hemorrhoids   . Hyperlipidemia   . Microscopic hematuria 2017   seeing urology  . Mild cognitive disorder    Unspecified mild NCD  . Skin cancer    sees derm    Family History  Problem Relation Age of Onset  . Dementia Father   . Emphysema Father   . Diabetes Maternal Grandmother   . Heart disease Sister   . Stroke Sister   . Heart attack Neg Hx   . Hypertension Neg Hx     Allergies as of 10/04/2016      Reactions   Pseudoephedrine Other (See Comments)   Dx: A. Fib.       Medication List       Accurate as of 10/04/16  4:33 PM. Always use your most recent med list.          acetaminophen 500 MG tablet Commonly known as:  TYLENOL Take 1,000 mg by mouth every 6 (six) hours as needed for moderate pain.   buPROPion 75 MG tablet Commonly known as:  WELLBUTRIN Take 1 tablet (75 mg total) by mouth 2 (two) times daily.   Cinnamon 500 MG capsule Take 500 mg by mouth 2 (two) times daily.   ELIQUIS 5 MG Tabs tablet Generic  drug:  apixaban Take 5 mg by mouth 2 (two) times daily.   FLUoxetine 20 MG tablet Commonly known as:  PROZAC Take 2 tablets (40 mg total) by mouth daily.   insulin glargine 100 UNIT/ML injection Commonly known as:  LANTUS Inject 5 Units into the skin at bedtime.   Insulin Pen Needle 31G X 5 MM Misc Inject 15 Units into the skin daily at 10 pm.   loratadine 10 MG tablet Commonly known as:  CLARITIN Take 10 mg by mouth every morning.   metFORMIN 1000 MG tablet Commonly known as:  GLUCOPHAGE Take 1 tablet (1,000 mg total) by mouth 2 (two) times daily with a meal.   multivitamin tablet Take 1 tablet by mouth every morning.   naproxen sodium 220 MG tablet Commonly known as:  ANAPROX Take 220 mg by mouth daily as needed (pain).   omega-3 acid ethyl esters 1 g capsule Commonly known as:  LOVAZA Take 2 g by mouth 2 (two) times daily.   omeprazole 20 MG capsule Commonly known as:  PRILOSEC Take 1 capsule (20 mg total) by mouth daily.  polyethylene glycol packet Commonly known as:  MIRALAX / GLYCOLAX Take 17 g by mouth as needed for mild constipation.   polyvinyl alcohol 1.4 % ophthalmic solution Commonly known as:  LIQUIFILM TEARS Place 1 drop into both eyes 3 (three) times daily as needed for dry eyes.   simvastatin 40 MG tablet Commonly known as:  ZOCOR Take 40 mg by mouth daily.   tamsulosin 0.4 MG Caps capsule Commonly known as:  FLOMAX Take 0.4 mg by mouth daily.   vitamin B-12 250 MCG tablet Commonly known as:  CYANOCOBALAMIN Take 250 mcg by mouth daily.       BP 118/60 (BP Location: Left Arm, Patient Position: Sitting, Cuff Size: Normal)   Pulse 65   Temp 98.5 F (36.9 C) (Oral)   Ht 6\' 6"  (1.981 m)   SpO2 97%  General: Awake, alert, appears stated age Eyes: PERRLA, EOMi Ears: Patent, TM's neg b/l Heart: RRR, no murmurs, no carotid bruits Lungs: CTAB, no accessory muscle use MSK: 5/5 strength throughout, gait normal Neuro: No cerebellar signs,  patellar reflex 0/4 b/l wo clonus, calcaneal reflex 1/4 b/l wo clonus, biceps reflex 1/4 b/l wo clonus; Dix-Hall-Pike positive b/l. Psych: Limited judgment and insight  Orthostasis  Benign paroxysmal positional vertigo due to bilateral vestibular disorder  Needs to drink fluids. Biggest component is orthostatic hypotension as noted from cardiology and reg PCP.  Component of BPPV, will give Epley maneuvers, if he refuses to do as brought up by wife, will refer to vestibular rehab. I will hold off on antivert given concern for falls from orthostasis. May consider changing Flomax to Finasteride, but will defer to reg PCP.  F/u prn. Pt, his wife and daughter voiced understanding and agreement to the plan.  [place], DO 10/04/16 4:33 PM

## 2016-10-04 NOTE — Patient Instructions (Addendum)
Stay very well hydrated with water or low calorie sports drinks.   The Epley maneuver is demonstrated on Youtube well.   How to Perform the Epley Maneuver The Epley maneuver is an exercise that relieves symptoms of vertigo. Vertigo is the feeling that you or your surroundings are moving when they are not. When you feel vertigo, you may feel like the room is spinning and have trouble walking. Dizziness is a little different than vertigo. When you are dizzy, you may feel unsteady or light-headed. You can do this maneuver at home whenever you have symptoms of vertigo. You can do it up to 3 times a day until your symptoms go away. Even though the Epley maneuver may relieve your vertigo for a few weeks, it is possible that your symptoms will return. This maneuver relieves vertigo, but it does not relieve dizziness. What are the risks? If it is done correctly, the Epley maneuver is considered safe. Sometimes it can lead to dizziness or nausea that goes away after a short time. If you develop other symptoms, such as changes in vision, weakness, or numbness, stop doing the maneuver and call your health care provider. How to perform the Epley maneuver 1. Sit on the edge of a bed or table with your back straight and your legs extended or hanging over the edge of the bed or table. 2. Turn your head halfway toward the affected ear or side. 3. Lie backward quickly with your head turned until you are lying flat on your back. You may want to position a pillow under your shoulders. 4. Hold this position for 30 seconds. You may experience an attack of vertigo. This is normal. 5. Turn your head to the opposite direction until your unaffected ear is facing the floor. 6. Hold this position for 30 seconds. You may experience an attack of vertigo. This is normal. Hold this position until the vertigo stops. 7. Turn your whole body to the same side as your head. Hold for another 30 seconds. 8. Sit back up. You can repeat  this exercise up to 3 times a day. Follow these instructions at home:  After doing the Epley maneuver, you can return to your normal activities.  Ask your health care provider if there is anything you should do at home to prevent vertigo. He or she may recommend that you: ? Keep your head raised (elevated) with two or more pillows while you sleep. ? Do not sleep on the side of your affected ear. ? Get up slowly from bed. ? Avoid sudden movements during the day. ? Avoid extreme head movement, like looking up or bending over. Contact a health care provider if:  Your vertigo gets worse.  You have other symptoms, including: ? Nausea. ? Vomiting. ? Headache. Get help right away if:  You have vision changes.  You have a severe or worsening headache or neck pain.  You cannot stop vomiting.  You have new numbness or weakness in any part of your body. Summary  Vertigo is the feeling that you or your surroundings are moving when they are not.  The Epley maneuver is an exercise that relieves symptoms of vertigo.  If the Epley maneuver is done correctly, it is considered safe. You can do it up to 3 times a day. This information is not intended to replace advice given to you by your health care provider. Make sure you discuss any questions you have with your health care provider. Document Released: 03/31/2013 Document Revised:  02/14/2016 Document Reviewed: 02/14/2016 Elsevier Interactive Patient Education  2017 [employer].

## 2016-10-09 ENCOUNTER — Ambulatory Visit (INDEPENDENT_AMBULATORY_CARE_PROVIDER_SITE_OTHER): Payer: Medicare Other | Admitting: Internal Medicine

## 2016-10-09 ENCOUNTER — Encounter: Payer: Self-pay | Admitting: Internal Medicine

## 2016-10-09 VITALS — BP 132/80 | HR 58 | Temp 98.6°F | Resp 14 | Ht 78.0 in | Wt 233.1 lb

## 2016-10-09 DIAGNOSIS — Z794 Long term (current) use of insulin: Secondary | ICD-10-CM | POA: Diagnosis not present

## 2016-10-09 DIAGNOSIS — F0280 Dementia in other diseases classified elsewhere without behavioral disturbance: Secondary | ICD-10-CM

## 2016-10-09 DIAGNOSIS — E1159 Type 2 diabetes mellitus with other circulatory complications: Secondary | ICD-10-CM

## 2016-10-09 DIAGNOSIS — G309 Alzheimer's disease, unspecified: Secondary | ICD-10-CM | POA: Diagnosis not present

## 2016-10-09 MED ORDER — APIXABAN 5 MG PO TABS: 5.0000 mg | ORAL_TABLET | Freq: Two times a day (BID) | ORAL | 1 refills | Status: DC

## 2016-10-09 NOTE — Progress Notes (Signed)
Subjective:    Patient ID: Ivan Anderson, male    DOB: May 03, 1940, 76 y.o.   MRN: [id number]  DOS:  10/09/2016 Type of visit - description : rov Interval history: Was recently seen with dizziness, he increase his fluid intake and feels better. Medication compliance: Very good Labs reviewed, not due for any specific lab today.   Review of Systems Memory is still poor and getting slightly worse according to the wife. No behavioral issues except at night : he seems slightly "paranoid" (per wife), checks  outside of the house a few times. No wandering.  Past Medical History:  Diagnosis Date  . Anxiety   . Atrial fibrillation, new onset ([place]) 03/27/2015  . CAD (coronary artery disease)   . Colon polyp    Cscope at [facility] aprox 2006, repeated 2010 (-), next 5 years  . Diabetes mellitus with neuropathy ([place])   . Diaphragm paralysis    R, per pt   . DJD (degenerative joint disease)   . Erectile dysfunction   . Eye muscle paralysis    congenital, lateral rectus, left  . Heart palpitations   . Hemorrhoids   . Hyperlipidemia   . Microscopic hematuria 2017   seeing urology  . Mild cognitive disorder    Unspecified mild NCD  . Skin cancer    sees derm    Past Surgical History:  Procedure Laterality Date  . ANKLE FUSION Right   . APPENDECTOMY    . CARPAL TUNNEL RELEASE     B, 2000  . CYSTOSCOPY  2017  . HIP SURGERY Right 1964   pins  . HIP SURGERY  2009   RIGHT--s/p hip replacement, s/p revision August 2009 due to dislocation x 3   . HIP SURGERY  04-2012, 2017   R hip revision  . KNEE SURGERY Left 2005  . MEDIAL PARTIAL KNEE REPLACEMENT Right 08-2012  . TOTAL HIP ARTHROPLASTY  08-09-15   [facility]    Social History   Social History  . Marital status: Married    Spouse name: N/A  . Number of children: N/A  . 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 Smoking status: Former Smoker    Quit date: 01/02/1981  .  Smokeless tobacco: Never Used     Comment: quit 1980s  . Alcohol use No  . Drug use: No  . Sexual activity: Not on file   Other Topics Concern  . Not on file   Social History Narrative  . No narrative on file      Allergies as of 10/09/2016      Reactions   Pseudoephedrine Other (See Comments)   Dx: A. Fib.       Medication List       Accurate as of 10/09/16 11:59 PM. Always use your most recent med list.          acetaminophen 500 MG tablet Commonly known as:  TYLENOL Take 1,000 mg by mouth every 6 (six) hours as needed for moderate pain.   apixaban 5 MG Tabs tablet Commonly known as:  ELIQUIS Take 1 tablet (5 mg total) by mouth 2 (two) times daily.   buPROPion 75 MG tablet Commonly known as:  WELLBUTRIN Take 1 tablet (75 mg total) by mouth 2 (two) times daily.   Cinnamon 500 MG capsule Take 500 mg by mouth 2 (two) times daily.   FLUoxetine 20 MG tablet Commonly known  as:  PROZAC Take 2 tablets (40 mg total) by mouth daily.   insulin glargine 100 UNIT/ML injection Commonly known as:  LANTUS Inject 5 Units into the skin at bedtime.   Insulin Pen Needle 31G X 5 MM Misc Inject 15 Units into the skin daily at 10 pm.   loratadine 10 MG tablet Commonly known as:  CLARITIN Take 10 mg by mouth every morning.   metFORMIN 1000 MG tablet Commonly known as:  GLUCOPHAGE Take 1 tablet (1,000 mg total) by mouth 2 (two) times daily with a meal.   multivitamin tablet Take 1 tablet by mouth every morning.   naproxen sodium 220 MG tablet Commonly known as:  ANAPROX Take 220 mg by mouth daily as needed (pain).   omega-3 acid ethyl esters 1 g capsule Commonly known as:  LOVAZA Take 2 g by mouth 2 (two) times daily.   omeprazole 20 MG capsule Commonly known as:  PRILOSEC Take 1 capsule (20 mg total) by mouth daily.   polyethylene glycol packet Commonly known as:  MIRALAX / GLYCOLAX Take 17 g by mouth as needed for mild constipation.   polyvinyl alcohol 1.4 %  ophthalmic solution Commonly known as:  LIQUIFILM TEARS Place 1 drop into both eyes 3 (three) times daily as needed for dry eyes.   simvastatin 40 MG tablet Commonly known as:  ZOCOR Take 40 mg by mouth daily.   tamsulosin 0.4 MG Caps capsule Commonly known as:  FLOMAX Take 0.4 mg by mouth daily.   vitamin B-12 250 MCG tablet Commonly known as:  CYANOCOBALAMIN Take 250 mcg by mouth daily.          Objective:   Physical Exam BP 132/80 (BP Location: Left Arm, Patient Position: Sitting, Cuff Size: Normal)   Pulse (!) 58   Temp 98.6 F (37 C) (Oral)   Resp 14   Ht 6\' 6"  (1.981 m)   Wt 233 lb 2 oz (105.7 kg)   SpO2 97%   BMI 26.94 kg/m  General:   Well developed, well nourished . NAD.  HEENT:  Normocephalic . Face symmetric, atraumatic Lungs:  CTA B Normal respiratory effort, no intercostal retractions, no accessory muscle use. Heart: RRR,  no murmur.  No pretibial edema bilaterally  Skin: Not pale. Not jaundice Neurologic:  alert & oriented X3.  Speech normal, gait assisted by a cane, slow and somewhat hesitant Psych--  Cognition and judgment appear intact.  Cooperative with normal attention span and concentration.  Behavior appropriate. No anxious or depressed appearing.     Assessment & Plan:   Assessment -- seen regularly at [name], most chronic medical  issues f/u there DM with neuropathy  -- [facility] Hyperlipidemia Anxiety Gait disorder, multifactorial  CV: --CAD --A. 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 Rx not driving, family to provide medications. Intolerant to Aricept and Namenda  H/o eye muscle paralysis congenital  PLAN:   DM: Follow-up at [name], reports last A1c 7.2. The patient has occ  dietary indiscretions. Watch sugars, if he likes to enjoy certain foods think  that is okay as long as his insulin dose is adjusted. Dementia:  no behavioral issues except some paranoid behavior at night. Rec wife to monitor closely. He is currently not working and not driving. Praised  Primary care and chronic issues are mostly monitory by  [name]. RTC 8 months, call if acute problems.

## 2016-10-09 NOTE — Progress Notes (Signed)
Pre visit review using our clinic review tool, if applicable. No additional management support is needed unless otherwise documented below in the visit note. 

## 2016-10-09 NOTE — Patient Instructions (Signed)
  GO TO THE FRONT DESK Schedule your next appointment for a  Check up in 8 months   

## 2016-10-10 NOTE — Assessment & Plan Note (Signed)
DM: Follow-up at [name], reports last A1c 7.2. The patient has occ  dietary indiscretions. Watch sugars, if he likes to enjoy certain foods think that is okay as long as his insulin dose is adjusted. Dementia:  no behavioral issues except some paranoid behavior at night. Rec wife to monitor closely. He is currently not working and not driving. Praised  Primary care and chronic issues are mostly monitory by [name]. RTC 8 months, call if acute problems.

## 2016-11-01 ENCOUNTER — Telehealth: Payer: Self-pay | Admitting: Internal Medicine

## 2016-11-01 NOTE — Telephone Encounter (Signed)
Pt dropped off a Voided Rx (yellow sheet) and a letter from [place] indicating instructions for provider to do and sent by fax to the [facility] (fax # [phone]). Document 2 pages put at the front office tray.

## 2016-11-02 NOTE — Telephone Encounter (Signed)
Printed last [place] copy and attached to form; unsure if new or reprinted Rx for Eliquis 5 mg is needed, last written on 10/09/16 for #[id number] [void copy attached]; forwarded to provider/SLS 07/27

## 2016-11-07 MED ORDER — APIXABAN 5 MG PO TABS
5.0000 mg | ORAL_TABLET | Freq: Two times a day (BID) | ORAL | 1 refills | Status: AC
Start: 2016-11-07 — End: ?

## 2016-11-07 NOTE — Addendum Note (Signed)
Addended byDamita Dunnings D on: 11/07/2016 03:19 PM   Modules accepted: Orders

## 2016-11-07 NOTE — Telephone Encounter (Signed)
Eliquis Rx re-printed and faxed to [place] at [phone] w/ OV notes from 10/09/2016.

## 2016-12-17 ENCOUNTER — Emergency Department (HOSPITAL_COMMUNITY): Payer: Medicare Other

## 2016-12-17 ENCOUNTER — Encounter (HOSPITAL_COMMUNITY): Payer: Self-pay | Admitting: Emergency Medicine

## 2016-12-17 ENCOUNTER — Emergency Department (HOSPITAL_COMMUNITY)
Admission: EM | Admit: 2016-12-17 | Discharge: 2016-12-17 | Disposition: A | Discharge status: Home / Self Care | Payer: Medicare Other | Attending: Emergency Medicine | Admitting: Emergency Medicine

## 2016-12-17 DIAGNOSIS — Z7901 Long term (current) use of anticoagulants: Secondary | ICD-10-CM | POA: Insufficient documentation

## 2016-12-17 DIAGNOSIS — I110 Hypertensive heart disease with heart failure: Secondary | ICD-10-CM | POA: Diagnosis not present

## 2016-12-17 DIAGNOSIS — R0789 Other chest pain: Secondary | ICD-10-CM | POA: Diagnosis not present

## 2016-12-17 DIAGNOSIS — E119 Type 2 diabetes mellitus without complications: Secondary | ICD-10-CM | POA: Diagnosis not present

## 2016-12-17 DIAGNOSIS — Z96651 Presence of right artificial knee joint: Secondary | ICD-10-CM | POA: Insufficient documentation

## 2016-12-17 DIAGNOSIS — Z96641 Presence of right artificial hip joint: Secondary | ICD-10-CM | POA: Insufficient documentation

## 2016-12-17 DIAGNOSIS — Z85828 Personal history of other malignant neoplasm of skin: Secondary | ICD-10-CM | POA: Insufficient documentation

## 2016-12-17 DIAGNOSIS — Z87891 Personal history of nicotine dependence: Secondary | ICD-10-CM | POA: Diagnosis not present

## 2016-12-17 DIAGNOSIS — R350 Frequency of micturition: Secondary | ICD-10-CM | POA: Insufficient documentation

## 2016-12-17 DIAGNOSIS — E860 Dehydration: Secondary | ICD-10-CM

## 2016-12-17 DIAGNOSIS — Z79899 Other long term (current) drug therapy: Secondary | ICD-10-CM | POA: Insufficient documentation

## 2016-12-17 DIAGNOSIS — I2510 Atherosclerotic heart disease of native coronary artery without angina pectoris: Secondary | ICD-10-CM | POA: Insufficient documentation

## 2016-12-17 DIAGNOSIS — H81399 Other peripheral vertigo, unspecified ear: Secondary | ICD-10-CM | POA: Insufficient documentation

## 2016-12-17 DIAGNOSIS — I5032 Chronic diastolic (congestive) heart failure: Secondary | ICD-10-CM | POA: Diagnosis not present

## 2016-12-17 DIAGNOSIS — Z794 Long term (current) use of insulin: Secondary | ICD-10-CM | POA: Diagnosis not present

## 2016-12-17 DIAGNOSIS — R42 Dizziness and giddiness: Secondary | ICD-10-CM | POA: Diagnosis not present

## 2016-12-17 LAB — URINALYSIS, ROUTINE W REFLEX MICROSCOPIC
BACTERIA UA: NONE SEEN
Bilirubin Urine: NEGATIVE
Glucose, UA: >=500 mg/dL — AB
Hgb urine dipstick: NEGATIVE
KETONES UR: NEGATIVE mg/dL
Leukocytes, UA: NEGATIVE
Nitrite: NEGATIVE
PROTEIN: NEGATIVE mg/dL
RBC / HPF: NONE SEEN RBC/hpf (ref 0–5)
SQUAMOUS EPITHELIAL / LPF: NONE SEEN
Specific Gravity, Urine: 1 — ABNORMAL LOW (ref 1.005–1.030)
WBC UA: NONE SEEN WBC/hpf (ref 0–5)
pH: 6 (ref 5.0–8.0)

## 2016-12-17 LAB — BASIC METABOLIC PANEL
ANION GAP: 9 (ref 5–15)
BUN: 15 mg/dL (ref 6–20)
CHLORIDE: 101 mmol/L (ref 101–111)
CO2: 25 mmol/L (ref 22–32)
CREATININE: 0.59 mg/dL — AB (ref 0.61–1.24)
Calcium: 9.1 mg/dL (ref 8.9–10.3)
GFR calc non Af Amer: >60 mL/min (ref >60)
Glucose, Bld: 137 mg/dL — ABNORMAL HIGH (ref 65–99)
POTASSIUM: 4.1 mmol/L (ref 3.5–5.1)
Sodium: 135 mmol/L (ref 135–145)

## 2016-12-17 LAB — CBC
HEMATOCRIT: 39.9 % (ref 39.0–52.0)
HEMOGLOBIN: 14 g/dL (ref 13.0–17.0)
MCH: 31 pg (ref 26.0–34.0)
MCHC: 35.1 g/dL (ref 30.0–36.0)
MCV: 88.3 fL (ref 78.0–100.0)
Platelets: 197 10*3/uL (ref 150–400)
RBC: 4.52 MIL/uL (ref 4.22–5.81)
RDW: 13 % (ref 11.5–15.5)
WBC: 7 10*3/uL (ref 4.0–10.5)

## 2016-12-17 LAB — CBG MONITORING, ED: Glucose-Capillary: 194 mg/dL — ABNORMAL HIGH (ref 65–99)

## 2016-12-17 LAB — I-STAT TROPONIN, ED: Troponin i, poc: 0 ng/mL (ref 0.00–0.08)

## 2016-12-17 MED ORDER — LORAZEPAM 2 MG/ML IJ SOLN
1.0000 mg | Freq: Once | INTRAMUSCULAR | Status: AC | PRN
  Administered 2016-12-17: 1 mg via INTRAVENOUS
  Filled 2016-12-17: qty 1

## 2016-12-17 MED ORDER — MECLIZINE HCL 12.5 MG PO TABS: 12.5000 mg | ORAL_TABLET | Freq: Two times a day (BID) | ORAL | 0 refills | Status: DC | PRN

## 2016-12-17 MED ORDER — SODIUM CHLORIDE 0.9 % IV BOLUS (SEPSIS)
500.0000 mL | Freq: Once | INTRAVENOUS | Status: AC
  Administered 2016-12-17: 500 mL via INTRAVENOUS

## 2016-12-17 NOTE — Discharge Instructions (Signed)
Please continue to drink plenty of water and Gatorade. Please try to drink at least 6-8 glasses of 8 ounces of liquids. Drinking soda can actually cause you to be more dehydrated.  If you continue to have more frequent urination, please follow-up with your primary care provider to discuss these symptoms.  You may take 1 tablet of meclizine up to 2 times per day if you feel dizzy, as if the room is spinning around.  If you have new or worsening symptoms, such as weakness or falling, please return to the emergency department for reevaluation.

## 2016-12-17 NOTE — ED Provider Notes (Signed)
[facility] DEPT Provider Note   CSN: [id number] Arrival date & time: 12/17/16  1001     History   Chief Complaint Chief Complaint  Patient presents with  . Dizziness    HPI Ivan Anderson is a 76 y.o. male with a h/o of DM, Afib on Eliquis, CHF, and dementia who presents to the emergency department with a chief complaint of dizziness with lightheadedness, dysequilibrium, and urinary frequency. He denies fever, chills, dysuria, chest pain, dyspnea, abdominal pain, or N/V/D.  He reports the dizziness, which he describes as the room spinning around, and lightheadedness began last night when he as going to bed. He reports his symptoms worsened with positional changes.  He states that he slept poorly last night because was up and down voiding all night. He reports more frequent urination over the last 2 weeks.   He reports that his dizziness and lightheadedness resolved at 8 AM this morning. The patient's daughter reports that the patient has ambulated at baseline with a cane since he was d/ced from the hospital in 04/18, but this morning, he has had to use his rolling walker because she states that he seemed more weak.  The history is provided by the patient. No language interpreter was used.    Past Medical History:  Diagnosis Date  . Anxiety   . Atrial fibrillation, new onset ([place]) 03/27/2015  . CAD (coronary artery disease)   . Colon polyp    Cscope at [facility] aprox 2006, repeated 2010 (-), next 5 years  . Diabetes mellitus with neuropathy ([place])   . Diaphragm paralysis    R, per pt   . DJD (degenerative joint disease)   . Erectile dysfunction   . Eye muscle paralysis    congenital, lateral rectus, left  . Heart palpitations   . Hemorrhoids   . Hyperlipidemia   . Microscopic hematuria 2017   seeing urology  . Mild cognitive disorder    Unspecified mild NCD  . Skin cancer    sees derm    Patient Active Problem List   Diagnosis Date Noted  . UTI (urinary tract infection)  07/17/2016  . Acute encephalopathy 07/17/2016  . HLD (hyperlipidemia) 07/17/2016  . GERD (gastroesophageal reflux disease) 07/17/2016  . Depression 07/17/2016  . Chronic diastolic CHF (congestive heart failure) ([place]) 07/17/2016  . Dementia 06/15/2016  . Bradycardia 06/15/2016  . PCP NOTES >>>>>> 03/29/2015  . Paroxysmal atrial fibrillation ([place]) 03/27/2015  . 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 TRIGGER FINGER, LEFT MIDDLE 06/08/2010  . HAND PAIN, LEFT 06/08/2010  . ANXIETY 12/19/2006  . 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 Dyslipidemia 10/01/2006  . Diabetes ([place]) 05/04/2006  . ERECTILE DYSFUNCTION 05/04/2006  . Essential hypertension 05/04/2006  . Coronary atherosclerosis 05/04/2006  . OSTEOARTHRITIS 05/04/2006  . COLONIC POLYPS, HX OF 05/04/2006    Past Surgical History:  Procedure Laterality Date  . ANKLE FUSION Right   . APPENDECTOMY    . CARPAL TUNNEL RELEASE     B, 2000  . CYSTOSCOPY  2017  . HIP SURGERY Right 1964   pins  . HIP SURGERY  2009   RIGHT--s/p hip replacement, s/p revision August 2009 due to dislocation x 3   . HIP SURGERY  04-2012, 2017   R hip revision  . KNEE SURGERY Left 2005  . MEDIAL PARTIAL KNEE REPLACEMENT Right 08-2012  . TOTAL HIP ARTHROPLASTY  08-09-15   [facility]  Home Medications    Prior to Admission medications   Medication Sig Start Date End Date Taking? Authorizing Provider  acetaminophen (TYLENOL) 500 MG tablet Take 1,000 mg by mouth every 6 (six) hours as needed for moderate pain.    Yes [provider]  apixaban (ELIQUIS) 5 MG TABS tablet Take 1 tablet (5 mg total) by mouth 2 (two) times daily. 11/07/16  Yes Paz, Alda Berthold, MD  buPROPion (WELLBUTRIN) 75 MG tablet Take 1 tablet (75 mg total) by mouth 2 (two) times daily. 05/25/16  Yes Paz, Alda Berthold, MD  Cinnamon 500 MG capsule Take 1,000 mg by mouth 2 (two) times daily.    Yes [provider]    insulin glargine (LANTUS) 100 UNIT/ML injection Inject 5 Units into the skin at bedtime.   Yes [provider]  loratadine (CLARITIN) 10 MG tablet Take 10 mg by mouth every morning.  12/02/14  Yes Colon Branch, MD  metFORMIN (GLUCOPHAGE) 1000 MG tablet Take 1 tablet (1,000 mg total) by mouth 2 (two) times daily with a meal. 12/02/14  Yes Colon Branch, MD  Multiple Vitamin (MULTIVITAMIN) tablet Take 1 tablet by mouth every morning.  08/27/12  Yes [provider]  omega-3 acid ethyl esters (LOVAZA) 1 g capsule Take 2 g by mouth 2 (two) times daily.    Yes [provider]  omeprazole (PRILOSEC) 20 MG capsule Take 1 capsule (20 mg total) by mouth daily. 12/02/14  Yes Paz, Alda Berthold, MD  polyethylene glycol [facility] Floria Raveling) packet Take 17 g by mouth as needed for mild constipation.   Yes [provider]  polyvinyl alcohol (LIQUIFILM TEARS) 1.4 % ophthalmic solution Place 1 drop into both eyes 3 (three) times daily as needed for dry eyes.   Yes [provider]  simvastatin (ZOCOR) 40 MG tablet Take 40 mg by mouth daily.    Yes [provider]  tamsulosin (FLOMAX) 0.4 MG CAPS capsule Take 0.4 mg by mouth daily.   Yes [provider]  vitamin B-12 (CYANOCOBALAMIN) 250 MCG tablet Take 250 mcg by mouth daily.   Yes [provider]  meclizine (ANTIVERT) 12.5 MG tablet Take 1 tablet (12.5 mg total) by mouth 2 (two) times daily as needed for dizziness. 12/17/16   McDonald, Mia A, PA-C    Family History Family History  Problem Relation Age of Onset  . Dementia Father   . Emphysema Father   . Diabetes Maternal Grandmother   . Heart disease Sister   . Stroke Sister   . Heart attack Neg Hx   . Hypertension Neg Hx     Social History Social History  Substance Use Topics  . Smoking status: Former Smoker    Quit date: 01/02/1981  . Smokeless tobacco: Never Used     Comment: quit 1980s  . Alcohol use No     Allergies    Pseudoephedrine   Review of Systems Review of Systems  Constitutional: Negative for activity change, chills and fever.  Eyes: Negative for visual disturbance.  Respiratory: Negative for shortness of breath.   Cardiovascular: Negative for chest pain.  Gastrointestinal: Negative for abdominal pain, diarrhea, nausea and vomiting.  Endocrine: Positive for polyuria.  Genitourinary: Positive for frequency. Negative for dysuria.  Musculoskeletal: Negative for back pain.  Skin: Negative for rash.  Allergic/Immunologic: Positive for immunocompromised state.  Neurological: Positive for dizziness, weakness and light-headedness. Negative for syncope and headaches.    Physical Exam Updated Vital Signs BP 135/81 (BP  Location: Left Arm)   Pulse (!) 56   Resp 16   SpO2 96%   Physical Exam  Constitutional: He appears well-developed.  HENT:  Head: Normocephalic.  Eyes: Conjunctivae are normal.  Neck: Neck supple.  Cardiovascular: Normal rate, regular rhythm, normal heart sounds and intact distal pulses.  Exam reveals no gallop and no friction rub.   No murmur heard. Pulmonary/Chest: Effort normal. No respiratory distress. He exhibits no tenderness.  Mild left-sided expiratory crackles.   Abdominal: Soft. Bowel sounds are normal. He exhibits no distension. There is no tenderness. There is no rebound and no guarding.  Musculoskeletal: Normal range of motion. He exhibits no edema, tenderness or deformity.  Neurological: He is alert.  Cranial nerves 2-12 intact. Finger-to-nose is normal. 5/5 motor strength of the bilateral upper and lower extremities. Moves all four extremities. Negative Romberg. NVI. Ambulatory with a walker throughout the room on exam.   Skin: Skin is warm and dry. Capillary refill takes less than 2 seconds.  Psychiatric: His behavior is normal.  Nursing note and vitals reviewed.    ED Treatments / Results  Labs (all labs ordered are listed, but only abnormal results are  displayed) Labs Reviewed  BASIC METABOLIC PANEL - Abnormal; Notable for the following:       Result Value   Glucose, Bld 137 (*)    Creatinine, Ser 0.59 (*)    All other components within normal limits  URINALYSIS, ROUTINE W REFLEX MICROSCOPIC - Abnormal; Notable for the following:    Color, Urine COLORLESS (*)    Specific Gravity, Urine 1.000 (*)    Glucose, UA >=500 (*)    All other components within normal limits  CBG MONITORING, ED - Abnormal; Notable for the following:    Glucose-Capillary 194 (*)    All other components within normal limits  CBC  I-STAT TROPONIN, ED    EKG  EKG Interpretation  Date/Time:  Monday December 17 2016 10:26:55 EDT Ventricular Rate:  53 PR Interval:    QRS Duration: 104 QT Interval:  439 QTC Calculation: 413 R Axis:   35 Text Interpretation:  Sinus rhythm  First degree AVB Baseline wander in lead(s) II III aVF Confirmed by Tanna Furry [phone]) on 12/17/2016 3:32:50 PM       Radiology Dg Chest 2 View  Result Date: 12/17/2016 CLINICAL DATA:  Left-sided crackles on exam EXAM: CHEST  2 VIEW COMPARISON:  07/16/2016 FINDINGS: Normal heart size and negative mediastinal contours. There is no edema, consolidation, effusion, or pneumothorax. Spondylosis. No acute osseous finding. IMPRESSION: No evidence of active disease.  Stable compared to prior. Electronically Signed   By: Monte Fantasia M.D.   On: 12/17/2016 15:59   Mr Brain Wo Contrast  Result Date: 12/17/2016 CLINICAL DATA:  76 y/o  M; the dizziness. EXAM: MRI HEAD WITHOUT CONTRAST TECHNIQUE: Multiplanar, multiecho pulse sequences of the brain and surrounding structures were obtained without intravenous contrast. COMPARISON:  07/17/2016 CT head.  12/17/2015 MRI head. FINDINGS: Motion degradation on multiple sequences. Brain: No acute infarction, hemorrhage, hydrocephalus, extra-axial collection or mass lesion. Stable patchy nonspecific foci of T2 FLAIR hyperintense signal abnormality in  subcortical and periventricular white matter is compatible with band chronic microvascular ischemic changes for age. Stable brain parenchymal volume loss. Stable small chronic lacunar infarction within the left thalamus. Vascular: Normal flow voids. Skull and upper cervical spine: Normal marrow signal. Sinuses/Orbits: Left frontal sinus mucosal thickening. Trace effusion in the left mastoid tip. Otherwise no significant abnormal signal of paranasal  sinuses or mastoid air cells. Orbits are unremarkable. Other: None. IMPRESSION: 1. No acute intracranial abnormality identified. 2. Stable advanced chronic microvascular ischemic changes and stable parenchymal volume loss of the brain. Stable small chronic lacunar infarct within the left thalamus. Electronically Signed   By: Kristine Garbe M.D.   On: 12/17/2016 18:50    Procedures Procedures (including critical care time)  Medications Ordered in ED Medications  LORazepam (ATIVAN) injection 1 mg (1 mg Intravenous Given 12/17/16 1730)  sodium chloride 0.9 % bolus 500 mL (0 mLs Intravenous Stopped 12/17/16 1730)     Initial Impression / Assessment and Plan / ED Course  I have reviewed the triage vital signs and the nursing notes.  Pertinent labs & imaging results that were available during my care of the patient were reviewed by me and considered in my medical decision making (see chart for details).     76 year old male with a h/o of DM and dementia presenting with dizziness, lightheadedness, and frequent urination. UA with >500 glucosuria and specific gravity of 1.00, which is new. Unremarkable for infection. Na is 135. Glucose is 137. CBC and remaining electrolytes are unremarkable. Mild expiratory crackles heard on exam; CXR unremarkable. Discussed the patient with Dr. Jeneen Rinks, attending physician. Given the patient's h/o of vascular dementia and new polyuria, will order brain MRI. Doubt SIADH or diabetes insipidus given normal serum sodium and  mildly elevated glucose. Brain MRI without acute pathology. Suspect the patient is dehydrated secondary to his frequent urination, which seems to be an appropriate response to his diabetes. Mild orthostatic HTN prior to IVF administration. After IVF, patient reports lightheadedness has improve, but the patient still reports being 'slightly off-balance'. Will send the patient home with a low-dose of meclizine for questionable peripheral veritgo since the MRI was unremarkable for a source of central vertigo. VSS. NAD. Strict return precautions given. The patient reports he feels much improved and is ready for d/c at this time.   Final Clinical Impressions(s) / ED Diagnoses   Final diagnoses:  Dehydration  Peripheral vertigo, unspecified laterality    New Prescriptions Discharge Medication List as of 12/17/2016  7:10 PM    START taking these medications   Details  meclizine (ANTIVERT) 12.5 MG tablet Take 1 tablet (12.5 mg total) by mouth 2 (two) times daily as needed for dizziness., Starting Mon 12/17/2016, Print         McDonald, Mia A, PA-C 12/18/16 1736    Tanna Furry, MD 12/24/16 1050

## 2016-12-17 NOTE — ED Triage Notes (Signed)
Per pt/family-states he became dizzy prior to going to bed last night-states same thing happened a couple of months ago-did not seek evaluation-states it resolved in 2 days-patient states it occurs when changing positions but does not go away when he sits down-states difficulty walking r/t his dizziness

## 2016-12-24 ENCOUNTER — Encounter: Payer: Self-pay | Admitting: Internal Medicine

## 2016-12-24 ENCOUNTER — Ambulatory Visit (INDEPENDENT_AMBULATORY_CARE_PROVIDER_SITE_OTHER): Payer: Medicare Other | Admitting: Internal Medicine

## 2016-12-24 VITALS — BP 138/62 | HR 58 | Temp 98.2°F | Resp 14 | Ht 78.0 in | Wt 232.1 lb

## 2016-12-24 DIAGNOSIS — H8190 Unspecified disorder of vestibular function, unspecified ear: Secondary | ICD-10-CM

## 2016-12-24 DIAGNOSIS — Z23 Encounter for immunization: Secondary | ICD-10-CM

## 2016-12-24 DIAGNOSIS — R399 Unspecified symptoms and signs involving the genitourinary system: Secondary | ICD-10-CM | POA: Diagnosis not present

## 2016-12-24 DIAGNOSIS — R42 Dizziness and giddiness: Secondary | ICD-10-CM

## 2016-12-24 DIAGNOSIS — Z794 Long term (current) use of insulin: Secondary | ICD-10-CM

## 2016-12-24 DIAGNOSIS — E1159 Type 2 diabetes mellitus with other circulatory complications: Secondary | ICD-10-CM | POA: Diagnosis not present

## 2016-12-24 NOTE — Progress Notes (Signed)
Subjective:    Patient ID: Ivan Anderson, male    DOB: November 25, 1940, 76 y.o.   MRN: [id number]  DOS:  12/24/2016 Type of visit - description : ED F/u Interval history: Was seen at the ER 12/17/16 d/t dizziness, "room spinning", worse with positional changes. Also noted frequent urination felt to be polyuria due to diabetes. BMP, CBC, troponin, UA were negative. CBG 194 Chest x-ray okay MRI of the brain with only chronic changes Patient was felt to be dehydrated possibly from polyuria, got IV fluids, felt better and was discharged home  Wt Readings from Last 3 Encounters:  12/24/16 232 lb 2 oz (105.3 kg)  10/09/16 233 lb 2 oz (105.7 kg)  09/07/16 229 lb (103.9 kg)    Review of Systems Since he left the ER he is doing about the same. Still dizzy, room is spinning with change of head position or standing up. Wife also report that he has no energy. He went to church and after that he was "wiped out". No chest pain, difficulty breathing or palpitations No diplopia or slurred speech He continue urinating frequently sometimes once every hour, sometimes amount of urine is small and others is not. No dysuria, difficulty urinating or gross hematuria. No fever chills No headaches CBGs usually in the 160s, in the last month has seen a CBG over 200 only twice.   Past Medical History:  Diagnosis Date  . Anxiety   . Atrial fibrillation, new onset ([place]) 03/27/2015  . CAD (coronary artery disease)   . Colon polyp    Cscope at [facility] aprox 2006, repeated 2010 (-), next 5 years  . Diabetes mellitus with neuropathy ([place])   . Diaphragm paralysis    R, per pt   . DJD (degenerative joint disease)   . Erectile dysfunction   . Eye muscle paralysis    congenital, lateral rectus, left  . Heart palpitations   . Hemorrhoids   . Hyperlipidemia   . Microscopic hematuria 2017   seeing urology  . Mild cognitive disorder    Unspecified mild NCD  . Skin cancer    sees derm    Past Surgical History:    Procedure Laterality Date  . ANKLE FUSION Right   . APPENDECTOMY    . CARPAL TUNNEL RELEASE     B, 2000  . CYSTOSCOPY  2017  . HIP SURGERY Right 1964   pins  . HIP SURGERY  2009   RIGHT--s/p hip replacement, s/p revision August 2009 due to dislocation x 3   . HIP SURGERY  04-2012, 2017   R hip revision  . KNEE SURGERY Left 2005  . MEDIAL PARTIAL KNEE REPLACEMENT Right 08-2012  . TOTAL HIP ARTHROPLASTY  08-09-15   [facility]    Social History   Social History  . Marital status: Married    Spouse name: N/A  . Number of children: N/A  . 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 Smoking status: Former Smoker    Quit date: 01/02/1981  . Smokeless tobacco: Never Used     Comment: quit 1980s  . Alcohol use No  . Drug use: No  . Sexual activity: Not on file   Other Topics Concern  . Not on file   Social History Narrative  . No narrative on file      Allergies as of 12/24/2016      Reactions   Pseudoephedrine Other (See  Comments)   Dx: A. Fib.       Medication List       Accurate as of 12/24/16 11:59 PM. Always use your most recent med list.          acetaminophen 500 MG tablet Commonly known as:  TYLENOL Take 1,000 mg by mouth every 6 (six) hours as needed for moderate pain.   apixaban 5 MG Tabs tablet Commonly known as:  ELIQUIS Take 1 tablet (5 mg total) by mouth 2 (two) times daily.   buPROPion 75 MG tablet Commonly known as:  WELLBUTRIN Take 1 tablet (75 mg total) by mouth 2 (two) times daily.   Cinnamon 500 MG capsule Take 1,000 mg by mouth 2 (two) times daily.   insulin glargine 100 UNIT/ML injection Commonly known as:  LANTUS Inject 5 Units into the skin at bedtime.   loratadine 10 MG tablet Commonly known as:  CLARITIN Take 10 mg by mouth every morning.   meclizine 12.5 MG tablet Commonly known as:  ANTIVERT Take 1 tablet (12.5 mg total) by mouth 2 (two) times daily as needed for  dizziness.   metFORMIN 1000 MG tablet Commonly known as:  GLUCOPHAGE Take 1 tablet (1,000 mg total) by mouth 2 (two) times daily with a meal.   multivitamin tablet Take 1 tablet by mouth every morning.   omega-3 acid ethyl esters 1 g capsule Commonly known as:  LOVAZA Take 2 g by mouth 2 (two) times daily.   omeprazole 20 MG capsule Commonly known as:  PRILOSEC Take 1 capsule (20 mg total) by mouth daily.   polyethylene glycol packet Commonly known as:  MIRALAX / GLYCOLAX Take 17 g by mouth as needed for mild constipation.   polyvinyl alcohol 1.4 % ophthalmic solution Commonly known as:  LIQUIFILM TEARS Place 1 drop into both eyes 3 (three) times daily as needed for dry eyes.   simvastatin 40 MG tablet Commonly known as:  ZOCOR Take 40 mg by mouth daily.   tamsulosin 0.4 MG Caps capsule Commonly known as:  FLOMAX Take 0.4 mg by mouth daily.   vitamin B-12 250 MCG tablet Commonly known as:  CYANOCOBALAMIN Take 250 mcg by mouth daily.            Discharge Care Instructions        Start     Ordered   12/24/16 0000  Flu vaccine HIGH DOSE PF (Fluzone High dose)     12/24/16 1507   12/24/16 0000  Hemoglobin A1c     12/24/16 1518   12/24/16 0000  Urine Microalbumin w/creat. ratio     12/24/16 1518   12/24/16 0000  PSA     12/24/16 1518   12/24/16 0000  Urinalysis, Routine w reflex microscopic     12/24/16 1518   12/24/16 0000  Urine Culture     12/24/16 1518   12/24/16 0000  Ambulatory referral to Physical Therapy    Comments:  [facility]- vestibular rehab/therapy  [address], [place], [place] [address]   [phone] office [phone] fax   12/24/16 1521         Objective:   Physical Exam BP 138/62 (BP Location: Left Arm, Patient Position: Sitting, Cuff Size: Small)   Pulse (!) 58   Temp 98.2 F (36.8 C) (Oral)   Resp 14   Ht 6\' 6"  (1.981 m)   Wt 232 lb 2 oz (105.3 kg)   SpO2 97%   BMI 26.82  kg/m  General:     Well developed, well nourished . NAD.  HEENT:  Normocephalic . Face symmetric, atraumatic Lungs:  CTA B Normal respiratory effort, no intercostal retractions, no accessory muscle use. Heart: RRR,  no murmur.  No pretibial edema bilaterally  Skin: Not pale. Not jaundice DRE: No stools found, prostate slightly enlarged, nontender, otherwise normal. Neurologic:  alert & oriented X3.  Speech normal, gait assisted by a cane, he does seem to have some unsteadiness and needs to hold onto the furniture around him. Motor exam symmetric except for well-known left eye palsy Psych--  Cognition and judgment appear intact.  Cooperative with normal attention span and concentration.  Behavior appropriate. No anxious or depressed appearing.      Assessment & Plan:   Assessment -- seen regularly at [name], most chronic medical  issues f/u there DM with neuropathy  -- [facility] Hyperlipidemia Anxiety Gait disorder, multifactorial  CV: --CAD --A. 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 Rx not driving, family to provide medications. Intolerant to Aricept and Namenda  H/o eye muscle paralysis congenital  PLAN:  Dizziness: Ongoing issue for the patient, w/u at the ER (-) except for glucosuria; dizziness  previously noted by cardiology to  improve after CCB and ARBs were stopped. We did orthostatic VS her today: SBP  Dropped by only 10 points  from standing to lying down. Not very significant . Sxs are somewhat better with meclizine prescribed by the ER. Although he improved temporarily with IF Fluids at ER and they felt he could have been dehydrated, he has no problems drinking fluids and a BUN was not elevated. Plan: Referral for vestibular rehabilitation. Keep himself hydrated, meclizine. DM: Well-controlled to my  knowledge, check A1c. Has glucosuria (checking a  a Udip), check a micro Urinary frequency: On Flomax, DRE not consistent with prostatitis, check a PSA, Udip, UCX  Fatigue: Likely multifactorial, reassess in one month. Recent B12 normal RTC one month

## 2016-12-24 NOTE — Progress Notes (Signed)
Pre visit review using our clinic review tool, if applicable. No additional management support is needed unless otherwise documented below in the visit note. 

## 2016-12-24 NOTE — Patient Instructions (Addendum)
GO TO THE LAB : Get the blood work     GO TO THE FRONT DESK Schedule your next appointment for a  checkup in one month    We are referring you to physical therapy  Continue meclizine as needed for dizziness, drink plenty of fluids  Fall Prevention in the Home Falls can cause injuries and can affect people from all age groups. There are many simple things that you can do to make your home safe and to help prevent falls. What can I do on the outside of my home?  Regularly repair the edges of walkways and driveways and fix any cracks.  Remove high doorway thresholds.  Trim any shrubbery on the main path into your home.  Use bright outdoor lighting.  Clear walkways of debris and clutter, including tools and rocks.  Regularly check that handrails are securely fastened and in good repair. Both sides of any steps should have handrails.  Install guardrails along the edges of any raised decks or porches.  Have leaves, snow, and ice cleared regularly.  Use sand or salt on walkways during winter months.  In the garage, clean up any spills right away, including grease or oil spills. What can I do in the bathroom?  Use night lights.  Install grab bars by the toilet and in the tub and shower. Do not use towel bars as grab bars.  Use non-skid mats or decals on the floor of the tub or shower.  If you need to sit down while you are in the shower, use a plastic, non-slip stool.  Keep the floor dry. Immediately clean up any water that spills on the floor.  Remove soap buildup in the tub or shower on a regular basis.  Attach bath mats securely with double-sided non-slip rug tape.  Remove throw rugs and other tripping hazards from the floor. What can I do in the bedroom?  Use night lights.  Make sure that a bedside light is easy to reach.  Do not use oversized bedding that drapes onto the floor.  Have a firm chair that has side arms to use for getting dressed.  Remove throw  rugs and other tripping hazards from the floor. What can I do in the kitchen?  Clean up any spills right away.  Avoid walking on wet floors.  Place frequently used items in easy-to-reach places.  If you need to reach for something above you, use a sturdy step stool that has a grab bar.  Keep electrical cables out of the way.  Do not use floor polish or wax that makes floors slippery. If you have to use wax, make sure that it is non-skid floor wax.  Remove throw rugs and other tripping hazards from the floor. What can I do in the stairways?  Do not leave any items on the stairs.  Make sure that there are handrails on both sides of the stairs. Fix handrails that are broken or loose. Make sure that handrails are as long as the stairways.  Check any carpeting to make sure that it is firmly attached to the stairs. Fix any carpet that is loose or worn.  Avoid having throw rugs at the top or bottom of stairways, or secure the rugs with carpet tape to prevent them from moving.  Make sure that you have a light switch at the top of the stairs and the bottom of the stairs. If you do not have them, have them installed. What are some other fall  prevention tips?  Wear closed-toe shoes that fit well and support your feet. Wear shoes that have rubber soles or low heels.  When you use a stepladder, make sure that it is completely opened and that the sides are firmly locked. Have someone hold the ladder while you are using it. Do not climb a closed stepladder.  Add color or contrast paint or tape to grab bars and handrails in your home. Place contrasting color strips on the first and last steps.  Use mobility aids as needed, such as canes, walkers, scooters, and crutches.  Turn on lights if it is dark. Replace any light bulbs that burn out.  Set up furniture so that there are clear paths. Keep the furniture in the same spot.  Fix any uneven floor surfaces.  Choose a carpet design that does  not hide the edge of steps of a stairway.  Be aware of any and all pets.  Review your medicines with your healthcare provider. Some medicines can cause dizziness or changes in blood pressure, which increase your risk of falling. Talk with your health care provider about other ways that you can decrease your risk of falls. This may include working with a physical therapist or trainer to improve your strength, balance, and endurance. This information is not intended to replace advice given to you by your health care provider. Make sure you discuss any questions you have with your health care provider. Document Released: 03/16/2002 Document Revised: 08/23/2015 Document Reviewed: 04/30/2014 Elsevier Interactive Patient Education  2017 [employer].

## 2016-12-25 LAB — URINALYSIS, ROUTINE W REFLEX MICROSCOPIC
Bilirubin Urine: NEGATIVE
HGB URINE DIPSTICK: NEGATIVE
LEUKOCYTES UA: NEGATIVE
NITRITE: NEGATIVE
SPECIFIC GRAVITY, URINE: 1.025 (ref 1.000–1.030)
TOTAL PROTEIN, URINE-UPE24: NEGATIVE
URINE GLUCOSE: NEGATIVE
UROBILINOGEN UA: 0.2 (ref 0.0–1.0)
pH: 5.5 (ref 5.0–8.0)

## 2016-12-25 LAB — MICROALBUMIN / CREATININE URINE RATIO
Creatinine,U: 102 mg/dL
Microalb Creat Ratio: 1.2 mg/g (ref 0.0–30.0)
Microalb, Ur: 1.2 mg/dL (ref 0.0–1.9)

## 2016-12-25 LAB — HEMOGLOBIN A1C: HEMOGLOBIN A1C: 6.6 % — AB (ref 4.6–6.5)

## 2016-12-25 LAB — URINE CULTURE
MICRO NUMBER:: 81023967
RESULT: NO GROWTH
SPECIMEN QUALITY:: ADEQUATE

## 2016-12-25 LAB — PSA: PSA: 2.59 ng/mL (ref 0.10–4.00)

## 2016-12-25 NOTE — Assessment & Plan Note (Signed)
Dizziness: Ongoing issue for the patient, w/u at the ER (-) except for glucosuria; dizziness  previously noted by cardiology to  improve after CCB and ARBs were stopped. We did orthostatic VS her today: SBP  Dropped by only 10 points  from standing to lying down. Not very significant . Sxs are somewhat better with meclizine prescribed by the ER. Although he improved temporarily with IF Fluids at ER and they felt he could have been dehydrated, he has no problems drinking fluids and a BUN was not elevated. Plan: Referral for vestibular rehabilitation. Keep himself hydrated, meclizine. DM: Well-controlled to my knowledge, check A1c. Has glucosuria (checking a  a Udip), check a micro Urinary frequency: On Flomax, DRE not consistent with prostatitis, check a PSA, Udip, UCX  Fatigue: Likely multifactorial, reassess in one month. Recent B12 normal RTC one month

## 2016-12-26 DIAGNOSIS — R262 Difficulty in walking, not elsewhere classified: Secondary | ICD-10-CM | POA: Diagnosis not present

## 2016-12-26 DIAGNOSIS — H8192 Unspecified disorder of vestibular function, left ear: Secondary | ICD-10-CM | POA: Diagnosis not present

## 2016-12-26 DIAGNOSIS — R42 Dizziness and giddiness: Secondary | ICD-10-CM | POA: Diagnosis not present

## 2016-12-26 DIAGNOSIS — H8112 Benign paroxysmal vertigo, left ear: Secondary | ICD-10-CM | POA: Diagnosis not present

## 2016-12-28 ENCOUNTER — Telehealth: Payer: Self-pay

## 2016-12-28 NOTE — Telephone Encounter (Signed)
PT Plan of Care received from [facility], form signed and faxed to [phone]. Form sent for scanning.

## 2016-12-31 ENCOUNTER — Telehealth: Payer: Self-pay | Admitting: Internal Medicine

## 2016-12-31 MED ORDER — MECLIZINE HCL 12.5 MG PO TABS: 12.5000 mg | ORAL_TABLET | Freq: Two times a day (BID) | ORAL | 0 refills | Status: DC | PRN

## 2016-12-31 NOTE — Telephone Encounter (Signed)
°  Relation to DG:LOVF Call back number:[phone] Pharmacy: [facility], [place] [phone] (Phone) [phone] (Fax)     Reason for call:  Spouse requesting a refill meclizine (ANTIVERT) 12.5 MG tablet

## 2016-12-31 NOTE — Telephone Encounter (Signed)
Ok #60, no RFs

## 2016-12-31 NOTE — Telephone Encounter (Signed)
Please advise, last refill 12/17/2016 #30 and 0rf.

## 2016-12-31 NOTE — Telephone Encounter (Signed)
Rx sent 

## 2017-01-01 DIAGNOSIS — H8112 Benign paroxysmal vertigo, left ear: Secondary | ICD-10-CM | POA: Diagnosis not present

## 2017-01-01 DIAGNOSIS — R42 Dizziness and giddiness: Secondary | ICD-10-CM | POA: Diagnosis not present

## 2017-01-01 DIAGNOSIS — R262 Difficulty in walking, not elsewhere classified: Secondary | ICD-10-CM | POA: Diagnosis not present

## 2017-01-01 DIAGNOSIS — H8192 Unspecified disorder of vestibular function, left ear: Secondary | ICD-10-CM | POA: Diagnosis not present

## 2017-01-03 ENCOUNTER — Telehealth: Payer: Self-pay | Admitting: *Deleted

## 2017-01-03 NOTE — Telephone Encounter (Signed)
Received Physician Orders [Plan of [place] from [facility]; forwarded to provider/SLS 09/27

## 2017-01-07 NOTE — Telephone Encounter (Signed)
Form signed and faxed to [facility] at [phone]. Form sent for scanning.

## 2017-01-14 ENCOUNTER — Ambulatory Visit (HOSPITAL_BASED_OUTPATIENT_CLINIC_OR_DEPARTMENT_OTHER)
Admission: RE | Admit: 2017-01-14 | Discharge: 2017-01-14 | Disposition: A | Discharge status: Home / Self Care | Payer: Medicare Other | Source: Ambulatory Visit | Attending: Internal Medicine | Admitting: Internal Medicine

## 2017-01-14 ENCOUNTER — Ambulatory Visit (INDEPENDENT_AMBULATORY_CARE_PROVIDER_SITE_OTHER): Payer: Medicare Other | Admitting: Internal Medicine

## 2017-01-14 ENCOUNTER — Telehealth: Payer: Self-pay

## 2017-01-14 ENCOUNTER — Encounter: Payer: Self-pay | Admitting: Internal Medicine

## 2017-01-14 VITALS — BP 126/68 | HR 71 | Temp 97.8°F | Resp 14 | Ht 78.0 in | Wt 233.1 lb

## 2017-01-14 DIAGNOSIS — R0781 Pleurodynia: Secondary | ICD-10-CM | POA: Insufficient documentation

## 2017-01-14 DIAGNOSIS — R399 Unspecified symptoms and signs involving the genitourinary system: Secondary | ICD-10-CM | POA: Diagnosis not present

## 2017-01-14 DIAGNOSIS — S2231XA Fracture of one rib, right side, initial encounter for closed fracture: Secondary | ICD-10-CM | POA: Insufficient documentation

## 2017-01-14 DIAGNOSIS — W19XXXA Unspecified fall, initial encounter: Secondary | ICD-10-CM | POA: Insufficient documentation

## 2017-01-14 DIAGNOSIS — I480 Paroxysmal atrial fibrillation: Secondary | ICD-10-CM

## 2017-01-14 MED ORDER — TAMSULOSIN HCL 0.4 MG PO CAPS: 0.4000 mg | ORAL_CAPSULE | Freq: Every day | ORAL | 5 refills | Status: DC

## 2017-01-14 NOTE — Telephone Encounter (Signed)
Pt called, he is NOT taking tamsulosin (Flomax).

## 2017-01-14 NOTE — Assessment & Plan Note (Signed)
PLAN:  Chest wall contusion: Apparently w/o a major injury. Will get a x-ray Gait disorder: s/p recent fall, prevention was discussed, has a walker at home, encouraged consistent use. For now they feel he does not need  PT specifically for fall prevention. L UTS: See last visit, continue with urinary frequency, DRE, PSA and urine test negative. Unclear if he is taking Flomax, recommend to go home and let me know. If he is taking Flomax already will consider adding a second agent for L UTS. Addendum: Not taking Flomax, recommend to restart, call if no better A. fib,   advised patient will need to start thinking about risks vs benefits of anticoagulation given falls. DM: CBGs has been elevated lately, 280 this morning, last A1c 6.6, continue monitoring. Dizziness: saw PT for vestibular rehabilitation 2, he felt it was helpful and plans to do exercises on its own. RTC 4 weeks

## 2017-01-14 NOTE — Patient Instructions (Addendum)
Your next appointment with me should be in one month. Cancel the one you have next week  Go to the first floor for a XR  Use a walker consistently  If you have another fall let me know  Your wife will start arranging your medicines  Please see the list we are providing today   Let us know ASAP if you are taking FLOMAX (TAMSULOSIN) for your prostate

## 2017-01-14 NOTE — Progress Notes (Signed)
Pre visit review using our clinic review tool, if applicable. No additional management support is needed unless otherwise documented below in the visit note. 

## 2017-01-14 NOTE — Progress Notes (Signed)
Subjective:    Patient ID: Ivan Anderson, male    DOB: Nov 07, 1940, 76 y.o.   MRN: [id number]  DOS:  01/14/2017 Type of visit - description :  acute, Here with his wife Interval history: He was at home 3 days ago, he stumbled for several  feet and then fall on a chair hitting his right anterior chest. No direct head injury, no neck injury. Paramedics were called because the wife could not get him up, he declined a ER visit. Has some pain, not enough to take any particular medication. The pain is worse when he tries to sleep on the right side. Not painful when he touch the area.   Review of Systems No difficulty breathing or cough No nausea, vomiting or abdominal pain No gross hematuria   Past Medical History:  Diagnosis Date  . Anxiety   . Atrial fibrillation, new onset ([place]) 03/27/2015  . CAD (coronary artery disease)   . Colon polyp    Cscope at [facility] aprox 2006, repeated 2010 (-), next 5 years  . Diabetes mellitus with neuropathy ([place])   . Diaphragm paralysis    R, per pt   . DJD (degenerative joint disease)   . Erectile dysfunction   . Eye muscle paralysis    congenital, lateral rectus, left  . Heart palpitations   . Hemorrhoids   . Hyperlipidemia   . Microscopic hematuria 2017   seeing urology  . Mild cognitive disorder    Unspecified mild NCD  . Skin cancer    sees derm    Past Surgical History:  Procedure Laterality Date  . ANKLE FUSION Right   . APPENDECTOMY    . CARPAL TUNNEL RELEASE     B, 2000  . CYSTOSCOPY  2017  . HIP SURGERY Right 1964   pins  . HIP SURGERY  2009   RIGHT--s/p hip replacement, s/p revision August 2009 due to dislocation x 3   . HIP SURGERY  04-2012, 2017   R hip revision  . KNEE SURGERY Left 2005  . MEDIAL PARTIAL KNEE REPLACEMENT Right 08-2012  . TOTAL HIP ARTHROPLASTY  08-09-15   [facility]    Social History   Social History  . Marital status: Married    Spouse name: N/A  . Number of children: N/A  . Years of  education: N/A   Occupational History  . realtor    Social History Main Topics  . Smoking status: Former Smoker    Quit date: 01/02/1981  . Smokeless tobacco: Never Used     Comment: quit 1980s  . Alcohol use No  . Drug use: No  . Sexual activity: Not on file   Other Topics Concern  . Not on file   Social History Narrative  . No narrative on file      Allergies as of 01/14/2017      Reactions   Pseudoephedrine Other (See Comments)   Dx: A. Fib.       Medication List       Accurate as of 01/14/17  4:50 PM. Always use your most recent med list.          acetaminophen 500 MG tablet Commonly known as:  TYLENOL Take 1,000 mg by mouth every 6 (six) hours as needed for moderate pain.   apixaban 5 MG Tabs tablet Commonly known as:  ELIQUIS Take 1 tablet (5 mg total) by mouth 2 (two) times daily.   buPROPion 75 MG tablet Commonly known as:  WELLBUTRIN Take 1 tablet (75 mg total) by mouth 2 (two) times daily.   Cinnamon 500 MG capsule Take 1,000 mg by mouth 2 (two) times daily.   insulin glargine 100 UNIT/ML injection Commonly known as:  LANTUS Inject 5 Units into the skin at bedtime.   loratadine 10 MG tablet Commonly known as:  CLARITIN Take 10 mg by mouth every morning.   meclizine 12.5 MG tablet Commonly known as:  ANTIVERT Take 1 tablet (12.5 mg total) by mouth 2 (two) times daily as needed for dizziness.   metFORMIN 1000 MG tablet Commonly known as:  GLUCOPHAGE Take 1 tablet (1,000 mg total) by mouth 2 (two) times daily with a meal.   multivitamin tablet Take 1 tablet by mouth every morning.   omega-3 acid ethyl esters 1 g capsule Commonly known as:  LOVAZA Take 2 g by mouth 2 (two) times daily.   omeprazole 20 MG capsule Commonly known as:  PRILOSEC Take 1 capsule (20 mg total) by mouth daily.   polyethylene glycol packet Commonly known as:  MIRALAX / GLYCOLAX Take 17 g by mouth as needed for mild constipation.   polyvinyl alcohol 1.4 %  ophthalmic solution Commonly known as:  LIQUIFILM TEARS Place 1 drop into both eyes 3 (three) times daily as needed for dry eyes.   simvastatin 40 MG tablet Commonly known as:  ZOCOR Take 40 mg by mouth daily.   tamsulosin 0.4 MG Caps capsule Commonly known as:  FLOMAX Take 0.4 mg by mouth daily.   vitamin B-12 250 MCG tablet Commonly known as:  CYANOCOBALAMIN Take 250 mcg by mouth daily.          Objective:   Physical Exam  Pulmonary/Chest:     BP 126/68 (BP Location: Left Arm, Patient Position: Sitting, Cuff Size: Small)   Pulse 71   Temp 97.8 F (36.6 C) (Oral)   Resp 14   Ht 6\' 6"  (1.981 m)   Wt 233 lb 2 oz (105.7 kg)   SpO2 95%   BMI 26.94 kg/m  General:   Well developed, well nourished . NAD.  HEENT:  Normocephalic . Face symmetric, atraumatic Lungs:  CTA B Normal respiratory effort, no intercostal retractions, no accessory muscle use. Heart: RRR,  no murmur.  No pretibial edema bilaterally  Skin: Not pale. Not jaundice Neurologic:  alert & oriented X3.  Speech normal, gait assisted by a cane. At baseline. Motor symmetric except for well known eye paresis. Right foot range of motion decreased due to ankle issues. Psych--  Cognition and judgment appear intact.  Cooperative with normal attention span and concentration.  Behavior appropriate. No anxious or depressed appearing.      Assessment & Plan:   Assessment -- seen regularly at [name], most chronic medical  issues f/u there DM with neuropathy  -- [facility] Hyper[facility] Anxiety Gait disorder, multifactorial  CV: --CAD --A. 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 Rx not driving, family to provide medications. Intolerant to Aricept and Namenda  H/o eye muscle paralysis congenital  PLAN:  Chest wall contusion:  Apparently w/o a major injury. Will get a x-ray Gait disorder: s/p recent fall, prevention was discussed, has a walker at home, encouraged consistent use. For now they feel he does not need  PT specifically for fall prevention. L UTS: See last visit, continue with  urinary frequency, DRE, PSA and urine test negative. Unclear if he is taking Flomax, recommend to go home and let me know. If he is taking Flomax already will consider adding a second agent for L UTS. Addendum: Not taking Flomax, recommend to restart, call if no better A. fib,   advised patient will need to start thinking about risks vs benefits of anticoagulation given falls. DM: CBGs has been elevated lately, 280 this morning, last A1c 6.6, continue monitoring. Dizziness: saw PT for vestibular rehabilitation 2, he felt it was helpful and plans to do exercises on its own. RTC 4 weeks   Today, I spent more than 25   min with the patient: >50% of the time counseling regards  fall prevention, risk and benefits of anticoagulation

## 2017-01-14 NOTE — Telephone Encounter (Signed)
Spoke w/ Pt, informed of recommendations. Flomax sent to [facility].

## 2017-01-14 NOTE — Telephone Encounter (Signed)
Advise patient to go back on  Tamsulosin one tablet at bedtime. If urinary frequency is not decreasing he is to let me know

## 2017-01-21 ENCOUNTER — Ambulatory Visit: Payer: Medicare Other | Admitting: Internal Medicine

## 2017-02-12 ENCOUNTER — Encounter: Payer: Self-pay | Admitting: Neurology

## 2017-02-12 ENCOUNTER — Ambulatory Visit: Payer: Medicare Other | Admitting: Neurology

## 2017-02-12 VITALS — BP 124/76 | HR 54 | Ht 78.0 in | Wt 242.0 lb

## 2017-02-12 DIAGNOSIS — R441 Visual hallucinations: Secondary | ICD-10-CM

## 2017-02-12 DIAGNOSIS — R42 Dizziness and giddiness: Secondary | ICD-10-CM | POA: Diagnosis not present

## 2017-02-12 DIAGNOSIS — F0150 Vascular dementia without behavioral disturbance: Secondary | ICD-10-CM

## 2017-02-12 MED ORDER — MEMANTINE HCL 10 MG PO TABS: 10.0000 mg | ORAL_TABLET | Freq: Two times a day (BID) | ORAL | 4 refills | Status: AC

## 2017-02-12 MED ORDER — MEMANTINE HCL 28 X 5 MG & 21 X 10 MG PO TABS: ORAL_TABLET | ORAL | 0 refills | Status: DC

## 2017-02-12 NOTE — Patient Instructions (Signed)
Start Namenda. A titration pack has been sent to your pharmacy. After you finish that you can get the maintenance dose of Namenda 10 mg - one tablet twice daily.
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
## 2017-02-14 ENCOUNTER — Ambulatory Visit (INDEPENDENT_AMBULATORY_CARE_PROVIDER_SITE_OTHER): Payer: Medicare Other | Admitting: Internal Medicine

## 2017-02-14 ENCOUNTER — Encounter: Payer: Self-pay | Admitting: Internal Medicine

## 2017-02-14 VITALS — BP 132/76 | HR 76 | Temp 97.6°F | Resp 14 | Ht 78.0 in | Wt 243.0 lb

## 2017-02-14 DIAGNOSIS — R269 Unspecified abnormalities of gait and mobility: Secondary | ICD-10-CM | POA: Insufficient documentation

## 2017-02-14 DIAGNOSIS — R399 Unspecified symptoms and signs involving the genitourinary system: Secondary | ICD-10-CM | POA: Insufficient documentation

## 2017-02-14 DIAGNOSIS — R42 Dizziness and giddiness: Secondary | ICD-10-CM

## 2017-02-14 DIAGNOSIS — R0781 Pleurodynia: Secondary | ICD-10-CM | POA: Diagnosis not present

## 2017-02-14 NOTE — Patient Instructions (Signed)
  GO TO THE FRONT DESK Schedule your next appointment for a  Check up in  4 to 5  months

## 2017-02-14 NOTE — Progress Notes (Signed)
Pre visit review using our clinic review tool, if applicable. No additional management support is needed unless otherwise documented below in the visit note. 

## 2017-02-14 NOTE — Progress Notes (Signed)
Subjective:    Patient ID: Ivan Anderson, male    DOB: January 11, 1941, 76 y.o.   MRN: [id number]  DOS:  02/14/2017 Type of visit - description : f/u Interval history: Follow-up from previous visit. Still slightly dizzy, not as often as before. Saw neurology, they recommend Namenda. L UTS: Started Flomax, symptoms definitely better.   Review of Systems Denies falls since the last visit Had chest wall pain, but that resolved Ambulatory CBGs improved, they range from 90-200.  Past Medical History:  Diagnosis Date  . Anxiety   . Atrial fibrillation, new onset ([place]) 03/27/2015  . CAD (coronary artery disease)   . Colon polyp    Cscope at [facility] aprox 2006, repeated 2010 (-), next 5 years  . Diabetes mellitus with neuropathy ([place])   . Diaphragm paralysis    R, per pt   . DJD (degenerative joint disease)   . Erectile dysfunction   . Eye muscle paralysis    congenital, lateral rectus, left  . Heart palpitations   . Hemorrhoids   . Hyperlipidemia   . Microscopic hematuria 2017   seeing urology  . Mild cognitive disorder    Unspecified mild NCD  . Skin cancer    sees derm    Past Surgical History:  Procedure Laterality Date  . ANKLE FUSION Right   . APPENDECTOMY    . CARPAL TUNNEL RELEASE     B, 2000  . CYSTOSCOPY  2017  . HIP SURGERY Right 1964   pins  . HIP SURGERY  2009   RIGHT--s/p hip replacement, s/p revision August 2009 due to dislocation x 3   . HIP SURGERY  04-2012, 2017   R hip revision  . KNEE SURGERY Left 2005  . MEDIAL PARTIAL KNEE REPLACEMENT Right 08-2012  . TOTAL HIP ARTHROPLASTY  08-09-15   [facility]    Social History   Socioeconomic History  . Marital status: Married    Spouse name: Not on file  . Number of children: Not on file  . Years of education: Not on file  . Highest education level: Not on file  Social Needs  . Financial resource strain: Not on file  . Food insecurity - worry: Not on file  . Food insecurity - inability: Not on file    . Transportation needs - medical: Not on file  . Transportation needs - non-medical: Not on file  Occupational History  . Occupation: realtor  Tobacco Use  . Smoking status: Former Smoker    Last attempt to quit: 01/02/1981    Years since quitting: 36.1  . Smokeless tobacco: Never Used  . Tobacco comment: quit 1980s  Substance and Sexual Activity  . Alcohol use: No    Alcohol/week: 0.0 oz  . Drug use: No  . Sexual activity: Not on file  Other Topics Concern  . Not on file  Social History Narrative  . Not on file      Allergies as of 02/14/2017      Reactions   Pseudoephedrine Other (See Comments)   Dx: A. Fib.       Medication List        Accurate as of 02/14/17 12:53 PM. Always use your most recent med list.          acetaminophen 500 MG tablet Commonly known as:  TYLENOL Take 1,000 mg by mouth every 6 (six) hours as needed for moderate pain.   apixaban 5 MG Tabs tablet Commonly known as:  ELIQUIS Take  1 tablet (5 mg total) by mouth 2 (two) times daily.   buPROPion 75 MG tablet Commonly known as:  WELLBUTRIN Take 1 tablet (75 mg total) by mouth 2 (two) times daily.   Cinnamon 500 MG capsule Take 1,000 mg by mouth 2 (two) times daily.   insulin glargine 100 UNIT/ML injection Commonly known as:  LANTUS Inject 5 Units into the skin at bedtime.   loratadine 10 MG tablet Commonly known as:  CLARITIN Take 10 mg by mouth every morning.   meclizine 12.5 MG tablet Commonly known as:  ANTIVERT Take 1 tablet (12.5 mg total) by mouth 2 (two) times daily as needed for dizziness.   memantine tablet pack Commonly known as:  NAMENDA TITRATION PAK 5 mg/day for =1 week; 5 mg twice daily for =1 week; 15 mg/day given in 5 mg and 10 mg separated doses for =1 week; then 10 mg twice daily   memantine 10 MG tablet Commonly known as:  NAMENDA Take 1 tablet (10 mg total) 2 (two) times daily by mouth.   metFORMIN 1000 MG tablet Commonly known as:  GLUCOPHAGE Take 1  tablet (1,000 mg total) by mouth 2 (two) times daily with a meal.   multivitamin tablet Take 1 tablet by mouth every morning.   omega-3 acid ethyl esters 1 g capsule Commonly known as:  LOVAZA Take 2 g by mouth 2 (two) times daily.   omeprazole 20 MG capsule Commonly known as:  PRILOSEC Take 1 capsule (20 mg total) by mouth daily.   polyethylene glycol packet Commonly known as:  MIRALAX / GLYCOLAX Take 17 g by mouth as needed for mild constipation.   polyvinyl alcohol 1.4 % ophthalmic solution Commonly known as:  LIQUIFILM TEARS Place 1 drop into both eyes 3 (three) times daily as needed for dry eyes.   simvastatin 40 MG tablet Commonly known as:  ZOCOR Take 40 mg by mouth daily.   tamsulosin 0.4 MG Caps capsule Commonly known as:  FLOMAX Take 1 capsule (0.4 mg total) by mouth at bedtime.   vitamin B-12 250 MCG tablet Commonly known as:  CYANOCOBALAMIN Take 250 mcg by mouth daily.          Objective:   Physical Exam BP 132/76 (BP Location: Right Arm, Patient Position: Sitting, Cuff Size: Small)   Pulse 76   Temp 97.6 F (36.4 C) (Oral)   Resp 14   Ht 6\' 6"  (1.981 m)   Wt 243 lb (110.2 kg)   SpO2 97%   BMI 28.08 kg/m    General:   Well developed, well nourished . NAD.  HEENT:  Normocephalic . Face symmetric, atraumatic Lungs:  CTA B Normal respiratory effort, no intercostal retractions, no accessory muscle use. Heart: RRR,  no murmur.  No pretibial edema bilaterally  Skin: Not pale. Not jaundice Neurologic:  alert & oriented X3.  Speech normal, gait assisted by a cane. At baseline.Marland Kitchen Psych--  Cognition and judgment appear intact.  Cooperative with normal attention span and concentration.  Behavior appropriate. No anxious or depressed appearing.       Assessment & Plan:   Assessment -- seen regularly at [name], most chronic medical  issues f/u there DM with neuropathy  -- [facility] Hyperlipidemia Anxiety Gait disorder, multifactorial   CV: --CAD --A. 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 Rx not  driving, family to provide medications. Intolerant to Aricept and Namenda  H/o eye muscle paralysis congenital  PLAN:  Chest wall contusion: Since the last visit the x-rays showed a nondisplaced rib fracture.  Now pain-free Gait disorder: No falls since the last time, not using his walker, he usually uses a walking stick.  Recommend a walker, or if he refuses then 2 walking sticks would be of help. Dizziness: Slightly better L UTS: Patient restarted Flomax, improved. Dementia: Saw neurology, note reviewed, they are going to retry Namenda. Plans to see the [place] doctor soon, RTC here 5 months

## 2017-02-14 NOTE — Assessment & Plan Note (Signed)
Chest wall contusion: Since the last visit the x-rays showed a nondisplaced rib fracture.  Now pain-free Gait disorder: No falls since the last time, not using his walker, he usually uses a walking stick.  Recommend a walker, or if he refuses then 2 walking sticks would be of help. Dizziness: Slightly better L UTS: Patient restarted Flomax, improved. Dementia: Saw neurology, note reviewed, they are going to retry Namenda. Plans to see the [place] doctor soon, RTC here 5 months

## 2017-03-29 ENCOUNTER — Other Ambulatory Visit: Payer: Self-pay | Admitting: Internal Medicine

## 2017-03-31 ENCOUNTER — Encounter (HOSPITAL_COMMUNITY): Payer: Self-pay | Admitting: Emergency Medicine

## 2017-03-31 ENCOUNTER — Emergency Department (HOSPITAL_COMMUNITY)
Admission: EM | Admit: 2017-03-31 | Discharge: 2017-03-31 | Disposition: A | Discharge status: Home / Self Care | Payer: Medicare Other | Attending: Emergency Medicine | Admitting: Emergency Medicine

## 2017-03-31 DIAGNOSIS — I5032 Chronic diastolic (congestive) heart failure: Secondary | ICD-10-CM | POA: Diagnosis not present

## 2017-03-31 DIAGNOSIS — R42 Dizziness and giddiness: Secondary | ICD-10-CM | POA: Diagnosis not present

## 2017-03-31 DIAGNOSIS — Z7984 Long term (current) use of oral hypoglycemic drugs: Secondary | ICD-10-CM | POA: Insufficient documentation

## 2017-03-31 DIAGNOSIS — Z87891 Personal history of nicotine dependence: Secondary | ICD-10-CM | POA: Insufficient documentation

## 2017-03-31 DIAGNOSIS — I110 Hypertensive heart disease with heart failure: Secondary | ICD-10-CM | POA: Insufficient documentation

## 2017-03-31 DIAGNOSIS — R9431 Abnormal electrocardiogram [ECG] [EKG]: Secondary | ICD-10-CM | POA: Diagnosis not present

## 2017-03-31 DIAGNOSIS — I4891 Unspecified atrial fibrillation: Secondary | ICD-10-CM | POA: Insufficient documentation

## 2017-03-31 DIAGNOSIS — Z96651 Presence of right artificial knee joint: Secondary | ICD-10-CM | POA: Insufficient documentation

## 2017-03-31 DIAGNOSIS — E119 Type 2 diabetes mellitus without complications: Secondary | ICD-10-CM | POA: Insufficient documentation

## 2017-03-31 DIAGNOSIS — E860 Dehydration: Secondary | ICD-10-CM

## 2017-03-31 DIAGNOSIS — Z96641 Presence of right artificial hip joint: Secondary | ICD-10-CM | POA: Diagnosis not present

## 2017-03-31 DIAGNOSIS — Z79899 Other long term (current) drug therapy: Secondary | ICD-10-CM | POA: Insufficient documentation

## 2017-03-31 LAB — URINALYSIS, ROUTINE W REFLEX MICROSCOPIC
Bilirubin Urine: NEGATIVE
Glucose, UA: NEGATIVE mg/dL
Hgb urine dipstick: NEGATIVE
Ketones, ur: NEGATIVE mg/dL
Leukocytes, UA: NEGATIVE
Nitrite: NEGATIVE
Protein, ur: NEGATIVE mg/dL
Specific Gravity, Urine: 1.014 (ref 1.005–1.030)
pH: 5 (ref 5.0–8.0)

## 2017-03-31 LAB — CBC WITH DIFFERENTIAL/PLATELET
Basophils Absolute: 0 10*3/uL (ref 0.0–0.1)
Basophils Relative: 0 %
Eosinophils Absolute: 0.1 10*3/uL (ref 0.0–0.7)
Eosinophils Relative: 1 %
HCT: 40.7 % (ref 39.0–52.0)
Hemoglobin: 14.1 g/dL (ref 13.0–17.0)
Lymphocytes Relative: 18 %
Lymphs Abs: 1.1 10*3/uL (ref 0.7–4.0)
MCH: 31.4 pg (ref 26.0–34.0)
MCHC: 34.6 g/dL (ref 30.0–36.0)
MCV: 90.6 fL (ref 78.0–100.0)
Monocytes Absolute: 0.4 10*3/uL (ref 0.1–1.0)
Monocytes Relative: 7 %
Neutro Abs: 4.7 10*3/uL (ref 1.7–7.7)
Neutrophils Relative %: 74 %
Platelets: 197 10*3/uL (ref 150–400)
RBC: 4.49 MIL/uL (ref 4.22–5.81)
RDW: 12.9 % (ref 11.5–15.5)
WBC: 6.4 10*3/uL (ref 4.0–10.5)

## 2017-03-31 LAB — COMPREHENSIVE METABOLIC PANEL
ALT: 18 U/L (ref 17–63)
AST: 19 U/L (ref 15–41)
Albumin: 3.6 g/dL (ref 3.5–5.0)
Alkaline Phosphatase: 94 U/L (ref 38–126)
Anion gap: 10 (ref 5–15)
BUN: 16 mg/dL (ref 6–20)
CO2: 26 mmol/L (ref 22–32)
Calcium: 9 mg/dL (ref 8.9–10.3)
Chloride: 102 mmol/L (ref 101–111)
Creatinine, Ser: 0.76 mg/dL (ref 0.61–1.24)
GFR calc Af Amer: >60 mL/min (ref >60)
GFR calc non Af Amer: >60 mL/min (ref >60)
Glucose, Bld: 169 mg/dL — ABNORMAL HIGH (ref 65–99)
Potassium: 4 mmol/L (ref 3.5–5.1)
Sodium: 138 mmol/L (ref 135–145)
Total Bilirubin: 0.7 mg/dL (ref 0.3–1.2)
Total Protein: 6.1 g/dL — ABNORMAL LOW (ref 6.5–8.1)

## 2017-03-31 MED ORDER — MECLIZINE HCL 25 MG PO TABS
25.0000 mg | ORAL_TABLET | Freq: Once | ORAL | Status: DC
  Filled 2017-03-31: qty 1

## 2017-03-31 MED ORDER — SODIUM CHLORIDE 0.9 % IV BOLUS (SEPSIS)
1000.0000 mL | Freq: Once | INTRAVENOUS | Status: AC
  Administered 2017-03-31: 1000 mL via INTRAVENOUS

## 2017-03-31 NOTE — Discharge Instructions (Signed)
Return here as needed.  Follow-up with your primary care doctor.  Increase your fluid intake as much as possible.

## 2017-03-31 NOTE — ED Provider Notes (Signed)
[place] DEPT Provider Note   CSN: [id number] Arrival date & time: 03/31/17  1058     History   Chief Complaint Chief Complaint  Patient presents with  . Dizziness    HPI Ivan Anderson is a 76 y.o. male.  HPI Patient presents to the emergency department with dizziness that started yesterday the patient has a history of vertigo and takes meclizine on a regular basis.  Patient states that he took some last night.  And then again this morning which helped somewhat but then he went to church and felt dizzy and like he might fall.  Patient states that nothing seems to make the condition better activity seem to make the condition worse.  The patient denies chest pain, shortness of breath, headache,blurred vision, neck pain, fever, cough, weakness, numbness,  anorexia, edema, abdominal pain, nausea, vomiting, diarrhea, rash, back pain, dysuria, hematemesis, bloody stool,or syncope. Past Medical History:  Diagnosis Date  . Anxiety   . Atrial fibrillation, new onset ([place]) 03/27/2015  . CAD (coronary artery disease)   . Colon polyp    Cscope at [facility] aprox 2006, repeated 2010 (-), next 5 years  . Diabetes mellitus with neuropathy ([place])   . Diaphragm paralysis    R, per pt   . DJD (degenerative joint disease)   . Erectile dysfunction   . Eye muscle paralysis    congenital, lateral rectus, left  . Heart palpitations   . Hemorrhoids   . Hyperlipidemia   . Microscopic hematuria 2017   seeing urology  . Mild cognitive disorder    Unspecified mild NCD  . Skin cancer    sees derm    Patient Active Problem List   Diagnosis Date Noted  . Gait disorder 02/14/2017  . Lower urinary tract symptoms (LUTS) 02/14/2017  . HLD (hyperlipidemia) 07/17/2016  . GERD (gastroesophageal reflux disease) 07/17/2016  . Depression 07/17/2016  . Chronic diastolic CHF (congestive heart failure) ([place]) 07/17/2016  . Bradycardia 06/15/2016  . PCP NOTES >>>>>> 03/29/2015  .  Paroxysmal atrial fibrillation ([place]) 03/27/2015  . 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 TRIGGER FINGER, LEFT MIDDLE 06/08/2010  . HAND PAIN, LEFT 06/08/2010  . ANXIETY 12/19/2006  . 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 Dyslipidemia 10/01/2006  . Diabetes ([place]) 05/04/2006  . ERECTILE DYSFUNCTION 05/04/2006  . Essential hypertension 05/04/2006  . Coronary atherosclerosis 05/04/2006  . OSTEOARTHRITIS 05/04/2006  . COLONIC POLYPS, HX OF 05/04/2006    Past Surgical History:  Procedure Laterality Date  . ANKLE FUSION Right   . APPENDECTOMY    . CARPAL TUNNEL RELEASE     B, 2000  . CYSTOSCOPY  2017  . HIP SURGERY Right 1964   pins  . HIP SURGERY  2009   RIGHT--s/p hip replacement, s/p revision August 2009 due to dislocation x 3   . HIP SURGERY  04-2012, 2017   R hip revision  . KNEE SURGERY Left 2005  . MEDIAL PARTIAL KNEE REPLACEMENT Right 08-2012  . TOTAL HIP ARTHROPLASTY  08-09-15   [facility]       Home Medications    Prior to Admission medications   Medication Sig Start Date End Date Taking? Authorizing Provider  acetaminophen (TYLENOL) 500 MG tablet Take 1,000 mg by mouth every 6 (six) hours as needed for moderate pain.    Yes [provider]  apixaban (ELIQUIS) 5 MG TABS tablet Take 1 tablet (5 mg total) by mouth  2 (two) times daily. 11/07/16  Yes Paz, Alda Berthold, MD  buPROPion (WELLBUTRIN) 75 MG tablet Take 1 tablet (75 mg total) by mouth 2 (two) times daily. 05/25/16  Yes Paz, Alda Berthold, MD  Cinnamon 500 MG capsule Take 1,000 mg by mouth 2 (two) times daily.    Yes [provider]  insulin glargine (LANTUS) 100 UNIT/ML injection Inject 5 Units into the skin at bedtime.   Yes [provider]  loratadine (CLARITIN) 10 MG tablet Take 10 mg by mouth every morning.  12/02/14  Yes Colon Branch, MD  meclizine (ANTIVERT) 12.5 MG tablet Take 1 tablet (12.5 mg total) by mouth 2 (two) times daily as  needed for dizziness. 03/29/17  Yes Paz, Alda Berthold, MD  memantine (NAMENDA) 10 MG tablet Take 1 tablet (10 mg total) 2 (two) times daily by mouth. 02/12/17  Yes Tat, Eustace Quail, DO  metFORMIN (GLUCOPHAGE) 1000 MG tablet Take 1 tablet (1,000 mg total) by mouth 2 (two) times daily with a meal. 12/02/14  Yes Colon Branch, MD  Multiple Vitamin (MULTIVITAMIN) tablet Take 1 tablet by mouth every morning.  08/27/12  Yes [provider]  omega-3 acid ethyl esters (LOVAZA) 1 g capsule Take 2 g by mouth 2 (two) times daily.    Yes [provider]  omeprazole (PRILOSEC) 20 MG capsule Take 1 capsule (20 mg total) by mouth daily. 12/02/14  Yes Paz, Alda Berthold, MD  polyethylene glycol [facility] Floria Raveling) packet Take 17 g by mouth as needed for mild constipation.   Yes [provider]  polyvinyl alcohol (LIQUIFILM TEARS) 1.4 % ophthalmic solution Place 1 drop into both eyes 3 (three) times daily as needed for dry eyes.   Yes [provider]  simvastatin (ZOCOR) 40 MG tablet Take 40 mg by mouth daily.    Yes [provider]  tamsulosin (FLOMAX) 0.4 MG CAPS capsule Take 1 capsule (0.4 mg total) by mouth at bedtime. 01/14/17  Yes Paz, Alda Berthold, MD  memantine [facility] TITRATION PAK) tablet pack 5 mg/day for =1 week; 5 mg twice daily for =1 week; 15 mg/day given in 5 mg and 10 mg separated doses for =1 week; then 10 mg twice daily Patient not taking: Reported on 03/31/2017 02/12/17   Tat, Eustace Quail, DO    Family History Family History  Problem Relation Age of Onset  . Dementia Father   . Emphysema Father   . Diabetes Maternal Grandmother   . Heart disease Sister   . Stroke Sister   . Heart attack Neg Hx   . Hypertension Neg Hx     Social History Social History   Tobacco Use  . Smoking status: Former Smoker    Last attempt to quit: 01/02/1981    Years since quitting: 36.2  . Smokeless tobacco: Never Used  . Tobacco comment: quit 1980s  Substance Use Topics  . Alcohol use:  No    Alcohol/week: 0.0 oz  . Drug use: No     Allergies   Pseudoephedrine   Review of Systems Review of Systems  All other systems negative except as documented in the HPI. All pertinent positives and negatives as reviewed in the HPI. Physical Exam Updated Vital Signs BP (!) 143/72   Pulse 71   Temp (!) 97.4 F (36.3 C) (Oral)   Resp 18   Ht 6\' 6"  (1.981 m)   Wt 104.8 kg (231 lb)   SpO2 98%   BMI 26.69 kg/m  Physical Exam  Constitutional: He is oriented to person, place, and time. He appears well-developed and well-nourished. No distress.  HENT:  Head: Normocephalic and atraumatic.  Mouth/Throat: Oropharynx is clear and moist.  Eyes: Pupils are equal, round, and reactive to light.  Neck: Normal range of motion. Neck supple.  Cardiovascular: Normal rate, regular rhythm and normal heart sounds. Exam reveals no gallop and no friction rub.  No murmur heard. Pulmonary/Chest: Effort normal and breath sounds normal. No respiratory distress. He has no wheezes.  Abdominal: Soft. Bowel sounds are normal. He exhibits no distension. There is no tenderness.  Neurological: He is alert and oriented to person, place, and time. He exhibits normal muscle tone. Coordination normal.  Finger to nose testing did not show any abnormalities.  Patient was ambulated after IV fluids and ambulated without difficulty  Skin: Skin is warm and dry. Capillary refill takes less than 2 seconds. No rash noted. No erythema.  Psychiatric: He has a normal mood and affect. His behavior is normal.  Nursing note and vitals reviewed.    ED Treatments / Results  Labs (all labs ordered are listed, but only abnormal results are displayed) Labs Reviewed  COMPREHENSIVE METABOLIC PANEL - Abnormal; Notable for the following components:      Result Value   Glucose, Bld 169 (*)    Total Protein 6.1 (*)    All other components within normal limits  CBC WITH DIFFERENTIAL/PLATELET  URINALYSIS, ROUTINE W REFLEX  MICROSCOPIC    EKG  EKG Interpretation  Date/Time:  Sunday March 31 2017 11:17:42 EST Ventricular Rate:  67 PR Interval:    QRS Duration: 104 QT Interval:  412 QTC Calculation: 435 R Axis:   63 Text Interpretation:  Sinus rhythm Prolonged PR interval Baseline wander in lead(s) I III aVR aVL similar to previous EKG wandering baseline technically difficult Confirmed by Brantley Stage [id number]) on 03/31/2017 1:30:46 PM       Radiology No results found.  Procedures Procedures (including critical care time)  Medications Ordered in ED Medications  sodium chloride 0.9 % bolus 1,000 mL (0 mLs Intravenous Stopped 03/31/17 1405)     Initial Impression / Assessment and Plan / ED Course  I have reviewed the triage vital signs and the nursing notes.  Pertinent labs & imaging results that were available during my care of the patient were reviewed by me and considered in my medical decision making (see chart for details).     Patient has resolution of his symptoms following IV fluids.  The patient was able to ambulate without difficulty patient did not take the meclizine because he states that at this time he does not have any symptoms.  I have advised him to follow-up with his primary doctor.  Told return here for any worsening in his condition the patient is not orthostatic at this time.  Upon reviewing his chart there was some consideration that he may have orthostatic hypotension.  Final Clinical Impressions(s) / ED Diagnoses   Final diagnoses:  None    ED Discharge Orders    None       Dalia Heading, PA-C 03/31/17 1452    Dalia Heading, PA-C 03/31/17 1459    Forde Dandy, MD 03/31/17 [phone]

## 2017-03-31 NOTE — ED Notes (Signed)
Bed: WA07 Expected date:  Expected time:  Means of arrival:  Comments: 

## 2017-03-31 NOTE — ED Triage Notes (Signed)
Patient c/o dizziness with movement since yesterday but got worse today when going to church. Patient adds that while hitting bumps driving over here causing his vision "to not be good."

## 2017-05-28 ENCOUNTER — Other Ambulatory Visit: Payer: Self-pay

## 2017-05-28 ENCOUNTER — Emergency Department (HOSPITAL_COMMUNITY): Payer: Medicare Other

## 2017-05-28 ENCOUNTER — Emergency Department (HOSPITAL_COMMUNITY)
Admission: EM | Admit: 2017-05-28 | Discharge: 2017-05-28 | Disposition: A | Discharge status: Home / Self Care | Payer: Medicare Other | Attending: Emergency Medicine | Admitting: Emergency Medicine

## 2017-05-28 ENCOUNTER — Encounter (HOSPITAL_COMMUNITY): Payer: Self-pay | Admitting: Emergency Medicine

## 2017-05-28 DIAGNOSIS — F0390 Unspecified dementia without behavioral disturbance: Secondary | ICD-10-CM | POA: Diagnosis not present

## 2017-05-28 DIAGNOSIS — M79644 Pain in right finger(s): Secondary | ICD-10-CM | POA: Diagnosis not present

## 2017-05-28 DIAGNOSIS — Y929 Unspecified place or not applicable: Secondary | ICD-10-CM | POA: Insufficient documentation

## 2017-05-28 DIAGNOSIS — G501 Atypical facial pain: Secondary | ICD-10-CM | POA: Diagnosis not present

## 2017-05-28 DIAGNOSIS — R42 Dizziness and giddiness: Secondary | ICD-10-CM | POA: Diagnosis not present

## 2017-05-28 DIAGNOSIS — R040 Epistaxis: Secondary | ICD-10-CM | POA: Diagnosis not present

## 2017-05-28 DIAGNOSIS — T148XXA Other injury of unspecified body region, initial encounter: Secondary | ICD-10-CM

## 2017-05-28 DIAGNOSIS — Z87891 Personal history of nicotine dependence: Secondary | ICD-10-CM | POA: Insufficient documentation

## 2017-05-28 DIAGNOSIS — S63511A Sprain of carpal joint of right wrist, initial encounter: Secondary | ICD-10-CM | POA: Diagnosis not present

## 2017-05-28 DIAGNOSIS — M25531 Pain in right wrist: Secondary | ICD-10-CM | POA: Diagnosis not present

## 2017-05-28 DIAGNOSIS — S199XXA Unspecified injury of neck, initial encounter: Secondary | ICD-10-CM | POA: Diagnosis not present

## 2017-05-28 DIAGNOSIS — S6991XA Unspecified injury of right wrist, hand and finger(s), initial encounter: Secondary | ICD-10-CM

## 2017-05-28 DIAGNOSIS — Y93K1 Activity, walking an animal: Secondary | ICD-10-CM | POA: Insufficient documentation

## 2017-05-28 DIAGNOSIS — S0003XA Contusion of scalp, initial encounter: Secondary | ICD-10-CM | POA: Diagnosis not present

## 2017-05-28 DIAGNOSIS — S0993XA Unspecified injury of face, initial encounter: Secondary | ICD-10-CM | POA: Diagnosis not present

## 2017-05-28 DIAGNOSIS — S0081XA Abrasion of other part of head, initial encounter: Secondary | ICD-10-CM | POA: Diagnosis not present

## 2017-05-28 DIAGNOSIS — W19XXXA Unspecified fall, initial encounter: Secondary | ICD-10-CM

## 2017-05-28 DIAGNOSIS — S60512A Abrasion of left hand, initial encounter: Secondary | ICD-10-CM | POA: Diagnosis not present

## 2017-05-28 DIAGNOSIS — W01198A Fall on same level from slipping, tripping and stumbling with subsequent striking against other object, initial encounter: Secondary | ICD-10-CM | POA: Insufficient documentation

## 2017-05-28 DIAGNOSIS — I5032 Chronic diastolic (congestive) heart failure: Secondary | ICD-10-CM | POA: Diagnosis not present

## 2017-05-28 DIAGNOSIS — S0083XA Contusion of other part of head, initial encounter: Secondary | ICD-10-CM | POA: Insufficient documentation

## 2017-05-28 DIAGNOSIS — I2510 Atherosclerotic heart disease of native coronary artery without angina pectoris: Secondary | ICD-10-CM | POA: Insufficient documentation

## 2017-05-28 DIAGNOSIS — M79642 Pain in left hand: Secondary | ICD-10-CM | POA: Diagnosis not present

## 2017-05-28 DIAGNOSIS — I110 Hypertensive heart disease with heart failure: Secondary | ICD-10-CM | POA: Diagnosis not present

## 2017-05-28 DIAGNOSIS — S6992XA Unspecified injury of left wrist, hand and finger(s), initial encounter: Secondary | ICD-10-CM | POA: Diagnosis not present

## 2017-05-28 DIAGNOSIS — Y998 Other external cause status: Secondary | ICD-10-CM | POA: Insufficient documentation

## 2017-05-28 DIAGNOSIS — S0180XA Unspecified open wound of other part of head, initial encounter: Secondary | ICD-10-CM | POA: Diagnosis not present

## 2017-05-28 DIAGNOSIS — S098XXA Other specified injuries of head, initial encounter: Secondary | ICD-10-CM | POA: Diagnosis not present

## 2017-05-28 HISTORY — DX: Unspecified dementia, unspecified severity, without behavioral disturbance, psychotic disturbance, mood disturbance, and anxiety: F03.90

## 2017-05-28 LAB — CBG MONITORING, ED: GLUCOSE-CAPILLARY: 139 mg/dL — AB (ref 65–99)

## 2017-05-28 MED ORDER — BACITRACIN ZINC 500 UNIT/GM EX OINT: 1.0000 "application " | TOPICAL_OINTMENT | Freq: Two times a day (BID) | CUTANEOUS | 0 refills | Status: DC

## 2017-05-28 MED ORDER — BACITRACIN ZINC 500 UNIT/GM EX OINT: TOPICAL_OINTMENT | Freq: Two times a day (BID) | CUTANEOUS | Status: DC

## 2017-05-28 NOTE — Progress Notes (Signed)
Orthopedic Tech Progress Note Patient Details:  Ivan Anderson 1940/08/14 [id number]  Ortho Devices Type of Ortho Device: Thumb velcro splint Ortho Device/Splint Location: Right/   Thumb velcro spica splint applied Ortho Device/Splint Interventions: Application, Adjustment   Post Interventions Patient Tolerated: Well Instructions Provided: Adjustment of device, Care of device   [name] 05/28/2017, 7:27 PM

## 2017-05-28 NOTE — ED Notes (Signed)
Patient transported to CT 

## 2017-05-28 NOTE — ED Notes (Signed)
Gave pt graham crackers and Diet Coke, per Sophia - PA.

## 2017-05-28 NOTE — Discharge Instructions (Signed)
Continue taking all your medications as prescribed. Keep the cuts clean.  You may apply antibiotic ointment twice a day. Use ice for pain and swelling. Follow-up with Dr. Amedeo Plenty, the hand doctor, for further management of your wrist. Follow-up with your primary care doctor as needed.  Return to the emergency room if you develop vision changes, slurred speech, vomiting, numbness of your hand, or any new or concerning symptoms.

## 2017-05-28 NOTE — ED Triage Notes (Signed)
Patient arrived via [facility] from Home with a fall. He reports falling on his right side.Knuckels and right side face affected.  He reports Eliquis  For AFIB. Patient walks with cane but he did not have it at the time of fall.

## 2017-05-28 NOTE — ED Provider Notes (Signed)
[facility] EMERGENCY DEPARTMENT Provider Note   CSN: [id number] Arrival date & time: 05/28/17  1454     History   Chief Complaint Chief Complaint  Patient presents with  . Fall    HPI Ivan Anderson is a 77 y.o. male presenting for evaluation after a fall.  Patient states he was walking the dog when he bent down to pick 7 up from the ground, and fell forward, hitting his face and his hands.  He denies loss of consciousness.  He was unable to stand up on his own, needed help from EMS.  He has not ambulated since.  He is on Eliquis.  He had epistaxis immediately following the fall, but has not had any since.  He denies vision changes, slurred speech, nausea, vomiting, numbness, tingling, or loss of bowel or bladder control.  He reports pain of the right side of his head, surrounding his eye where he landed, and pain of bilateral hands.  He denies pain in his neck, back, chest, abdomen, or lower extremities.   HPI  Past Medical History:  Diagnosis Date  . Anxiety   . Atrial fibrillation, new onset ([place]) 03/27/2015  . CAD (coronary artery disease)   . Colon polyp    Cscope at [facility] aprox 2006, repeated 2010 (-), next 5 years  . Dementia    per family diagnosed Aug-2016  . Diabetes mellitus with neuropathy ([place])   . Diaphragm paralysis    R, per pt   . DJD (degenerative joint disease)   . Erectile dysfunction   . Eye muscle paralysis    congenital, lateral rectus, left  . Heart palpitations   . Hemorrhoids   . Hyperlipidemia   . Microscopic hematuria 2017   seeing urology  . Mild cognitive disorder    Unspecified mild NCD  . Skin cancer    sees derm    Patient Active Problem List   Diagnosis Date Noted  . Gait disorder 02/14/2017  . Lower urinary tract symptoms (LUTS) 02/14/2017  . HLD (hyperlipidemia) 07/17/2016  . GERD (gastroesophageal reflux disease) 07/17/2016  . Depression 07/17/2016  . Chronic diastolic CHF (congestive heart failure) ([place])  07/17/2016  . Bradycardia 06/15/2016  . PCP NOTES >>>>>> 03/29/2015  . Paroxysmal atrial fibrillation ([place]) 03/27/2015  . 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 TRIGGER FINGER, LEFT MIDDLE 06/08/2010  . HAND PAIN, LEFT 06/08/2010  . ANXIETY 12/19/2006  . 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 Dyslipidemia 10/01/2006  . Diabetes ([place]) 05/04/2006  . ERECTILE DYSFUNCTION 05/04/2006  . Essential hypertension 05/04/2006  . Coronary atherosclerosis 05/04/2006  . OSTEOARTHRITIS 05/04/2006  . COLONIC POLYPS, HX OF 05/04/2006    Past Surgical History:  Procedure Laterality Date  . ANKLE FUSION Right   . APPENDECTOMY    . CARPAL TUNNEL RELEASE     B, 2000  . CYSTOSCOPY  2017  . HIP SURGERY Right 1964   pins  . HIP SURGERY  2009   RIGHT--s/p hip replacement, s/p revision August 2009 due to dislocation x 3   . HIP SURGERY  04-2012, 2017   R hip revision  . KNEE SURGERY Left 2005  . MEDIAL PARTIAL KNEE REPLACEMENT Right 08-2012  . TOTAL HIP ARTHROPLASTY  08-09-15   [facility]       Home Medications    Prior to Admission medications   Medication Sig Start Date End Date Taking? Authorizing Provider  acetaminophen (TYLENOL) 500 MG  tablet Take 1,000 mg by mouth every 6 (six) hours as needed for moderate pain.    Yes [provider]  apixaban (ELIQUIS) 5 MG TABS tablet Take 1 tablet (5 mg total) by mouth 2 (two) times daily. 11/07/16  Yes Paz, Alda Berthold, MD  buPROPion (WELLBUTRIN) 75 MG tablet Take 1 tablet (75 mg total) by mouth 2 (two) times daily. 05/25/16  Yes Paz, Alda Berthold, MD  Cinnamon 500 MG capsule Take 1,000 mg by mouth 2 (two) times daily.    Yes [provider]  insulin glargine (LANTUS) 100 UNIT/ML injection Inject 5 Units into the skin at bedtime.   Yes [provider]  loratadine (CLARITIN) 10 MG tablet Take 10 mg by mouth every morning.  12/02/14  Yes Colon Branch, MD  meclizine (ANTIVERT) 12.5  MG tablet Take 1 tablet (12.5 mg total) by mouth 2 (two) times daily as needed for dizziness. 03/29/17  Yes Paz, Alda Berthold, MD  memantine (NAMENDA) 10 MG tablet Take 1 tablet (10 mg total) 2 (two) times daily by mouth. 02/12/17  Yes Tat, Eustace Quail, DO  metFORMIN (GLUCOPHAGE) 1000 MG tablet Take 1 tablet (1,000 mg total) by mouth 2 (two) times daily with a meal. 12/02/14  Yes Colon Branch, MD  Multiple Vitamin (MULTIVITAMIN) tablet Take 1 tablet by mouth every morning.  08/27/12  Yes [provider]  omega-3 acid ethyl esters (LOVAZA) 1 g capsule Take 2 g by mouth 2 (two) times daily.    Yes [provider]  omeprazole (PRILOSEC) 20 MG capsule Take 1 capsule (20 mg total) by mouth daily. 12/02/14  Yes Paz, Alda Berthold, MD  polyethylene glycol [facility] / Floria Raveling) packet Take 17 g by mouth as needed for mild constipation.   Yes [provider]  polyvinyl alcohol (LIQUIFILM TEARS) 1.4 % ophthalmic solution Place 1 drop into both eyes 3 (three) times daily as needed for dry eyes.   Yes [provider]  simvastatin (ZOCOR) 40 MG tablet Take 40 mg by mouth daily.    Yes [provider]  tamsulosin (FLOMAX) 0.4 MG CAPS capsule Take 1 capsule (0.4 mg total) by mouth at bedtime. 01/14/17  Yes Paz, Alda Berthold, MD  bacitracin ointment Apply 1 application topically 2 (two) times daily. 05/28/17   Brianni Manthe, PA-C  memantine (NAMENDA TITRATION PAK) tablet pack 5 mg/day for =1 week; 5 mg twice daily for =1 week; 15 mg/day given in 5 mg and 10 mg separated doses for =1 week; then 10 mg twice daily Patient not taking: Reported on 03/31/2017 02/12/17   Tat, Eustace Quail, DO    Family History Family History  Problem Relation Age of Onset  . Dementia Father   . Emphysema Father   . Diabetes Maternal Grandmother   . Heart disease Sister   . Stroke Sister   . Heart attack Neg Hx   . Hypertension Neg Hx     Social History Social History   Tobacco Use  . Smoking status: Former  Smoker    Last attempt to quit: 01/02/1981    Years since quitting: 36.4  . Smokeless tobacco: Never Used  . Tobacco comment: quit 1980s  Substance Use Topics  . Alcohol use: No    Alcohol/week: 0.0 oz  . Drug use: No     Allergies   Pseudoephedrine   Review of Systems Review of Systems  HENT: Positive for facial swelling and nosebleeds (resolved).   Hematological: Bruises/bleeds easily.  All other  systems reviewed and are negative.    Physical Exam Updated Vital Signs BP (!) 152/77 (BP Location: Left Arm)   Pulse (!) 53   Temp 97.9 F (36.6 C) (Oral)   Resp 17   Ht 6\' 6"  (1.981 m)   Wt 95.3 kg (210 lb)   SpO2 99%   BMI 24.27 kg/m   Physical Exam  Constitutional: He is oriented to person, place, and time. He appears well-developed and well-nourished. No distress.  HENT:  Head: Normocephalic. Head is with abrasion.  Right Ear: Tympanic membrane, external ear and ear canal normal. No hemotympanum.  Left Ear: Tympanic membrane, external ear and ear canal normal. No hemotympanum.  Nose: Sinus tenderness present. No nasal septal hematoma.  Mouth/Throat: Uvula is midline, oropharynx is clear and moist and mucous membranes are normal.  Visible swelling of right periorbital tissues with abrasion of the right forehead.  No epistaxis or drainage from the ears.  Tenderness of the nasal bridge.  No nasal septal hematoma or hemotympanum.  No pain with movement of the jaw/mandible.   Eyes: EOM are normal. Pupils are equal, round, and reactive to light.  No pain with movement of the eyes.  No lateral movement of the left eye, baseline per patient.  Neck: Normal range of motion. Neck supple.  Full ROM of head and neck without pain. No TTP of midline c-spine   Cardiovascular: Normal rate, regular rhythm and intact distal pulses.  Pulmonary/Chest: Effort normal and breath sounds normal. He exhibits no tenderness.  No TTP of the chest wall  Abdominal: Soft. He exhibits no distension.  There is no tenderness.  No TTP of the abd.  Musculoskeletal: He exhibits tenderness.  Tenderness palpation of dorsal left hand with abrasions of the dorsal left hand.  Full active range of motion of the wrist and fingers.  Radial pulses intact bilaterally.  No active bleeding.  Strength against resistance of finger is intact.  Tenderness palpation of the right distal thumb and pain at the right anatomic snuffbox.  Full active range of motion of the thumb with back pain.  No pain of the other fingers of the right hand. No pain or obvious injury of the lower extremities.  Neurological: He is alert and oriented to person, place, and time. He has normal strength. No cranial nerve deficit or sensory deficit. GCS eye subscore is 4. GCS verbal subscore is 5. GCS motor subscore is 6.  Fine movement and coordination intact  Skin: Skin is warm.  Psychiatric: He has a normal mood and affect.  Nursing note and vitals reviewed.    ED Treatments / Results  Labs (all labs ordered are listed, but only abnormal results are displayed) Labs Reviewed  CBG MONITORING, ED - Abnormal; Notable for the following components:      Result Value   Glucose-Capillary 139 (*)    All other components within normal limits    EKG  EKG Interpretation  Date/Time:  Tuesday May 28 2017 14:57:45 EST Ventricular Rate:  60 PR Interval:    QRS Duration: 107 QT Interval:  420 QTC Calculation: 420 R Axis:   38 Text Interpretation:  Sinus rhythm Prolonged PR interval Borderline low voltage, extremity leads When compared to prior, no significant changes seen.  No STEMI Confirmed by Antony Blackbird [phone]) on 05/28/2017 4:33:27 PM       Radiology Dg Wrist Complete Right  Result Date: 05/28/2017 CLINICAL DATA:  Right wrist pain after fall. EXAM: RIGHT WRIST - COMPLETE 3+ VIEW  COMPARISON:  None. FINDINGS: No acute fracture or dislocation. Widening of the scapholunate interval. Mild scaphotrapeziotrapezoid and first CMC  joint osteoarthritis. Diffuse osteopenia. Mild soft tissue swelling about the wrist. Vascular calcifications. IMPRESSION: 1. Findings consistent with scapholunate ligament injury and dissociation, age indeterminate. No fracture or dislocation. Electronically Signed   By: Titus Dubin M.D.   On: 05/28/2017 18:12   Ct Head Wo Contrast  Result Date: 05/28/2017 CLINICAL DATA:  Pt fell after leaning forward and got dizzy Hit concrete face first HX DM, dementia, CAD, afib EXAM: CT HEAD WITHOUT CONTRAST CT MAXILLOFACIAL WITHOUT CONTRAST CT CERVICAL SPINE WITHOUT CONTRAST TECHNIQUE: Multidetector CT imaging of the head, cervical spine, and maxillofacial structures were performed using the standard protocol without intravenous contrast. Multiplanar CT image reconstructions of the cervical spine and maxillofacial structures were also generated. COMPARISON:  Brain MRI, 12/17/2016.  Head CT, 07/17/2016. FINDINGS: CT HEAD FINDINGS Brain: No evidence of acute infarction, hemorrhage, hydrocephalus, extra-axial collection or mass lesion/mass effect. There is ventricular and sulcal enlargement reflecting mild to moderate diffuse atrophy similar to the prior studies. Patchy white matter hypoattenuation is also noted consistent with moderate chronic microvascular ischemic change. Vascular: No hyperdense vessel or unexpected calcification. Skull: Normal. Negative for fracture or focal lesion. Other: Right frontal scalp hematoma. CT MAXILLOFACIAL FINDINGS Osseous: No fracture or mandibular dislocation. No destructive process. Orbits: Negative. No traumatic or inflammatory finding. Sinuses: Clear. Soft tissues: Right forehead scalp hematoma extends to the superior medial, preseptal right periorbital soft tissues. No soft tissue masses. No adenopathy. CT CERVICAL SPINE FINDINGS Alignment: Straightening of the normal cervical lordosis. No spondylolisthesis. Skull base and vertebrae: No acute fracture. No primary bone lesion or focal  pathologic process. Soft tissues and spinal canal: No prevertebral fluid or swelling. No visible canal hematoma. Disc levels: There are disc degenerative changes throughout the cervical spine with loss of disc height and endplate spurring. There is right greater than left facet degenerative change at C2-C3. No convincing disc herniation. Upper chest: No masses or adenopathy.  Clear lung apices. Other: None. IMPRESSION: HEAD CT 1. No acute intracranial abnormalities. 2. No skull fracture. 3. Right frontal scalp hematoma. 4. Atrophy and chronic microvascular ischemic change. MAXILLOFACIAL CT 1. No fracture or acute finding. CERVICAL CT 1. No fracture or acute finding. Electronically Signed   By: Lajean Manes M.D.   On: 05/28/2017 17:12   Ct Cervical Spine Wo Contrast  Result Date: 05/28/2017 CLINICAL DATA:  Pt fell after leaning forward and got dizzy Hit concrete face first HX DM, dementia, CAD, afib EXAM: CT HEAD WITHOUT CONTRAST CT MAXILLOFACIAL WITHOUT CONTRAST CT CERVICAL SPINE WITHOUT CONTRAST TECHNIQUE: Multidetector CT imaging of the head, cervical spine, and maxillofacial structures were performed using the standard protocol without intravenous contrast. Multiplanar CT image reconstructions of the cervical spine and maxillofacial structures were also generated. COMPARISON:  Brain MRI, 12/17/2016.  Head CT, 07/17/2016. FINDINGS: CT HEAD FINDINGS Brain: No evidence of acute infarction, hemorrhage, hydrocephalus, extra-axial collection or mass lesion/mass effect. There is ventricular and sulcal enlargement reflecting mild to moderate diffuse atrophy similar to the prior studies. Patchy white matter hypoattenuation is also noted consistent with moderate chronic microvascular ischemic change. Vascular: No hyperdense vessel or unexpected calcification. Skull: Normal. Negative for fracture or focal lesion. Other: Right frontal scalp hematoma. CT MAXILLOFACIAL FINDINGS Osseous: No fracture or mandibular  dislocation. No destructive process. Orbits: Negative. No traumatic or inflammatory finding. Sinuses: Clear. Soft tissues: Right forehead scalp hematoma extends to the superior medial, preseptal right periorbital  soft tissues. No soft tissue masses. No adenopathy. CT CERVICAL SPINE FINDINGS Alignment: Straightening of the normal cervical lordosis. No spondylolisthesis. Skull base and vertebrae: No acute fracture. No primary bone lesion or focal pathologic process. Soft tissues and spinal canal: No prevertebral fluid or swelling. No visible canal hematoma. Disc levels: There are disc degenerative changes throughout the cervical spine with loss of disc height and endplate spurring. There is right greater than left facet degenerative change at C2-C3. No convincing disc herniation. Upper chest: No masses or adenopathy.  Clear lung apices. Other: None. IMPRESSION: HEAD CT 1. No acute intracranial abnormalities. 2. No skull fracture. 3. Right frontal scalp hematoma. 4. Atrophy and chronic microvascular ischemic change. MAXILLOFACIAL CT 1. No fracture or acute finding. CERVICAL CT 1. No fracture or acute finding. Electronically Signed   By: Lajean Manes M.D.   On: 05/28/2017 17:12   Dg Hand Complete Left  Result Date: 05/28/2017 CLINICAL DATA:  Pain following fall EXAM: LEFT HAND - COMPLETE 3+ VIEW COMPARISON:  None. FINDINGS: Frontal, oblique, and lateral views were obtained. There is evidence of old trauma involving the distal aspect of the second distal phalanx with remodeling. Note that there is a small focus of calcification distal to the second distal phalanx, felt to represent residua of old trauma. No acute fracture or dislocation is evident. There is narrowing of all PIP and DIP joints as well as the first and second MCP joints. There is also moderate osteoarthritic change in the scaphotrapezial joint. No erosive change. There is calcification in the radial artery. IMPRESSION: 1.  Old trauma with remodeling  distal aspect second digit. 2.  No evident acute fracture or dislocation. 3.  Multifocal osteoarthritic change. 4.  Atherosclerotic calcification radial artery. Electronically Signed   By: Lowella Grip III M.D.   On: 05/28/2017 16:48   Dg Finger Thumb Right  Result Date: 05/28/2017 CLINICAL DATA:  Pain following fall EXAM: RIGHT THUMB 2+V COMPARISON:  None. FINDINGS: Frontal, oblique, and lateral views were obtained. There is no evident acute fracture or dislocation. There is osteoarthritic change in the first carpal-metacarpal joint, first MCP joint, and first IP joint. No erosive change. There is calcification in the right radial artery. There is evidence of scapholunate disassociation on the lateral from view. IMPRESSION: Apparent scapholunate disassociation, seen on lateral view. This finding warrants appropriate clinical assessment and dedicated right wrist images. Areas of osteoarthritic change. No acute fracture or dislocation evident. There is radial artery atherosclerotic calcification. Electronically Signed   By: Lowella Grip III M.D.   On: 05/28/2017 16:52   Ct Maxillofacial Wo Contrast  Result Date: 05/28/2017 CLINICAL DATA:  Pt fell after leaning forward and got dizzy Hit concrete face first HX DM, dementia, CAD, afib EXAM: CT HEAD WITHOUT CONTRAST CT MAXILLOFACIAL WITHOUT CONTRAST CT CERVICAL SPINE WITHOUT CONTRAST TECHNIQUE: Multidetector CT imaging of the head, cervical spine, and maxillofacial structures were performed using the standard protocol without intravenous contrast. Multiplanar CT image reconstructions of the cervical spine and maxillofacial structures were also generated. COMPARISON:  Brain MRI, 12/17/2016.  Head CT, 07/17/2016. FINDINGS: CT HEAD FINDINGS Brain: No evidence of acute infarction, hemorrhage, hydrocephalus, extra-axial collection or mass lesion/mass effect. There is ventricular and sulcal enlargement reflecting mild to moderate diffuse atrophy similar to  the prior studies. Patchy white matter hypoattenuation is also noted consistent with moderate chronic microvascular ischemic change. Vascular: No hyperdense vessel or unexpected calcification. Skull: Normal. Negative for fracture or focal lesion. Other: Right frontal scalp hematoma. CT  MAXILLOFACIAL FINDINGS Osseous: No fracture or mandibular dislocation. No destructive process. Orbits: Negative. No traumatic or inflammatory finding. Sinuses: Clear. Soft tissues: Right forehead scalp hematoma extends to the superior medial, preseptal right periorbital soft tissues. No soft tissue masses. No adenopathy. CT CERVICAL SPINE FINDINGS Alignment: Straightening of the normal cervical lordosis. No spondylolisthesis. Skull base and vertebrae: No acute fracture. No primary bone lesion or focal pathologic process. Soft tissues and spinal canal: No prevertebral fluid or swelling. No visible canal hematoma. Disc levels: There are disc degenerative changes throughout the cervical spine with loss of disc height and endplate spurring. There is right greater than left facet degenerative change at C2-C3. No convincing disc herniation. Upper chest: No masses or adenopathy.  Clear lung apices. Other: None. IMPRESSION: HEAD CT 1. No acute intracranial abnormalities. 2. No skull fracture. 3. Right frontal scalp hematoma. 4. Atrophy and chronic microvascular ischemic change. MAXILLOFACIAL CT 1. No fracture or acute finding. CERVICAL CT 1. No fracture or acute finding. Electronically Signed   By: Lajean Manes M.D.   On: 05/28/2017 17:12    Procedures Procedures (including critical care time)  Medications Ordered in ED Medications  bacitracin ointment (not administered)     Initial Impression / Assessment and Plan / ED Course  I have reviewed the triage vital signs and the nursing notes.  Pertinent labs & imaging results that were available during my care of the patient were reviewed by me and considered in my medical  decision making (see chart for details).     Patient presenting for evaluation after fall.  Physical exam reassuring, no obvious neurologic deficits.  Patient with swelling of the right periorbital tissue, multiple abrasions, pain in bilateral hands.  Will obtain CT head neck and maxillofacial.  Will obtain x-ray of hands.  CT reassuring without signs of bleed, fracture, or dislocation.  Left hand x-ray without fracture dislocation.  Right hand x-ray shows possible scapholunate ligament injury, recommends right wrist for further evaluation.  Discussed case with attending, Dr. Sherry Ruffing evaluated the pt. Will obtain right wrist x-ray.  X-ray shows confirmation of scapholunate ligament injury. Consulted with hand surgery, and discussed with Dr. Griffin Basil. Pt to be placed in removable thumb spica and f/u with hand surgery in 1 week.   Patient with good cap refill and sensation post splint placement.  Discussed plan with patient and follow-up with hand surgery.  Antibiotic ointment given for abrasions, and discussed wound care.  At this time, patient appears safe for discharge.  Follow-up with primary care as needed.  Return precautions given.  Patient states he understands and agrees to plan.  Final Clinical Impressions(s) / ED Diagnoses   Final diagnoses:  Fall, initial encounter  Injury of right scapholunate ligament with no instability, initial encounter  Abrasion    ED Discharge Orders        Ordered    bacitracin ointment  2 times daily     05/28/17 [address] [place] 05/28/17 2011    Tegeler, Gwenyth Allegra, MD 05/29/17 2025

## 2017-05-28 NOTE — ED Notes (Signed)
D/c reviewed with patient and spouse 

## 2017-06-10 ENCOUNTER — Ambulatory Visit: Payer: Medicare Other | Admitting: Internal Medicine

## 2017-06-10 ENCOUNTER — Ambulatory Visit (INDEPENDENT_AMBULATORY_CARE_PROVIDER_SITE_OTHER): Payer: Medicare Other | Admitting: Internal Medicine

## 2017-06-10 ENCOUNTER — Encounter: Payer: Self-pay | Admitting: Internal Medicine

## 2017-06-10 VITALS — BP 142/62 | HR 85 | Resp 16 | Ht 78.0 in | Wt 228.0 lb

## 2017-06-10 DIAGNOSIS — S0990XA Unspecified injury of head, initial encounter: Secondary | ICD-10-CM | POA: Diagnosis not present

## 2017-06-10 DIAGNOSIS — F0150 Vascular dementia without behavioral disturbance: Secondary | ICD-10-CM

## 2017-06-10 DIAGNOSIS — W19XXXD Unspecified fall, subsequent encounter: Secondary | ICD-10-CM

## 2017-06-10 DIAGNOSIS — E785 Hyperlipidemia, unspecified: Secondary | ICD-10-CM | POA: Diagnosis not present

## 2017-06-10 DIAGNOSIS — S6991XA Unspecified injury of right wrist, hand and finger(s), initial encounter: Secondary | ICD-10-CM

## 2017-06-10 DIAGNOSIS — Z794 Long term (current) use of insulin: Secondary | ICD-10-CM | POA: Diagnosis not present

## 2017-06-10 DIAGNOSIS — E1159 Type 2 diabetes mellitus with other circulatory complications: Secondary | ICD-10-CM

## 2017-06-10 LAB — LIPID PANEL
CHOLESTEROL: 178 mg/dL (ref 0–200)
HDL: 56 mg/dL (ref >39.00)
LDL CALC: 109 mg/dL — AB (ref 0–99)
NonHDL: 122.23
Total CHOL/HDL Ratio: 3
Triglycerides: 68 mg/dL (ref 0.0–149.0)
VLDL: 13.6 mg/dL (ref 0.0–40.0)

## 2017-06-10 LAB — HEMOGLOBIN A1C: HEMOGLOBIN A1C: 7 % — AB (ref 4.6–6.5)

## 2017-06-10 NOTE — Progress Notes (Signed)
Subjective:    Patient ID: Ivan Anderson, male    DOB: 02/10/1941, 77 y.o.   MRN: [id number]  DOS:  06/10/2017 Type of visit - description : f/u Interval history: Went to the ER 05/28/2017 after an accidental fall, had a head injury, developed a hematoma, had multiple abrasions on the upper extremities. CT head, neck and maxillofacial reassuring X-rays show possibly a right scapholunate ligament injury.  Was Rx a removable thumb spica and follow-up with Ortho.  Other issues: Dementia: Good compliance with Namenda, according to the wife he is slightly worse, he had to give up his computer as he could not remember passwords etc. Weight loss?  Reports appetite is not the best.   He has also been less active lately.   Wt Readings from Last 3 Encounters:  06/10/17 228 lb (103.4 kg)  05/28/17 210 lb (95.3 kg)  03/31/17 231 lb (104.8 kg)     Review of Systems After the ER visit, he is getting better from all the injuries, no headache, double vision, nausea or vomiting. Also denies chest pain, shortness of breath, lower extremity edema or palpitations  Past Medical History:  Diagnosis Date  . Anxiety   . Atrial fibrillation, new onset ([place]) 03/27/2015  . CAD (coronary artery disease)   . Colon polyp    Cscope at [facility] aprox 2006, repeated 2010 (-), next 5 years  . Dementia    per family diagnosed Aug-2016  . Diabetes mellitus with neuropathy ([place])   . Diaphragm paralysis    R, per pt   . DJD (degenerative joint disease)   . Erectile dysfunction   . Eye muscle paralysis    congenital, lateral rectus, left  . Heart palpitations   . Hemorrhoids   . Hyperlipidemia   . Microscopic hematuria 2017   seeing urology  . Mild cognitive disorder    Unspecified mild NCD  . Skin cancer    sees derm    Past Surgical History:  Procedure Laterality Date  . ANKLE FUSION Right   . APPENDECTOMY    . CARPAL TUNNEL RELEASE     B, 2000  . CYSTOSCOPY  2017  . HIP SURGERY Right 1964   pins    . HIP SURGERY  2009   RIGHT--s/p hip replacement, s/p revision August 2009 due to dislocation x 3   . HIP SURGERY  04-2012, 2017   R hip revision  . KNEE SURGERY Left 2005  . MEDIAL PARTIAL KNEE REPLACEMENT Right 08-2012  . TOTAL HIP ARTHROPLASTY  08-09-15   [facility]    Social History   Socioeconomic History  . Marital status: Married    Spouse name: Not on file  . Number of children: Not on file  . Years of education: Not on file  . Highest education level: Not on file  Social Needs  . Financial resource strain: Not on file  . Food insecurity - worry: Not on file  . Food insecurity - inability: Not on file  . Transportation needs - medical: Not on file  . Transportation needs - non-medical: Not on file  Occupational History  . Occupation: realtor  Tobacco Use  . Smoking status: Former Smoker    Last attempt to quit: 01/02/1981    Years since quitting: 36.4  . Smokeless tobacco: Never Used  . Tobacco comment: quit 1980s  Substance and Sexual Activity  . Alcohol use: No    Alcohol/week: 0.0 oz  . Drug use: No  . Sexual  activity: Not on file  Other Topics Concern  . Not on file  Social History Narrative  . Not on file      Allergies as of 06/10/2017      Reactions   Pseudoephedrine Other (See Comments)   Dx: A. Fib.       Medication List        Accurate as of 06/10/17 11:59 PM. Always use your most recent med list.          acetaminophen 500 MG tablet Commonly known as:  TYLENOL Take 1,000 mg by mouth every 6 (six) hours as needed for moderate pain.   apixaban 5 MG Tabs tablet Commonly known as:  ELIQUIS Take 1 tablet (5 mg total) by mouth 2 (two) times daily.   bacitracin ointment Apply 1 application topically 2 (two) times daily.   buPROPion 75 MG tablet Commonly known as:  WELLBUTRIN Take 1 tablet (75 mg total) by mouth 2 (two) times daily.   Cinnamon 500 MG capsule Take 1,000 mg by mouth 2 (two) times daily.   insulin glargine 100 UNIT/ML  injection Commonly known as:  LANTUS Inject 5 Units into the skin at bedtime.   loratadine 10 MG tablet Commonly known as:  CLARITIN Take 10 mg by mouth every morning.   meclizine 12.5 MG tablet Commonly known as:  ANTIVERT Take 1 tablet (12.5 mg total) by mouth 2 (two) times daily as needed for dizziness.   memantine 10 MG tablet Commonly known as:  NAMENDA Take 1 tablet (10 mg total) 2 (two) times daily by mouth.   metFORMIN 1000 MG tablet Commonly known as:  GLUCOPHAGE Take 1 tablet (1,000 mg total) by mouth 2 (two) times daily with a meal.   multivitamin tablet Take 1 tablet by mouth every morning.   omega-3 acid ethyl esters 1 g capsule Commonly known as:  LOVAZA Take 2 g by mouth 2 (two) times daily.   omeprazole 20 MG capsule Commonly known as:  PRILOSEC Take 1 capsule (20 mg total) by mouth daily.   polyethylene glycol packet Commonly known as:  MIRALAX / GLYCOLAX Take 17 g by mouth as needed for mild constipation.   polyvinyl alcohol 1.4 % ophthalmic solution Commonly known as:  LIQUIFILM TEARS Place 1 drop into both eyes 3 (three) times daily as needed for dry eyes.   simvastatin 40 MG tablet Commonly known as:  ZOCOR Take 40 mg by mouth daily.   tamsulosin 0.4 MG Caps capsule Commonly known as:  FLOMAX Take 1 capsule (0.4 mg total) by mouth at bedtime.          Objective:   Physical Exam BP (!) 142/62 (BP Location: Right Arm, Patient Position: Sitting, Cuff Size: Normal)   Pulse 85   Resp 16   Ht 6\' 6"  (1.981 m)   Wt 228 lb (103.4 kg)   SpO2 97%   BMI 26.35 kg/m  General:   Well developed, well nourished . NAD.  HEENT:  Normocephalic . Face symmetric, atraumatic Neck: No thyromegaly Lungs:  CTA B Normal respiratory effort, no intercostal retractions, no accessory muscle use. Heart: RRR,  no murmur.  No pretibial edema bilaterally  Skin: Not pale. Not jaundice MSK: HANDs w/ bony changes consistent with DJD, no deformities, no  synovitis.  + Muscle atrophy bilaterally and symmetrically Neurologic:  alert & oriented to self, space and time.Marland Kitchen  Speech normal, gait assisted by a cane Psych--  Cooperative .No anxious or depressed appearing.  Assessment & Plan:   Assessment -- seen regularly at [name], most chronic medical  issues f/u there DM with neuropathy  -- [facility] Anxiety Gait disorder, multifactorial  CV: --CAD --A. 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 Rx not driving, family to provide medications. Intolerant to Aricept   H/o eye muscle paralysis congenital  PLAN:  Fall, head contusion, right hand injury: Overall doing better with no red flag symptoms, see ROS.  Used a thumb spica splint for 2-3 days, then stopped; is pain-free.  Will call if pain return for ortho referral Dementia: Worse per wife, tolerating Namenda okay. DM: Well-controlled with metformin and Lantus, check A1c, CBG goals discussed, watch diet, he tends to eat   -according to the wife-   a lot of candy and cookies. Hyperlipidemia: Continue Lovaza, Zocor, check a FLP Weight loss?  Weight has been stable. RTC 4 months

## 2017-06-10 NOTE — Patient Instructions (Signed)
GO TO THE LAB : Get the blood work     GO TO THE FRONT DESK Schedule your next appointment for a   checkup in 4 months  Diabetes: Check your blood sugar  once a day (or more)   at different times of the day  GOALS: Fasting before a meal 90- 130 2 hours after a meal less than 180  Call if consistently not at goal  Check the  blood pressure 2 or 3 times a week  Be sure your blood pressure is between 110/65 and  135/85. If it is consistently higher or lower, let me know

## 2017-06-11 NOTE — Assessment & Plan Note (Signed)
Fall, head contusion, right hand injury: Overall doing better with no red flag symptoms, see ROS.  Used a thumb spica splint for 2-3 days, then stopped; is pain-free.  Will call if pain return for ortho referral Dementia: Worse per wife, tolerating Namenda okay. DM: Well-controlled with metformin and Lantus, check A1c, CBG goals discussed, watch diet, he tends to eat   -according to the wife-   a lot of candy and cookies. Hyperlipidemia: Continue Lovaza, Zocor, check a FLP Weight loss?  Weight has been stable. RTC 4 months

## 2017-08-07 ENCOUNTER — Encounter: Payer: Self-pay | Admitting: Internal Medicine

## 2017-08-07 ENCOUNTER — Ambulatory Visit (INDEPENDENT_AMBULATORY_CARE_PROVIDER_SITE_OTHER): Payer: Medicare Other | Admitting: Internal Medicine

## 2017-08-07 VITALS — BP 126/78 | HR 65 | Temp 97.5°F | Resp 16 | Ht 78.0 in | Wt 226.2 lb

## 2017-08-07 DIAGNOSIS — F0150 Vascular dementia without behavioral disturbance: Secondary | ICD-10-CM | POA: Diagnosis not present

## 2017-08-07 DIAGNOSIS — M1991 Primary osteoarthritis, unspecified site: Secondary | ICD-10-CM

## 2017-08-07 DIAGNOSIS — R269 Unspecified abnormalities of gait and mobility: Secondary | ICD-10-CM

## 2017-08-07 NOTE — Progress Notes (Signed)
Pre visit review using our clinic review tool, if applicable. No additional management support is needed unless otherwise documented below in the visit note. 

## 2017-08-07 NOTE — Progress Notes (Signed)
Subjective:    Patient ID: Ivan Anderson, male    DOB: [birth year], 77 y.o.   MRN: [id number]  DOS:  08/07/2017 Type of visit - description :  Needs help w/ paper work Interval history: In general the patient is a stable but the wife noted that he is having gradually more problems with ADLs. The patient is able to feed himself but has problems with bowel and bladder incontinence, uses depends all the time. Gait is assisted by either a walker or a cane. He is able to feed himself by mouth. He is unable to handle his finances or paperwork, the wife is doing that for him. He does not venture out of the house without his wife.  They usually limit day outings to going to church, doctor's appointments and eating. Vision is good. Has dementia, that is stable over time   Review of Systems   Past Medical History:  Diagnosis Date  . Anxiety   . Atrial fibrillation, new onset ([place]) 03/27/2015  . CAD (coronary artery disease)   . Colon polyp    Cscope at [facility] aprox 2006, repeated 2010 (-), next 5 years  . Dementia    per family diagnosed Aug-2016  . Diabetes mellitus with neuropathy ([place])   . Diaphragm paralysis    R, per pt   . DJD (degenerative joint disease)   . Erectile dysfunction   . Eye muscle paralysis    congenital, lateral rectus, left  . Heart palpitations   . Hemorrhoids   . Hyperlipidemia   . Microscopic hematuria 2017   seeing urology  . Mild cognitive disorder    Unspecified mild NCD  . Skin cancer    sees derm    Past Surgical History:  Procedure Laterality Date  . ANKLE FUSION Right   . APPENDECTOMY    . CARPAL TUNNEL RELEASE     B, 2000  . CYSTOSCOPY  2017  . HIP SURGERY Right 1964   pins  . HIP SURGERY  2009   RIGHT--s/p hip replacement, s/p revision August 2009 due to dislocation x 3   . HIP SURGERY  04-2012, 2017   R hip revision  . KNEE SURGERY Left 2005  . MEDIAL PARTIAL KNEE REPLACEMENT Right 08-2012  . TOTAL HIP ARTHROPLASTY  08-09-15   [facility]    Social History   Socioeconomic History  . Marital status: Married    Spouse name: Not on file  . Number of children: Not on file  . Years of education: Not on file  . Highest education level: Not on file  Occupational History  . Occupation: Cabin crew  Social Needs  . Financial resource strain: Not on file  . Food insecurity:    Worry: Not on file    Inability: Not on file  . Transportation needs:    Medical: Not on file    Non-medical: Not on file  Tobacco Use  . Smoking status: Former Smoker    Last attempt to quit: 01/02/1981    Years since quitting: 36.6  . Smokeless tobacco: Never Used  . Tobacco comment: quit 1980s  Substance and Sexual Activity  . Alcohol use: No    Alcohol/week: 0.0 oz  . Drug use: No  . Sexual activity: Not on file  Lifestyle  . Physical activity:    Days per week: Not on file    Minutes per session: Not on file  . Stress: Not on file  Relationships  . Social connections:  Talks on phone: Not on file    Gets together: Not on file    Attends religious service: Not on file    Active member of club or organization: Not on file    Attends meetings of clubs or organizations: Not on file    Relationship status: Not on file  . Intimate partner violence:    Fear of current or ex partner: Not on file    Emotionally abused: Not on file    Physically abused: Not on file    Forced sexual activity: Not on file  Other Topics Concern  . Not on file  Social History Narrative  . Not on file      Allergies as of 08/07/2017      Reactions   Pseudoephedrine Other (See Comments)   Dx: A. Fib.       Medication List        Accurate as of 08/07/17 11:59 PM. Always use your most recent med list.          acetaminophen 500 MG tablet Commonly known as:  TYLENOL Take 1,000 mg by mouth every 6 (six) hours as needed for moderate pain.   apixaban 5 MG Tabs tablet Commonly known as:  ELIQUIS Take 1 tablet (5 mg total) by mouth 2 (two) times  daily.   bacitracin ointment Apply 1 application topically 2 (two) times daily.   buPROPion 75 MG tablet Commonly known as:  WELLBUTRIN Take 1 tablet (75 mg total) by mouth 2 (two) times daily.   Cinnamon 500 MG capsule Take 1,000 mg by mouth 2 (two) times daily.   insulin glargine 100 UNIT/ML injection Commonly known as:  LANTUS Inject 5 Units into the skin at bedtime.   loratadine 10 MG tablet Commonly known as:  CLARITIN Take 10 mg by mouth every morning.   meclizine 12.5 MG tablet Commonly known as:  ANTIVERT Take 1 tablet (12.5 mg total) by mouth 2 (two) times daily as needed for dizziness.   memantine 10 MG tablet Commonly known as:  NAMENDA Take 1 tablet (10 mg total) 2 (two) times daily by mouth.   metFORMIN 1000 MG tablet Commonly known as:  GLUCOPHAGE Take 1 tablet (1,000 mg total) by mouth 2 (two) times daily with a meal.   multivitamin tablet Take 1 tablet by mouth every morning.   omega-3 acid ethyl esters 1 g capsule Commonly known as:  LOVAZA Take 2 g by mouth 2 (two) times daily.   omeprazole 20 MG capsule Commonly known as:  PRILOSEC Take 1 capsule (20 mg total) by mouth daily.   polyethylene glycol packet Commonly known as:  MIRALAX / GLYCOLAX Take 17 g by mouth as needed for mild constipation.   polyvinyl alcohol 1.4 % ophthalmic solution Commonly known as:  LIQUIFILM TEARS Place 1 drop into both eyes 3 (three) times daily as needed for dry eyes.   simvastatin 40 MG tablet Commonly known as:  ZOCOR Take 40 mg by mouth daily.   tamsulosin 0.4 MG Caps capsule Commonly known as:  FLOMAX Take 1 capsule (0.4 mg total) by mouth at bedtime.          Objective:   Physical Exam BP 126/78 (BP Location: Right Arm, Patient Position: Sitting, Cuff Size: Small)   Pulse 65   Temp (!) 97.5 F (36.4 C) (Oral)   Resp 16   Ht 6\' 6"  (1.981 m)   Wt 226 lb 4 oz (102.6 kg)   SpO2 97%   BMI  26.15 kg/m   General:   Well developed, well nourished .  NAD.  HEENT:  Normocephalic . Face symmetric, atraumatic MSK: Atrophy upper extremities mostly distal.  Had a hard time buttoning his T-shirt Skin: Not pale. Not jaundice Neurologic:  alert & oriented X3.  Speech normal, gait is limited, assisted by a cane Psych--  Behavior appropriate. No anxious or depressed appearing.      Assessment & Plan:    Assessment -- seen regularly at [name], most chronic medical  issues f/u there DM with neuropathy  -- [facility] Anxiety Gait disorder, multifactorial  CV: --CAD --A. 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 Rx not driving, family to provide medications. Intolerant to Aricept   H/o eye muscle paralysis congenital  PLAN:  Gait disorder, anxiety, DJD, vascular dementia: Paperwork completed according to the patient's symptoms.  Today, I spent more than the   min with the patient and his wife, >50% of the time counseling and coordinating his care regards gait disorder, anxiety, dementia

## 2017-08-09 NOTE — Assessment & Plan Note (Signed)
Gait disorder, anxiety, DJD, vascular dementia: Paperwork completed according to the patient's symptoms.
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
## 2017-08-12 ENCOUNTER — Encounter: Payer: Self-pay | Admitting: Neurology

## 2017-08-12 ENCOUNTER — Ambulatory Visit (INDEPENDENT_AMBULATORY_CARE_PROVIDER_SITE_OTHER): Payer: Medicare Other | Admitting: Neurology

## 2017-08-12 ENCOUNTER — Encounter: Payer: Self-pay | Admitting: Psychology

## 2017-08-12 VITALS — BP 128/84 | HR 70 | Ht 78.0 in | Wt 227.0 lb

## 2017-08-12 DIAGNOSIS — F0150 Vascular dementia without behavioral disturbance: Secondary | ICD-10-CM

## 2017-08-12 NOTE — Progress Notes (Signed)
I met with the patient and his wife while they were in the clinic today.  We talked a little bit around strategy for planning a routine/schedule.  I provided them with a copy of a paper tool that can help them to start mapping out a ideal routine.  We also talked about exercise and incorporating that into the routine.  They are currently members at the [employer] program through the [place] [facility] at the hospital.  It is unclear of the resources that the patient can get through [name] in terms of respite/caregiver assistance.  I will contact the social worker Glean Hess at the [facility].her number is [phone] x [phone] and e-mail is [email or web address].   The wife previously had attended the caregiver support group through [facility] that was located at [place].  It is her plan to start attending again and she gets support from her daughter with care at this time.  I will research resources for the patient and provide them to after I can determine what resources are available through [name].

## 2017-08-13 ENCOUNTER — Telehealth: Payer: Self-pay | Admitting: Psychology

## 2017-08-13 NOTE — Telephone Encounter (Signed)
Telephone call to the patient's wife to provide her some resources on [place] caregiver support.  This information has not been provided to her through her [place] Education officer, museum.  I contacted the number myself and spoke with someone to ensure that the caregiver with the able to speak to a person today and that it would not be an automated experience.  The plan is for her to call this program today.  If they do not connect her to programs that are going to be helpful to her she is to call me back and let me know.  At the minimum she should be given a very clear understanding of what [name] can provide her in terms of respite care, long-term care needs and support.  Information on the program:   [phone]  Assistance is just a quick phone call away - while you're supporting a Veteran, we're here to support you. Sometimes, the best thing to do is just talk it out. We have the support and information you need.  Our sole purpose is to help you - the wife or husband, mother or father, sister or brother, daughter or son, or loving family member or friend - who cares for a [employer]. Know that you deserve support, too, and you are eligible for assistance. VA's Caregiver Support Line has licensed caring professionals standing by. We can:  .Tell you about the assistance available from [place]. Marland KitchenHelp you access services and benefits. .Connect you with your local family Caregiver Support Coordinator at a [place] medical center near you. .Just listen, if that's what you need right now.  Your Physiological scientist is a Therapist, sports who can support you by matching you with services for which you are eligible, and providing you with valuable information about resources that can help you stay smart, strong and organized as you care for the [facility] you  love. Call [place] at [phone] to learn more about the support that is available to you and for assistance connecting with the Caregiver Support Coordinator at your local [facility].

## 2017-08-15 ENCOUNTER — Telehealth: Payer: Self-pay | Admitting: Psychology

## 2017-08-15 NOTE — Telephone Encounter (Signed)
Telephone call with the [facility] social worker Maggie.  I have inquired about her ribs that the patient qualifies for and was told there is a respite program that can provide care for up to 6 hours a day and this option is available for over 30 days a year.  In addition I was told about a home health aide program that can help with light housekeeping.  In that case the patient would need to help with more than 2 ADLs.  The plan is that Ivan Anderson is going to contact the family to give the caregiver for this information and signed them up as appropriate.  Since the family has not had a good experience with accessing resources from [place], I will contact the patient's caregiver in a day or so to make sure that this phone call occurred.

## 2017-08-15 NOTE — Telephone Encounter (Signed)
Telephone call to patient's wife asking if she had received a call from [name] social worker.  Waiting for telephone call back.

## 2017-10-11 ENCOUNTER — Ambulatory Visit (INDEPENDENT_AMBULATORY_CARE_PROVIDER_SITE_OTHER): Payer: Medicare Other | Admitting: Internal Medicine

## 2017-10-11 ENCOUNTER — Encounter: Payer: Self-pay | Admitting: Internal Medicine

## 2017-10-11 VITALS — BP 116/74 | HR 102 | Temp 97.4°F | Resp 16 | Ht 78.0 in | Wt 227.1 lb

## 2017-10-11 DIAGNOSIS — E785 Hyperlipidemia, unspecified: Secondary | ICD-10-CM

## 2017-10-11 DIAGNOSIS — F0150 Vascular dementia without behavioral disturbance: Secondary | ICD-10-CM

## 2017-10-11 DIAGNOSIS — R269 Unspecified abnormalities of gait and mobility: Secondary | ICD-10-CM | POA: Diagnosis not present

## 2017-10-11 DIAGNOSIS — Z794 Long term (current) use of insulin: Secondary | ICD-10-CM | POA: Diagnosis not present

## 2017-10-11 DIAGNOSIS — E1159 Type 2 diabetes mellitus with other circulatory complications: Secondary | ICD-10-CM

## 2017-10-11 LAB — BASIC METABOLIC PANEL
BUN: 19 mg/dL (ref 6–23)
CO2: 31 mEq/L (ref 19–32)
Calcium: 9.2 mg/dL (ref 8.4–10.5)
Chloride: 100 mEq/L (ref 96–112)
Creatinine, Ser: 0.74 mg/dL (ref 0.40–1.50)
GFR: 108.35 mL/min (ref >60.00)
Glucose, Bld: 194 mg/dL — ABNORMAL HIGH (ref 70–99)
POTASSIUM: 4.9 meq/L (ref 3.5–5.1)
SODIUM: 140 meq/L (ref 135–145)

## 2017-10-11 LAB — HEMOGLOBIN A1C: HEMOGLOBIN A1C: 6.8 % — AB (ref 4.6–6.5)

## 2017-10-11 NOTE — Progress Notes (Signed)
Pre visit review using our clinic review tool, if applicable. No additional management support is needed unless otherwise documented below in the visit note. 

## 2017-10-11 NOTE — Progress Notes (Signed)
Subjective:    Patient ID: Ivan Anderson, male    DOB: [birth year], 77 y.o.   MRN: [id number]  DOS:  10/11/2017 Type of visit - description : f/u, here with his wife Interval history: Overall, wife reports that he seems to be slower, weaker.  Fortunately he is using his cane and had no recent falls. Mood seems to be a issue, he from time to time and cries when he listen to certain music.  He feels somewhat sad on and off. Wife reports that he is not interested on anything, not interested on watching TV or talking to people etc.  The only thing he asks is for her to drive him around at least one time a day and go to a restaurant.  Review of Systems  Patient denies any suicidal ideas.  Past Medical History:  Diagnosis Date  . Anxiety   . Atrial fibrillation, new onset ([place]) 03/27/2015  . CAD (coronary artery disease)   . Colon polyp    Cscope at [facility] aprox 2006, repeated 2010 (-), next 5 years  . Dementia    per family diagnosed Aug-2016  . Diabetes mellitus with neuropathy ([place])   . Diaphragm paralysis    R, per pt   . DJD (degenerative joint disease)   . Erectile dysfunction   . Eye muscle paralysis    congenital, lateral rectus, left  . Heart palpitations   . Hemorrhoids   . Hyperlipidemia   . Microscopic hematuria 2017   seeing urology  . Mild cognitive disorder    Unspecified mild NCD  . Skin cancer    sees derm    Past Surgical History:  Procedure Laterality Date  . ANKLE FUSION Right   . APPENDECTOMY    . CARPAL TUNNEL RELEASE     B, 2000  . CYSTOSCOPY  2017  . HIP SURGERY Right 1964   pins  . HIP SURGERY  2009   RIGHT--s/p hip replacement, s/p revision August 2009 due to dislocation x 3   . HIP SURGERY  04-2012, 2017   R hip revision  . KNEE SURGERY Left 2005  . MEDIAL PARTIAL KNEE REPLACEMENT Right 08-2012  . TOTAL HIP ARTHROPLASTY  08-09-15   [facility]    Social History   Socioeconomic History  . Marital status: Married    Spouse name: Not on  file  . Number of children: Not on file  . Years of education: Not on file  . Highest education level: Not on file  Occupational History  . Occupation: Cabin crew  Social Needs  . Financial resource strain: Not on file  . Food insecurity:    Worry: Not on file    Inability: Not on file  . Transportation needs:    Medical: Not on file    Non-medical: Not on file  Tobacco Use  . Smoking status: Former Smoker    Last attempt to quit: 01/02/1981    Years since quitting: 36.8  . Smokeless tobacco: Never Used  . Tobacco comment: quit 1980s  Substance and Sexual Activity  . Alcohol use: No    Alcohol/week: 0.0 oz  . Drug use: No  . Sexual activity: Not on file  Lifestyle  . Physical activity:    Days per week: Not on file    Minutes per session: Not on file  . Stress: Not on file  Relationships  . Social connections:    Talks on phone: Not on file    Gets together: Not  on file    Attends religious service: Not on file    Active member of club or organization: Not on file    Attends meetings of clubs or organizations: Not on file    Relationship status: Not on file  . Intimate partner violence:    Fear of current or ex partner: Not on file    Emotionally abused: Not on file    Physically abused: Not on file    Forced sexual activity: Not on file  Other Topics Concern  . Not on file  Social History Narrative  . Not on file      Allergies as of 10/11/2017      Reactions   Pseudoephedrine Other (See Comments)   Dx: A. Fib.       Medication List        Accurate as of 10/11/17 11:59 PM. Always use your most recent med list.          acetaminophen 500 MG tablet Commonly known as:  TYLENOL Take 1,000 mg by mouth every 6 (six) hours as needed for moderate pain.   apixaban 5 MG Tabs tablet Commonly known as:  ELIQUIS Take 1 tablet (5 mg total) by mouth 2 (two) times daily.   buPROPion 75 MG tablet Commonly known as:  WELLBUTRIN Take 1 tablet (75 mg total) by mouth 2  (two) times daily.   Cinnamon 500 MG capsule Take 1,000 mg by mouth 2 (two) times daily.   insulin glargine 100 UNIT/ML injection Commonly known as:  LANTUS Inject 5 Units into the skin at bedtime.   loratadine 10 MG tablet Commonly known as:  CLARITIN Take 10 mg by mouth every morning.   meclizine 12.5 MG tablet Commonly known as:  ANTIVERT Take 1 tablet (12.5 mg total) by mouth 2 (two) times daily as needed for dizziness.   memantine 10 MG tablet Commonly known as:  NAMENDA Take 1 tablet (10 mg total) 2 (two) times daily by mouth.   metFORMIN 1000 MG tablet Commonly known as:  GLUCOPHAGE Take 1 tablet (1,000 mg total) by mouth 2 (two) times daily with a meal.   multivitamin tablet Take 1 tablet by mouth every morning.   omega-3 acid ethyl esters 1 g capsule Commonly known as:  LOVAZA Take 2 g by mouth 2 (two) times daily.   omeprazole 20 MG capsule Commonly known as:  PRILOSEC Take 1 capsule (20 mg total) by mouth daily.   polyethylene glycol packet Commonly known as:  MIRALAX / GLYCOLAX Take 17 g by mouth as needed for mild constipation.   polyvinyl alcohol 1.4 % ophthalmic solution Commonly known as:  LIQUIFILM TEARS Place 1 drop into both eyes 3 (three) times daily as needed for dry eyes.   simvastatin 40 MG tablet Commonly known as:  ZOCOR Take 40 mg by mouth daily.   tamsulosin 0.4 MG Caps capsule Commonly known as:  FLOMAX Take 1 capsule (0.4 mg total) by mouth at bedtime.          Objective:   Physical Exam BP 116/74 (BP Location: Left Arm, Patient Position: Sitting, Cuff Size: Small)   Pulse (!) 102   Temp (!) 97.4 F (36.3 C) (Oral)   Resp 16   Ht 6\' 6"  (1.981 m)   Wt 227 lb 2 oz (103 kg)   SpO2 95%   BMI 26.25 kg/m  General:   Well developed, well nourished . NAD.  HEENT:  Normocephalic . Face symmetric, atraumatic Lungs:  CTA  B Normal respiratory effort, no intercostal retractions, no accessory muscle use. Heart: RRR,  no murmur.   No pretibial edema bilaterally  Skin: Not pale. Not jaundice Neurologic:  alert & orientedx3 Speech normal, gait assisted by a cane Psych--  Cooperative.  Slightly apprehensive but no depressed appearing    Assessment & Plan:   Assessment -- seen regularly at [name], most chronic medical  issues f/u there DM with neuropathy  [facility] Hyper[facility] Anxiety Gait disorder, multifactorial  CV: --CAD --A. 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  Rx not driving, family to provide medications.Intolerant to Aricept   H/o eye muscle paralysis congenital  PLAN:  DM: Continue insulin, metformin, check a BMP and A1c High cholesterol: Last LDL 100 night on March 2018, no change for now Vascular dementia: Saw neurology 08/12/2017, was referred to the social worker.  Continue Namenda, encourage to remain active mentally and engage socially.  He actually have some support from [name] including coloring books but the patient so far has not picked them up.  Encouraged to do. PBA: Has occasional episode of crying, hard to differentiate PBA versus depression.  Recently, [facility]a was d/w neuro, holding it for now. Gait disorder: Saw neurology, they felt this was multifactorial including MSK issues, neuropathy.  Balance therapy?  We agreed today to refer him to PT at [place] 4 months  Today, I spent more than 30   min with the patient: >50% of the time counseling regards vascular dementia, encouraging to stay active, trying to distinguish between PBA vs depression, coordinating his care and reviewing the chart

## 2017-10-11 NOTE — Patient Instructions (Addendum)
GO TO THE LAB : Get the blood work     GO TO THE FRONT DESK Schedule your next appointment for a   checkup in 4 months  We will refer you to physical therapy  Continue same medications  Try to stay active mentally: Puzzles, coloring books, watch the news, engaged with friends and family.

## 2017-10-12 NOTE — Assessment & Plan Note (Signed)
DM: Continue insulin, metformin, check a BMP and A1c High cholesterol: Last LDL 100 night on March 2018, no change for now Vascular dementia: Saw neurology 08/12/2017, was referred to the social worker.  Continue Namenda, encourage to remain active mentally and engage socially.  He actually have some support from [name] including coloring books but the patient so far has not picked them up.  Encouraged to do. PBA: Has occasional episode of crying, hard to differentiate PBA versus depression.  Recently, Nuedexta was d/w neuro, holding it for now. Gait disorder: Saw neurology, they felt this was multifactorial including MSK issues, neuropathy.  Balance therapy?  We agreed today to refer him to PT at [place] 4 months

## 2017-11-07 ENCOUNTER — Encounter (HOSPITAL_BASED_OUTPATIENT_CLINIC_OR_DEPARTMENT_OTHER): Payer: Self-pay | Admitting: *Deleted

## 2017-11-07 ENCOUNTER — Emergency Department (HOSPITAL_BASED_OUTPATIENT_CLINIC_OR_DEPARTMENT_OTHER): Payer: Medicare Other

## 2017-11-07 ENCOUNTER — Other Ambulatory Visit: Payer: Self-pay

## 2017-11-07 ENCOUNTER — Emergency Department (HOSPITAL_COMMUNITY): Payer: Medicare Other

## 2017-11-07 ENCOUNTER — Emergency Department (HOSPITAL_BASED_OUTPATIENT_CLINIC_OR_DEPARTMENT_OTHER)
Admission: EM | Admit: 2017-11-07 | Discharge: 2017-11-07 | Disposition: A | Discharge status: Home / Self Care | Payer: Medicare Other | Attending: Emergency Medicine | Admitting: Emergency Medicine

## 2017-11-07 DIAGNOSIS — Z79899 Other long term (current) drug therapy: Secondary | ICD-10-CM | POA: Diagnosis not present

## 2017-11-07 DIAGNOSIS — R531 Weakness: Secondary | ICD-10-CM | POA: Diagnosis not present

## 2017-11-07 DIAGNOSIS — Z96651 Presence of right artificial knee joint: Secondary | ICD-10-CM | POA: Insufficient documentation

## 2017-11-07 DIAGNOSIS — Z87891 Personal history of nicotine dependence: Secondary | ICD-10-CM | POA: Insufficient documentation

## 2017-11-07 DIAGNOSIS — R29898 Other symptoms and signs involving the musculoskeletal system: Secondary | ICD-10-CM

## 2017-11-07 DIAGNOSIS — F0390 Unspecified dementia without behavioral disturbance: Secondary | ICD-10-CM | POA: Insufficient documentation

## 2017-11-07 DIAGNOSIS — R42 Dizziness and giddiness: Secondary | ICD-10-CM | POA: Diagnosis not present

## 2017-11-07 DIAGNOSIS — Z7901 Long term (current) use of anticoagulants: Secondary | ICD-10-CM | POA: Diagnosis not present

## 2017-11-07 DIAGNOSIS — I110 Hypertensive heart disease with heart failure: Secondary | ICD-10-CM | POA: Diagnosis not present

## 2017-11-07 DIAGNOSIS — I5032 Chronic diastolic (congestive) heart failure: Secondary | ICD-10-CM | POA: Insufficient documentation

## 2017-11-07 DIAGNOSIS — F329 Major depressive disorder, single episode, unspecified: Secondary | ICD-10-CM | POA: Insufficient documentation

## 2017-11-07 DIAGNOSIS — Z85828 Personal history of other malignant neoplasm of skin: Secondary | ICD-10-CM | POA: Diagnosis not present

## 2017-11-07 DIAGNOSIS — F419 Anxiety disorder, unspecified: Secondary | ICD-10-CM | POA: Diagnosis not present

## 2017-11-07 DIAGNOSIS — I2510 Atherosclerotic heart disease of native coronary artery without angina pectoris: Secondary | ICD-10-CM | POA: Insufficient documentation

## 2017-11-07 DIAGNOSIS — R5383 Other fatigue: Secondary | ICD-10-CM | POA: Diagnosis not present

## 2017-11-07 DIAGNOSIS — Z96649 Presence of unspecified artificial hip joint: Secondary | ICD-10-CM | POA: Insufficient documentation

## 2017-11-07 LAB — CBG MONITORING, ED: Glucose-Capillary: 139 mg/dL — ABNORMAL HIGH (ref 70–99)

## 2017-11-07 LAB — COMPREHENSIVE METABOLIC PANEL
ALK PHOS: 114 U/L (ref 38–126)
ALT: 19 U/L (ref 0–44)
AST: 29 U/L (ref 15–41)
Albumin: 3.7 g/dL (ref 3.5–5.0)
Anion gap: 12 (ref 5–15)
BUN: 25 mg/dL — AB (ref 8–23)
CALCIUM: 8.9 mg/dL (ref 8.9–10.3)
CHLORIDE: 101 mmol/L (ref 98–111)
CO2: 24 mmol/L (ref 22–32)
Creatinine, Ser: 0.96 mg/dL (ref 0.61–1.24)
GFR calc Af Amer: >60 mL/min (ref >60)
Glucose, Bld: 188 mg/dL — ABNORMAL HIGH (ref 70–99)
Potassium: 4.5 mmol/L (ref 3.5–5.1)
SODIUM: 137 mmol/L (ref 135–145)
Total Bilirubin: 0.5 mg/dL (ref 0.3–1.2)
Total Protein: 6.2 g/dL — ABNORMAL LOW (ref 6.5–8.1)

## 2017-11-07 LAB — CBC WITH DIFFERENTIAL/PLATELET
BASOS ABS: 0.1 10*3/uL (ref 0.0–0.1)
Basophils Relative: 1 %
EOS PCT: 13 %
Eosinophils Absolute: 1 10*3/uL — ABNORMAL HIGH (ref 0.0–0.7)
HCT: 41.7 % (ref 39.0–52.0)
HEMOGLOBIN: 14.3 g/dL (ref 13.0–17.0)
LYMPHS ABS: 1.3 10*3/uL (ref 0.7–4.0)
LYMPHS PCT: 16 %
MCH: 31.6 pg (ref 26.0–34.0)
MCHC: 34.3 g/dL (ref 30.0–36.0)
MCV: 92.1 fL (ref 78.0–100.0)
Monocytes Absolute: 0.6 10*3/uL (ref 0.1–1.0)
Monocytes Relative: 7 %
NEUTROS PCT: 63 %
Neutro Abs: 5.4 10*3/uL (ref 1.7–7.7)
PLATELETS: 206 10*3/uL (ref 150–400)
RBC: 4.53 MIL/uL (ref 4.22–5.81)
RDW: 13.3 % (ref 11.5–15.5)
WBC: 8.3 10*3/uL (ref 4.0–10.5)

## 2017-11-07 LAB — TROPONIN I: Troponin I: <0.03 ng/mL (ref <0.03)

## 2017-11-07 MED ORDER — SODIUM CHLORIDE 0.9 % IV BOLUS
1000.0000 mL | Freq: Once | INTRAVENOUS | Status: AC
Start: 2017-11-07 — End: 2017-11-07
  Administered 2017-11-07: 1000 mL via INTRAVENOUS

## 2017-11-07 NOTE — ED Notes (Signed)
Report given to [facility] at [facility] ED.

## 2017-11-07 NOTE — ED Provider Notes (Signed)
Pt seen at [facility] [place].  Please see PA Kirchenko's note.  Pt sent to the ED for MRI to evaluate for stroke.  MRI result negative for acute stroke.  Discussed findings with patient.  Stable for outpatient follow up.  Pt will follow up with the [facility].  Suggested neurology eval.  Brain MRI IMPRESSION:  1. No acute intracranial process.  2. Moderate global parenchymal brain volume loss.  3. Moderate to severe chronic small vessel ischemic changes and old  lacunar infarcts.      Electronically Signed  By: Elon Alas M.D.      Dorie Rank, MD 11/07/17 [phone]

## 2017-11-07 NOTE — ED Triage Notes (Signed)
Pt c/o generalized weakness x 3 weeks, also c/o dizziness.

## 2017-11-07 NOTE — Discharge Instructions (Addendum)
Follow up with your doctor at the [facility] for further evaluation as we discussed, consider seeing a neurologist

## 2017-11-07 NOTE — ED Notes (Signed)
Called [facility] spoke to Addy for transport to [facility] ED

## 2017-11-07 NOTE — ED Notes (Signed)
Signature pad unavailable at time of pt discharge. Pt verbalized understanding of instrucitons.

## 2017-11-07 NOTE — ED Notes (Signed)
ED Provider at bedside. 

## 2017-11-07 NOTE — ED Notes (Signed)
Report given to [facility] with [facility].

## 2017-11-07 NOTE — ED Notes (Signed)
Patient transported to MRI 

## 2017-11-07 NOTE — ED Provider Notes (Signed)
[facility] Provider Note   CSN: [id number] Arrival date & time: 11/07/17  1330     History   Chief Complaint Chief Complaint  Patient presents with  . Weakness    HPI Ivan Anderson is a 77 y.o. male.  HPI Ivan Anderson is a 77 y.o. male with history of coronary artery disease, atrial fibrillation on Eliquis, diabetes, hypertension, dementia, presents to emergency department complaining of weakness.  Wife providing most of the history.  She states that she has noticed that patient has been progressively more weak over several months.  She states that he has been dragging his right leg for that long as well.  She also noticed in the last few weeks that he has gotten weak in his right arm.  She states sometimes he has to use his left arm to eat.  In the last several days he has gotten even weaker to where now he is not eating as much or drinking enough fluids.  He is not walking as well.  He has not had any fever or chills.  He is not complaining of any pain anywhere.  He has no other complaints.  Past Medical History:  Diagnosis Date  . Anxiety   . Atrial fibrillation, new onset ([place]) 03/27/2015  . CAD (coronary artery disease)   . Colon polyp    Cscope at [facility] aprox 2006, repeated 2010 (-), next 5 years  . Dementia    per family diagnosed Aug-2016  . Diabetes mellitus with neuropathy ([place])   . Diaphragm paralysis    R, per pt   . DJD (degenerative joint disease)   . Erectile dysfunction   . Eye muscle paralysis    congenital, lateral rectus, left  . Heart palpitations   . Hemorrhoids   . Hyperlipidemia   . Microscopic hematuria 2017   seeing urology  . Mild cognitive disorder    Unspecified mild NCD  . Skin cancer    sees derm    Patient Active Problem List   Diagnosis Date Noted  . Gait disorder 02/14/2017  . Lower urinary tract symptoms (LUTS) 02/14/2017  . HLD (hyperlipidemia) 07/17/2016  . GERD (gastroesophageal reflux disease)  07/17/2016  . Depression 07/17/2016  . Chronic diastolic CHF (congestive heart failure) ([place]) 07/17/2016  . Bradycardia 06/15/2016  . PCP NOTES >>>>>> 03/29/2015  . Paroxysmal atrial fibrillation ([place]) 03/27/2015  . 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 TRIGGER FINGER, LEFT MIDDLE 06/08/2010  . HAND PAIN, LEFT 06/08/2010  . ANXIETY 12/19/2006  . 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 Dyslipidemia 10/01/2006  . Diabetes ([place]) 05/04/2006  . ERECTILE DYSFUNCTION 05/04/2006  . Essential hypertension 05/04/2006  . Coronary atherosclerosis 05/04/2006  . Osteoarthritis 05/04/2006  . COLONIC POLYPS, HX OF 05/04/2006    Past Surgical History:  Procedure Laterality Date  . ANKLE FUSION Right   . APPENDECTOMY    . CARPAL TUNNEL RELEASE     B, 2000  . CYSTOSCOPY  2017  . HIP SURGERY Right 1964   pins  . HIP SURGERY  2009   RIGHT--s/p hip replacement, s/p revision August 2009 due to dislocation x 3   . HIP SURGERY  04-2012, 2017   R hip revision  . KNEE SURGERY Left 2005  . MEDIAL PARTIAL KNEE REPLACEMENT Right 08-2012  . TOTAL HIP ARTHROPLASTY  08-09-15   [facility]        Home Medications  Prior to Admission medications   Medication Sig Start Date End Date Taking? Authorizing Provider  acetaminophen (TYLENOL) 500 MG tablet Take 1,000 mg by mouth every 6 (six) hours as needed for moderate pain.     [provider]  apixaban (ELIQUIS) 5 MG TABS tablet Take 1 tablet (5 mg total) by mouth 2 (two) times daily. 11/07/16   Colon Branch, MD  buPROPion (WELLBUTRIN) 75 MG tablet Take 1 tablet (75 mg total) by mouth 2 (two) times daily. 05/25/16   Colon Branch, MD  Cinnamon 500 MG capsule Take 1,000 mg by mouth 2 (two) times daily.     [provider]  insulin glargine (LANTUS) 100 UNIT/ML injection Inject 5 Units into the skin at bedtime.    [provider]  loratadine (CLARITIN) 10 MG tablet Take 10 mg  by mouth every morning.  12/02/14   Colon Branch, MD  meclizine (ANTIVERT) 12.5 MG tablet Take 1 tablet (12.5 mg total) by mouth 2 (two) times daily as needed for dizziness. 03/29/17   Colon Branch, MD  memantine (NAMENDA) 10 MG tablet Take 1 tablet (10 mg total) 2 (two) times daily by mouth. 02/12/17   Tat, Eustace Quail, DO  metFORMIN (GLUCOPHAGE) 1000 MG tablet Take 1 tablet (1,000 mg total) by mouth 2 (two) times daily with a meal. 12/02/14   Colon Branch, MD  Multiple Vitamin (MULTIVITAMIN) tablet Take 1 tablet by mouth every morning.  08/27/12   [provider]  omega-3 acid ethyl esters (LOVAZA) 1 g capsule Take 2 g by mouth 2 (two) times daily.     [provider]  omeprazole (PRILOSEC) 20 MG capsule Take 1 capsule (20 mg total) by mouth daily. 12/02/14   Colon Branch, MD  polyethylene glycol [facility] / Floria Raveling) packet Take 17 g by mouth as needed for mild constipation.    [provider]  polyvinyl alcohol (LIQUIFILM TEARS) 1.4 % ophthalmic solution Place 1 drop into both eyes 3 (three) times daily as needed for dry eyes.    [provider]  simvastatin (ZOCOR) 40 MG tablet Take 40 mg by mouth daily.     [provider]  tamsulosin (FLOMAX) 0.4 MG CAPS capsule Take 1 capsule (0.4 mg total) by mouth at bedtime. 01/14/17   Colon Branch, MD    Family History Family History  Problem Relation Age of Onset  . Dementia Father   . Emphysema Father   . Diabetes Maternal Grandmother   . Heart disease Sister   . Stroke Sister   . Heart attack Neg Hx   . Hypertension Neg Hx     Social History Social History   Tobacco Use  . Smoking status: Former Smoker    Last attempt to quit: 01/02/1981    Years since quitting: 36.8  . Smokeless tobacco: Never Used  . Tobacco comment: quit 1980s  Substance Use Topics  . Alcohol use: No    Alcohol/week: 0.0 oz  . Drug use: No     Allergies   Pseudoephedrine   Review of Systems Review of Systems    Constitutional: Positive for activity change, appetite change and fatigue. Negative for chills and fever.  Respiratory: Negative for cough, chest tightness and shortness of breath.   Cardiovascular: Negative for chest pain, palpitations and leg swelling.  Gastrointestinal: Negative for abdominal distention, abdominal pain, diarrhea, nausea and vomiting.  Genitourinary: Negative for dysuria, frequency, hematuria and urgency.  Musculoskeletal: Positive for gait problem.  Negative for arthralgias, myalgias, neck pain and neck stiffness.  Skin: Negative for rash.  Allergic/Immunologic: Negative for immunocompromised state.  Neurological: Positive for weakness. Negative for dizziness, light-headedness, numbness and headaches.  All other systems reviewed and are negative.    Physical Exam Updated Vital Signs BP 110/65 (BP Location: Right Arm)   Pulse 75   Temp 98 F (36.7 C) (Oral)   Resp 17   Ht 6\' 6"  (1.981 m)   Wt 99.8 kg (220 lb)   SpO2 97%   BMI 25.42 kg/m   Physical Exam  Constitutional: He is oriented to person, place, and time. He appears well-developed and well-nourished. No distress.  HENT:  Head: Normocephalic and atraumatic.  Eyes: Pupils are equal, round, and reactive to light. Conjunctivae are normal.  Abnormal left lateral gaze and left eye, states chronic  Neck: Normal range of motion. Neck supple.  Cardiovascular: Normal rate, regular rhythm and normal heart sounds.  Pulmonary/Chest: Effort normal. No respiratory distress. He has no wheezes. He has no rales.  Abdominal: Soft. Bowel sounds are normal. He exhibits no distension. There is no tenderness. There is no rebound.  Musculoskeletal: He exhibits no edema.  Neurological: He is alert and oriented to person, place, and time.  Right arm pronator drift, patient unable to hold the right arm in front of him.  Weakness with dorsiflexion of the right foot.  Otherwise normal neurological exam.  Skin: Skin is warm and  dry.  Nursing note and vitals reviewed.    ED Treatments / Results  Labs (all labs ordered are listed, but only abnormal results are displayed) Labs Reviewed  CBC WITH DIFFERENTIAL/PLATELET - Abnormal; Notable for the following components:      Result Value   Eosinophils Absolute 1.0 (*)    All other components within normal limits  COMPREHENSIVE METABOLIC PANEL  TROPONIN I  URINALYSIS, ROUTINE W REFLEX MICROSCOPIC    EKG EKG Interpretation  Date/Time:  Thursday November 07 2017 14:15:19 EDT Ventricular Rate:  77 PR Interval:    QRS Duration: 108 QT Interval:  379 QTC Calculation: 429 R Axis:   64 Text Interpretation:  Sinus rhythm Prolonged PR interval Minimal ST elevation, inferior leads Confirmed by Quintella Reichert [phone]) on 11/07/2017 2:29:28 PM   Radiology No results found.  Procedures Procedures (including critical care time)  Medications Ordered in ED Medications - No data to display   Initial Impression / Assessment and Plan / ED Course  I have reviewed the triage vital signs and the nursing notes.  Pertinent labs & imaging results that were available during my care of the patient were reviewed by me and considered in my medical decision making (see chart for details).     Patient with generalized weakness, on exam right pronator drift and some weakness with dorsiflexion of the foot.  Dragging on the right foot when walking, however patient has notes in the computer where he has been followed by neurology for gait abnormality which she thought to be multifactorial.  Patient is also complaining of not eating or drinking, generalized weakness.  Will check labs, CT head, urine analysis.  Vital signs are normal, he is in no distress  3:46 PM Patient CT scan is negative.  Labs unremarkable other than slightly elevated glucose of 188 and an elevated BUN at 25.  I ordered him some fluids.  Possibly from decreased p.o. intake.  Still pending urine analysis. I discussed  patient's right-sided weakness with Dr. Cheral Marker with neurology, he recommended obtaining  an MRI.  Will transfer patient to [facility] emergency department for MRI of his brain. I discussed pt with Dr. Tyrone Nine who accepted him for transfer.   Final Clinical Impressions(s) / ED Diagnoses   Final diagnoses:  Weakness  Right arm weakness    ED Discharge Orders    None       Jeannett Senior, PA-C 11/12/17 1549    Hayden Rasmussen, MD 11/12/17 [phone]

## 2017-11-07 NOTE — ED Notes (Signed)
Pt ambulated x1 assist to bathroom  

## 2017-12-05 ENCOUNTER — Emergency Department (HOSPITAL_COMMUNITY): Payer: Medicare Other

## 2017-12-05 ENCOUNTER — Other Ambulatory Visit: Payer: Self-pay

## 2017-12-05 ENCOUNTER — Observation Stay (HOSPITAL_COMMUNITY)
Admission: EM | Admit: 2017-12-05 | Discharge: 2017-12-06 | Disposition: A | Discharge status: Home / Self Care | Payer: Medicare Other | Attending: Internal Medicine | Admitting: Internal Medicine

## 2017-12-05 ENCOUNTER — Encounter (HOSPITAL_COMMUNITY): Payer: Self-pay

## 2017-12-05 DIAGNOSIS — F419 Anxiety disorder, unspecified: Secondary | ICD-10-CM | POA: Insufficient documentation

## 2017-12-05 DIAGNOSIS — Z96651 Presence of right artificial knee joint: Secondary | ICD-10-CM | POA: Insufficient documentation

## 2017-12-05 DIAGNOSIS — I110 Hypertensive heart disease with heart failure: Secondary | ICD-10-CM | POA: Insufficient documentation

## 2017-12-05 DIAGNOSIS — R531 Weakness: Principal | ICD-10-CM

## 2017-12-05 DIAGNOSIS — R740 Nonspecific elevation of levels of transaminase and lactic acid dehydrogenase [LDH]: Secondary | ICD-10-CM | POA: Insufficient documentation

## 2017-12-05 DIAGNOSIS — Z794 Long term (current) use of insulin: Secondary | ICD-10-CM | POA: Insufficient documentation

## 2017-12-05 DIAGNOSIS — F0150 Vascular dementia without behavioral disturbance: Secondary | ICD-10-CM | POA: Diagnosis not present

## 2017-12-05 DIAGNOSIS — E785 Hyperlipidemia, unspecified: Secondary | ICD-10-CM | POA: Diagnosis not present

## 2017-12-05 DIAGNOSIS — Z79899 Other long term (current) drug therapy: Secondary | ICD-10-CM | POA: Diagnosis not present

## 2017-12-05 DIAGNOSIS — K219 Gastro-esophageal reflux disease without esophagitis: Secondary | ICD-10-CM | POA: Insufficient documentation

## 2017-12-05 DIAGNOSIS — I2510 Atherosclerotic heart disease of native coronary artery without angina pectoris: Secondary | ICD-10-CM | POA: Insufficient documentation

## 2017-12-05 DIAGNOSIS — Z85828 Personal history of other malignant neoplasm of skin: Secondary | ICD-10-CM | POA: Insufficient documentation

## 2017-12-05 DIAGNOSIS — E119 Type 2 diabetes mellitus without complications: Secondary | ICD-10-CM

## 2017-12-05 DIAGNOSIS — Z96641 Presence of right artificial hip joint: Secondary | ICD-10-CM | POA: Insufficient documentation

## 2017-12-05 DIAGNOSIS — F329 Major depressive disorder, single episode, unspecified: Secondary | ICD-10-CM | POA: Diagnosis not present

## 2017-12-05 DIAGNOSIS — R269 Unspecified abnormalities of gait and mobility: Secondary | ICD-10-CM

## 2017-12-05 DIAGNOSIS — E1140 Type 2 diabetes mellitus with diabetic neuropathy, unspecified: Secondary | ICD-10-CM | POA: Insufficient documentation

## 2017-12-05 DIAGNOSIS — I951 Orthostatic hypotension: Secondary | ICD-10-CM

## 2017-12-05 DIAGNOSIS — J8410 Pulmonary fibrosis, unspecified: Secondary | ICD-10-CM | POA: Diagnosis not present

## 2017-12-05 DIAGNOSIS — Z7901 Long term (current) use of anticoagulants: Secondary | ICD-10-CM | POA: Diagnosis not present

## 2017-12-05 DIAGNOSIS — I480 Paroxysmal atrial fibrillation: Secondary | ICD-10-CM | POA: Diagnosis present

## 2017-12-05 DIAGNOSIS — Z87891 Personal history of nicotine dependence: Secondary | ICD-10-CM | POA: Diagnosis not present

## 2017-12-05 DIAGNOSIS — I10 Essential (primary) hypertension: Secondary | ICD-10-CM | POA: Diagnosis present

## 2017-12-05 DIAGNOSIS — I5032 Chronic diastolic (congestive) heart failure: Secondary | ICD-10-CM | POA: Diagnosis not present

## 2017-12-05 DIAGNOSIS — E860 Dehydration: Secondary | ICD-10-CM | POA: Diagnosis not present

## 2017-12-05 HISTORY — DX: Polyneuropathy, unspecified: G62.9

## 2017-12-05 HISTORY — DX: Unspecified abnormalities of gait and mobility: R26.9

## 2017-12-05 LAB — URINALYSIS, ROUTINE W REFLEX MICROSCOPIC
BILIRUBIN URINE: NEGATIVE
GLUCOSE, UA: NEGATIVE mg/dL
HGB URINE DIPSTICK: NEGATIVE
Ketones, ur: NEGATIVE mg/dL
Leukocytes, UA: NEGATIVE
Nitrite: NEGATIVE
Protein, ur: NEGATIVE mg/dL
SPECIFIC GRAVITY, URINE: 1.021 (ref 1.005–1.030)
pH: 5 (ref 5.0–8.0)

## 2017-12-05 LAB — BASIC METABOLIC PANEL
Anion gap: 11 (ref 5–15)
BUN: 27 mg/dL — AB (ref 8–23)
CHLORIDE: 100 mmol/L (ref 98–111)
CO2: 26 mmol/L (ref 22–32)
Calcium: 9.1 mg/dL (ref 8.9–10.3)
Creatinine, Ser: 0.9 mg/dL (ref 0.61–1.24)
GFR calc Af Amer: >60 mL/min (ref >60)
GLUCOSE: 245 mg/dL — AB (ref 70–99)
Potassium: 4.6 mmol/L (ref 3.5–5.1)
SODIUM: 137 mmol/L (ref 135–145)

## 2017-12-05 LAB — CBC WITH DIFFERENTIAL/PLATELET
BASOS ABS: 0 10*3/uL (ref 0.0–0.1)
Basophils Relative: 0 %
Eosinophils Absolute: 0.1 10*3/uL (ref 0.0–0.7)
Eosinophils Relative: 2 %
HEMATOCRIT: 40.7 % (ref 39.0–52.0)
Hemoglobin: 14.1 g/dL (ref 13.0–17.0)
LYMPHS PCT: 20 %
Lymphs Abs: 1.5 10*3/uL (ref 0.7–4.0)
MCH: 31.8 pg (ref 26.0–34.0)
MCHC: 34.6 g/dL (ref 30.0–36.0)
MCV: 91.7 fL (ref 78.0–100.0)
MONO ABS: 0.5 10*3/uL (ref 0.1–1.0)
MONOS PCT: 6 %
NEUTROS ABS: 5.5 10*3/uL (ref 1.7–7.7)
Neutrophils Relative %: 72 %
Platelets: 223 10*3/uL (ref 150–400)
RBC: 4.44 MIL/uL (ref 4.22–5.81)
RDW: 13.3 % (ref 11.5–15.5)
WBC: 7.6 10*3/uL (ref 4.0–10.5)

## 2017-12-05 LAB — LACTIC ACID, PLASMA
LACTIC ACID, VENOUS: 3.3 mmol/L — AB (ref 0.5–1.9)
LACTIC ACID, VENOUS: 2.6 mmol/L — AB (ref 0.5–1.9)

## 2017-12-05 LAB — CBG MONITORING, ED: GLUCOSE-CAPILLARY: 231 mg/dL — AB (ref 70–99)

## 2017-12-05 LAB — TROPONIN I: Troponin I: <0.03 ng/mL (ref <0.03)

## 2017-12-05 MED ORDER — SODIUM CHLORIDE 0.9 % IV BOLUS
500.0000 mL | Freq: Once | INTRAVENOUS | Status: AC
  Administered 2017-12-05: 500 mL via INTRAVENOUS

## 2017-12-05 MED ORDER — SODIUM CHLORIDE 0.9 % IV SOLN: INTRAVENOUS | Status: DC

## 2017-12-05 NOTE — ED Notes (Signed)
ED Provider at bedside. 

## 2017-12-05 NOTE — ED Notes (Signed)
Patient ambulated down hallway in ED with minimal assist and tolerated well.

## 2017-12-05 NOTE — ED Provider Notes (Signed)
[facility] DEPT Provider Note   CSN: [id number] Arrival date & time: 12/05/17  2015     History   Chief Complaint Chief Complaint  Patient presents with  . Weakness    HPI MYAN SUIT is a 77 y.o. male.  The history is provided by the patient and the spouse. The history is limited by the condition of the patient (Hx dementia).  Weakness    Pt was seen at 2035.  Per pt and his wife: c/o gradual onset and worsening of persistent generalized weakness for the past several days. Pt's wife states "we come here when he gets like this to get some fluids." Denies focal motor weakness, no tingling/numbness in extremities, no CP/SOB, no cough, no abd pain, no N/V/D, no fevers, no falls.    Past Medical History:  Diagnosis Date  . Anxiety   . Atrial fibrillation, new onset ([place]) 03/27/2015  . CAD (coronary artery disease)   . Colon polyp    Cscope at [facility] aprox 2006, repeated 2010 (-), next 5 years  . Dementia    per family diagnosed Aug-2016  . Diabetes mellitus with neuropathy ([place])   . Diaphragm paralysis    R, per pt   . DJD (degenerative joint disease)   . Erectile dysfunction   . Eye muscle paralysis    congenital, lateral rectus, left  . Gait disorder   . Heart palpitations   . Hemorrhoids   . Hyperlipidemia   . Microscopic hematuria 2017   seeing urology  . Mild cognitive disorder    Unspecified mild NCD  . Peripheral neuropathy   . Skin cancer    sees derm    Patient Active Problem List   Diagnosis Date Noted  . Gait disorder 02/14/2017  . Lower urinary tract symptoms (LUTS) 02/14/2017  . HLD (hyperlipidemia) 07/17/2016  . GERD (gastroesophageal reflux disease) 07/17/2016  . Depression 07/17/2016  . Chronic diastolic CHF (congestive heart failure) ([place]) 07/17/2016  . Bradycardia 06/15/2016  . PCP NOTES >>>>>> 03/29/2015  . Paroxysmal atrial fibrillation ([place]) 03/27/2015  . Contracture of palmar fascia (Dupuytren's) 04/19/2014   . 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 TRIGGER FINGER, LEFT MIDDLE 06/08/2010  . HAND PAIN, LEFT 06/08/2010  . ANXIETY 12/19/2006  . 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 Dyslipidemia 10/01/2006  . Diabetes ([place]) 05/04/2006  . ERECTILE DYSFUNCTION 05/04/2006  . Essential hypertension 05/04/2006  . Coronary atherosclerosis 05/04/2006  . Osteoarthritis 05/04/2006  . COLONIC POLYPS, HX OF 05/04/2006    Past Surgical History:  Procedure Laterality Date  . ANKLE FUSION Right   . APPENDECTOMY    . CARPAL TUNNEL RELEASE     B, 2000  . CYSTOSCOPY  2017  . HIP SURGERY Right 1964   pins  . HIP SURGERY  2009   RIGHT--s/p hip replacement, s/p revision August 2009 due to dislocation x 3   . HIP SURGERY  04-2012, 2017   R hip revision  . KNEE SURGERY Left 2005  . MEDIAL PARTIAL KNEE REPLACEMENT Right 08-2012  . TOTAL HIP ARTHROPLASTY  08-09-15   [facility]        Home Medications    Prior to Admission medications   Medication Sig Start Date End Date Taking? Authorizing Provider  acetaminophen (TYLENOL) 500 MG tablet Take 1,000 mg by mouth every 6 (six) hours as needed for moderate pain.     [provider]  apixaban (ELIQUIS) 5 MG TABS tablet Take 1 tablet (5 mg  total) by mouth 2 (two) times daily. 11/07/16   Colon Branch, MD  buPROPion (WELLBUTRIN) 75 MG tablet Take 1 tablet (75 mg total) by mouth 2 (two) times daily. 05/25/16   Colon Branch, MD  Cinnamon 500 MG capsule Take 1,000 mg by mouth 2 (two) times daily.     [provider]  insulin glargine (LANTUS) 100 UNIT/ML injection Inject 5 Units into the skin at bedtime.    [provider]  loratadine (CLARITIN) 10 MG tablet Take 10 mg by mouth every morning.  12/02/14   Colon Branch, MD  meclizine (ANTIVERT) 12.5 MG tablet Take 1 tablet (12.5 mg total) by mouth 2 (two) times daily as needed for dizziness. 03/29/17   Colon Branch, MD  memantine (NAMENDA) 10 MG tablet Take 1 tablet (10 mg total) 2 (two) times  daily by mouth. 02/12/17   Tat, Eustace Quail, DO  metFORMIN (GLUCOPHAGE) 1000 MG tablet Take 1 tablet (1,000 mg total) by mouth 2 (two) times daily with a meal. 12/02/14   Colon Branch, MD  Multiple Vitamin (MULTIVITAMIN) tablet Take 1 tablet by mouth every morning.  08/27/12   [provider]  omega-3 acid ethyl esters (LOVAZA) 1 g capsule Take 2 g by mouth 2 (two) times daily.     [provider]  omeprazole (PRILOSEC) 20 MG capsule Take 1 capsule (20 mg total) by mouth daily. 12/02/14   Colon Branch, MD  polyethylene glycol [facility] Floria Raveling) packet Take 17 g by mouth as needed for mild constipation.    [provider]  polyvinyl alcohol (LIQUIFILM TEARS) 1.4 % ophthalmic solution Place 1 drop into both eyes 3 (three) times daily as needed for dry eyes.    [provider]  simvastatin (ZOCOR) 40 MG tablet Take 40 mg by mouth daily.     [provider]  tamsulosin (FLOMAX) 0.4 MG CAPS capsule Take 1 capsule (0.4 mg total) by mouth at bedtime. 01/14/17   Colon Branch, MD    Family History Family History  Problem Relation Age of Onset  . Dementia Father   . Emphysema Father   . Diabetes Maternal Grandmother   . Heart disease Sister   . Stroke Sister   . Heart attack Neg Hx   . Hypertension Neg Hx     Social History Social History   Tobacco Use  . Smoking status: Former Smoker    Last attempt to quit: 01/02/1981    Years since quitting: 36.9  . Smokeless tobacco: Never Used  . Tobacco comment: quit 1980s  Substance Use Topics  . Alcohol use: No    Alcohol/week: 0.0 standard drinks  . Drug use: No     Allergies   Pseudoephedrine   Review of Systems Review of Systems  Unable to perform ROS: Dementia  Neurological: Positive for weakness.     Physical Exam Updated Vital Signs BP 93/63 (BP Location: Left Arm)   Pulse 79   Temp 98.3 F (36.8 C) (Oral)   Resp 18   Ht 6\' 6"  (1.981 m)   Wt 104.3 kg   SpO2 98%   BMI 26.58 kg/m      Patient Vitals for the past 24 hrs:  BP Temp Temp src Pulse Resp SpO2 Height Weight  12/05/17 2300 (!) 113/52 - - 64 20 99 % - -  12/05/17 2230 121/77 - - (!) 49 18 99 % - -  12/05/17 2130 120/65 - - 65 20  98 % - -  12/05/17 2051 137/65 - - 77 (!) 23 100 % - -  12/05/17 2027 - - - - - - 6\' 6"  (1.981 m) 104.3 kg  12/05/17 2020 93/63 98.3 F (36.8 C) Oral 79 18 98 % - -     20:52 Orthostatic Vital Signs SB  Orthostatic Lying   BP- Lying: 121/65   Pulse- Lying: 67       Orthostatic Sitting  BP- Sitting: 137/65   Pulse- Sitting: 84       Orthostatic Standing at 0 minutes  BP- Standing at 0 minutes: 109/60   Pulse- Standing at 0 minutes: 94     22:43 Orthostatic Vital Signs SB  Orthostatic Lying   BP- Lying: 125/61   Pulse- Lying: 54       Orthostatic Sitting  BP- Sitting: 110/74   Pulse- Sitting: 67       Orthostatic Standing at 0 minutes  BP- Standing at 0 minutes: 98/60   Pulse- Standing at 0 minutes: 82     Physical Exam 2040: Physical examination:  Nursing notes reviewed; Vital signs and O2 SAT reviewed;  Constitutional: Well developed, Well nourished, In no acute distress; Head:  Normocephalic, atraumatic; Eyes: EOMI, PERRL, No scleral icterus; ENMT: Mouth and pharynx normal, Mucous membranes dry; Neck: Supple, Full range of motion, No lymphadenopathy; Cardiovascular: Regular rate and rhythm, No gallop; Respiratory: Breath sounds clear & equal bilaterally, No wheezes.  Speaking full sentences with ease, Normal respiratory effort/excursion; Chest: Nontender, Movement normal; Abdomen: Soft, Nontender, Nondistended, Normal bowel sounds; Genitourinary: No CVA tenderness; Extremities: Peripheral pulses normal, No tenderness, No edema, No calf edema or asymmetry.; Neuro: Awake, alert, confused per hx dementia. +chronic left eye deviation, otherwise major CN grossly intact. No facial droop. Speech clear. Grips equal. Strength 5/5 equal bilat UE's and LE's. Pt moves all  extremities spontaneously and to command without apparent gross focal motor deficits.; Skin: Color normal, Warm, Dry.    ED Treatments / Results  Labs (all labs ordered are listed, but only abnormal results are displayed)   EKG EKG Interpretation  Date/Time:  Thursday December 05 2017 20:44:12 EDT Ventricular Rate:  64 PR Interval:    QRS Duration: 107 QT Interval:  399 QTC Calculation: 412 R Axis:   52 Text Interpretation:  Sinus rhythm Prolonged PR interval Baseline wander Artifact When compared with ECG of 11/07/2017 No significant change was found Confirmed by Francine Graven [phone]) on 12/05/2017 8:52:30 PM   Radiology   Procedures Procedures (including critical care time)  Medications Ordered in ED Medications - No data to display   Initial Impression / Assessment and Plan / ED Course  I have reviewed the triage vital signs and the nursing notes.  Pertinent labs & imaging results that were available during my care of the patient were reviewed by me and considered in my medical decision making (see chart for details).  MDM Reviewed: previous chart, nursing note and vitals Reviewed previous: labs and ECG Interpretation: labs, ECG, x-ray and CT scan   Results for orders placed or performed during the hospital encounter of 95/09/32  Basic metabolic panel  Result Value Ref Range   Sodium 137 135 - 145 mmol/L   Potassium 4.6 3.5 - 5.1 mmol/L   Chloride 100 98 - 111 mmol/L   CO2 26 22 - 32 mmol/L   Glucose, Bld 245 (H) 70 - 99 mg/dL   BUN 27 (H) 8 - 23 mg/dL   Creatinine, Ser 0.90 0.61 -  1.24 mg/dL   Calcium 9.1 8.9 - 10.3 mg/dL   GFR calc non Af Amer >60 >60 mL/min   GFR calc Af Amer >60 >60 mL/min   Anion gap 11 5 - 15  Urinalysis, Routine w reflex microscopic  Result Value Ref Range   Color, Urine YELLOW YELLOW   APPearance CLEAR CLEAR   Specific Gravity, Urine 1.021 1.005 - 1.030   pH 5.0 5.0 - 8.0   Glucose, UA NEGATIVE NEGATIVE mg/dL   Hgb urine  dipstick NEGATIVE NEGATIVE   Bilirubin Urine NEGATIVE NEGATIVE   Ketones, ur NEGATIVE NEGATIVE mg/dL   Protein, ur NEGATIVE NEGATIVE mg/dL   Nitrite NEGATIVE NEGATIVE   Leukocytes, UA NEGATIVE NEGATIVE  CBC with Differential  Result Value Ref Range   WBC 7.6 4.0 - 10.5 K/uL   RBC 4.44 4.22 - 5.81 MIL/uL   Hemoglobin 14.1 13.0 - 17.0 g/dL   HCT 40.7 39.0 - 52.0 %   MCV 91.7 78.0 - 100.0 fL   MCH 31.8 26.0 - 34.0 pg   MCHC 34.6 30.0 - 36.0 g/dL   RDW 13.3 11.5 - 15.5 %   Platelets 223 150 - 400 K/uL   Neutrophils Relative % 72 %   Neutro Abs 5.5 1.7 - 7.7 K/uL   Lymphocytes Relative 20 %   Lymphs Abs 1.5 0.7 - 4.0 K/uL   Monocytes Relative 6 %   Monocytes Absolute 0.5 0.1 - 1.0 K/uL   Eosinophils Relative 2 %   Eosinophils Absolute 0.1 0.0 - 0.7 K/uL   Basophils Relative 0 %   Basophils Absolute 0.0 0.0 - 0.1 K/uL  Troponin I  Result Value Ref Range   Troponin I <0.03 <0.03 ng/mL  Lactic acid, plasma  Result Value Ref Range   Lactic Acid, Venous 3.3 (HH) 0.5 - 1.9 mmol/L  Lactic acid, plasma  Result Value Ref Range   Lactic Acid, Venous 2.6 (HH) 0.5 - 1.9 mmol/L  CBG monitoring, ED  Result Value Ref Range   Glucose-Capillary 231 (H) 70 - 99 mg/dL    Dg Chest 2 View Result Date: 12/05/2017 CLINICAL DATA:  Weakness and unsteadiness while walking for 48 hours. Diabetes. EXAM: CHEST - 2 VIEW COMPARISON:  11/07/2017 FINDINGS: The heart size and mediastinal contours are within normal limits. Mild aortic atherosclerosis with uncoiling of the thoracic aorta. No aneurysm. Small granuloma in the right upper lobe is stable. No pulmonary edema or pulmonary consolidation. Minimal atelectasis at the lung bases. Degenerative changes are present along the dorsal spine. IMPRESSION: No active cardiopulmonary disease. Mild uncoiling of the aorta. Tiny right upper lobe pulmonary granuloma. Electronically Signed   By: Ashley Royalty M.D.   On: 12/05/2017 21:33    Ct Head Wo Contrast Result Date:  12/05/2017 CLINICAL DATA:  Weakness EXAM: CT HEAD WITHOUT CONTRAST TECHNIQUE: Contiguous axial images were obtained from the base of the skull through the vertex without intravenous contrast. COMPARISON:  MRI 11/07/2017 and CT FINDINGS: Brain: There is atrophy and chronic small vessel disease changes. No acute intracranial abnormality. Specifically, no hemorrhage, hydrocephalus, mass lesion, acute infarction, or significant intracranial injury. Vascular: No hyperdense vessel or unexpected calcification. Skull: No acute calvarial abnormality. Sinuses/Orbits: Visualized paranasal sinuses and mastoids clear. Orbital soft tissues unremarkable. Other: None IMPRESSION: No acute intracranial abnormality. Atrophy, chronic microvascular disease. Electronically Signed   By: Rolm Baptise M.D.   On: 12/05/2017 21:20    2300:  1st lactic acid elevated. Judicious IVF bolus given. +continues orthostatic after IVF (as above).  2nd lactic acid pending. IVF continues.   2345:   2nd lactic acid starting to trend downward, but still elevated. Pt's BP soft again. Will overnight admit for further IVF. Pt otherwise remains afebrile. Denies CP, abd pain. T/C returned from [facility] Dr. Wynetta Emery, case discussed, including:  HPI, pertinent PM/SHx, VS/PE, dx testing, ED course and treatment:  Agreeable to come to ED for evaluation.        Final Clinical Impressions(s) / ED Diagnoses   Final diagnoses:  None    ED Discharge Orders    None       Francine Graven, DO 12/06/17 2331

## 2017-12-05 NOTE — ED Notes (Signed)
Patient transported to X-ray 

## 2017-12-05 NOTE — ED Triage Notes (Signed)
Pts wife reports that he has been weak and unsteady on his feet x2 days. Pts wife reports pt has diabetes and believes that is why he is weak. Denies dizziness, headache, or any problems with urination.

## 2017-12-05 NOTE — ED Notes (Signed)
Date and time results received: 12/05/17 2148   Test: Lactic Acid  Critical Value: 3.3  Name of Provider Notified: Thurnell Garbe  Waiting for orders

## 2017-12-06 ENCOUNTER — Telehealth: Payer: Self-pay

## 2017-12-06 NOTE — ED Notes (Signed)
Admit Provider at bedside. 

## 2017-12-06 NOTE — ED Notes (Signed)
Pt discharged home per Admit MD order. Went over discharge instructions and follow up care with Pt and Spouse. Both verbalized understanding. IV removed, vitals stable on departure, and Pt wheeled out to car.

## 2017-12-06 NOTE — ED Notes (Signed)
Admit Provider at bedside. Pt and Spouse requesting to leave due to Pt feeling better.

## 2017-12-06 NOTE — Telephone Encounter (Signed)
ED follow up call made to patient. Advised patient that he has appointment scheduled with Dr. Larose Kells on November 5th 2019. Asked patient with his recent ED visit if he felt he needed to be seen sooner. Patient states he does not. Advised patient to call office if he felt the need to be seen at an earlier date. Patient agreed.

## 2017-12-06 NOTE — Discharge Summary (Signed)
  Patient was seen in consultation in the ER.  Requesting ER physician was Dr. Thurnell Garbe.  Patient was brought in by wife with complaint of generalized weakness.  He was given 1 L of IV fluids in the ED.  No infection was found.  He did have a mildly elevated lactic acid and he is on metformin for his diabetes chronically.  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  There have  been no new medication changes.  Patient is quite stoic and does not always follow the directions he hass been given to be safe i.e. use his cane.  Family has many resources including [facility] and support group for dementia for spouses.  They also have a daughter that lives here that also helps to take care of him.  Patient and wife wish to be discharged home.

## 2017-12-07 LAB — URINE CULTURE: Culture: <10000 — AB

## 2017-12-20 ENCOUNTER — Ambulatory Visit (HOSPITAL_BASED_OUTPATIENT_CLINIC_OR_DEPARTMENT_OTHER)
Admission: RE | Admit: 2017-12-20 | Discharge: 2017-12-20 | Disposition: A | Discharge status: Home / Self Care | Payer: Medicare Other | Source: Ambulatory Visit | Attending: Medical | Admitting: Medical

## 2017-12-20 ENCOUNTER — Ambulatory Visit (INDEPENDENT_AMBULATORY_CARE_PROVIDER_SITE_OTHER): Payer: Medicare Other | Admitting: Medical

## 2017-12-20 ENCOUNTER — Encounter: Payer: Self-pay | Admitting: Medical

## 2017-12-20 VITALS — BP 117/64 | HR 80 | Temp 98.4°F | Ht 78.0 in | Wt 220.6 lb

## 2017-12-20 DIAGNOSIS — J40 Bronchitis, not specified as acute or chronic: Secondary | ICD-10-CM | POA: Diagnosis not present

## 2017-12-20 DIAGNOSIS — R0989 Other specified symptoms and signs involving the circulatory and respiratory systems: Secondary | ICD-10-CM | POA: Diagnosis not present

## 2017-12-20 DIAGNOSIS — R059 Cough, unspecified: Secondary | ICD-10-CM

## 2017-12-20 DIAGNOSIS — R05 Cough: Secondary | ICD-10-CM | POA: Insufficient documentation

## 2017-12-20 DIAGNOSIS — R0981 Nasal congestion: Secondary | ICD-10-CM

## 2017-12-20 DIAGNOSIS — R062 Wheezing: Secondary | ICD-10-CM

## 2017-12-20 MED ORDER — ALBUTEROL SULFATE HFA 108 (90 BASE) MCG/ACT IN AERS: 2.0000 | INHALATION_SPRAY | Freq: Four times a day (QID) | RESPIRATORY_TRACT | 2 refills | Status: AC | PRN

## 2017-12-20 MED ORDER — FLUTICASONE PROPIONATE 50 MCG/ACT NA SUSP: 2.0000 | Freq: Every day | NASAL | 1 refills | Status: DC

## 2017-12-20 MED ORDER — BENZONATATE 100 MG PO CAPS: 100.0000 mg | ORAL_CAPSULE | Freq: Three times a day (TID) | ORAL | 0 refills | Status: DC | PRN

## 2017-12-20 MED ORDER — DOXYCYCLINE HYCLATE 100 MG PO TABS: 100.0000 mg | ORAL_TABLET | Freq: Two times a day (BID) | ORAL | 0 refills | Status: DC

## 2017-12-20 NOTE — Progress Notes (Signed)
Subjective:    Patient ID: Ivan Anderson, male    DOB: 1940/07/17, 77 y.o.   MRN: [id number]  HPI  Pt in for cough x 1 wk. Wife states sounds like chest rattles. Pt states he will occasional bring up mucus. Can't bring up mucus completely but feels can taste mucus. Also some nasal congestion recently but no sinus pressure.   No fever, no chills or sweats. But over past week little more tired than usual.  Wife states he wheezes occasional. Pt denies.  Pt had remote hx of smoking. He stopped smoking around year 2000.   Review of Systems  Constitutional: Negative for chills, fatigue and fever.  HENT: Positive for congestion. Negative for ear pain.   Respiratory: Positive for cough and wheezing. Negative for chest tightness and shortness of breath.        Chest congestion.  Cardiovascular: Negative for chest pain and palpitations.  Gastrointestinal: Negative for abdominal pain.  Musculoskeletal: Negative for back pain.       No leg pain.  Skin: Negative for rash.  Neurological: Negative for dizziness, seizures, speech difficulty, weakness and light-headedness.  Hematological: Negative for adenopathy. Does not bruise/bleed easily.  Psychiatric/Behavioral: Negative for behavioral problems, confusion and dysphoric mood. The patient is not nervous/anxious.     Past Medical History:  Diagnosis Date  . Anxiety   . Atrial fibrillation, new onset ([place]) 03/27/2015  . CAD (coronary artery disease)   . Colon polyp    Cscope at [facility] aprox 2006, repeated 2010 (-), next 5 years  . Dementia    per family diagnosed Aug-2016  . Diabetes mellitus with neuropathy ([place])   . Diaphragm paralysis    R, per pt   . DJD (degenerative joint disease)   . Erectile dysfunction   . Eye muscle paralysis    congenital, lateral rectus, left  . Gait disorder   . Heart palpitations   . Hemorrhoids   . Hyperlipidemia   . Microscopic hematuria 2017   seeing urology  . Mild cognitive disorder    Unspecified  mild NCD  . Peripheral neuropathy   . Skin cancer    sees derm     Social History   Socioeconomic History  . Marital status: Married    Spouse name: Not on file  . Number of children: Not on file  . Years of education: Not on file  . Highest education level: Not on file  Occupational History  . Occupation: Cabin crew  Social Needs  . Financial resource strain: Not on file  . Food insecurity:    Worry: Not on file    Inability: Not on file  . Transportation needs:    Medical: Not on file    Non-medical: Not on file  Tobacco Use  . Smoking status: Former Smoker    Last attempt to quit: 01/02/1981    Years since quitting: 36.9  . Smokeless tobacco: Never Used  . Tobacco comment: quit 1980s  Substance and Sexual Activity  . Alcohol use: No    Alcohol/week: 0.0 standard drinks  . Drug use: No  . Sexual activity: Not on file  Lifestyle  . Physical activity:    Days per week: Not on file    Minutes per session: Not on file  . Stress: Not on file  Relationships  . Social connections:    Talks on phone: Not on file    Gets together: Not on file    Attends religious service: Not on file  Active member of club or organization: Not on file    Attends meetings of clubs or organizations: Not on file    Relationship status: Not on file  . Intimate partner violence:    Fear of current or ex partner: Not on file    Emotionally abused: Not on file    Physically abused: Not on file    Forced sexual activity: Not on file  Other Topics Concern  . Not on file  Social History Narrative  . Not on file    Past Surgical History:  Procedure Laterality Date  . ANKLE FUSION Right   . APPENDECTOMY    . CARPAL TUNNEL RELEASE     B, 2000  . CYSTOSCOPY  2017  . HIP SURGERY Right 1964   pins  . HIP SURGERY  2009   RIGHT--s/p hip replacement, s/p revision August 2009 due to dislocation x 3   . HIP SURGERY  04-2012, 2017   R hip revision  . KNEE SURGERY Left 2005  . MEDIAL PARTIAL  KNEE REPLACEMENT Right 08-2012  . TOTAL HIP ARTHROPLASTY  08-09-15   [facility]    Family History  Problem Relation Age of Onset  . Dementia Father   . Emphysema Father   . Diabetes Maternal Grandmother   . Heart disease Sister   . Stroke Sister   . Heart attack Neg Hx   . Hypertension Neg Hx     Allergies  Allergen Reactions  . Pseudoephedrine Other (See Comments)    Dx: A. Fib.     Current Outpatient Medications on File Prior to Visit  Medication Sig Dispense Refill  . acetaminophen (TYLENOL) 500 MG tablet Take 1,000 mg by mouth every 6 (six) hours as needed for moderate pain.     Marland Kitchen apixaban (ELIQUIS) 5 MG TABS tablet Take 1 tablet (5 mg total) by mouth 2 (two) times daily. 180 tablet 1  . buPROPion (WELLBUTRIN) 75 MG tablet Take 1 tablet (75 mg total) by mouth 2 (two) times daily. 60 tablet 1  . Cinnamon 500 MG capsule Take 1,000 mg by mouth 2 (two) times daily.     . insulin glargine (LANTUS) 100 UNIT/ML injection Inject 5 Units into the skin at bedtime.    Marland Kitchen loratadine (CLARITIN) 10 MG tablet Take 10 mg by mouth every morning.     . meclizine (ANTIVERT) 12.5 MG tablet Take 1 tablet (12.5 mg total) by mouth 2 (two) times daily as needed for dizziness. 60 tablet 0  . memantine (NAMENDA) 10 MG tablet Take 1 tablet (10 mg total) 2 (two) times daily by mouth. 60 tablet 4  . metFORMIN (GLUCOPHAGE) 1000 MG tablet Take 1 tablet (1,000 mg total) by mouth 2 (two) times daily with a meal.    . Multiple Vitamin (MULTIVITAMIN) tablet Take 1 tablet by mouth every morning.     Marland Kitchen omega-3 acid ethyl esters (LOVAZA) 1 g capsule Take 2 g by mouth 2 (two) times daily.     Marland Kitchen omeprazole (PRILOSEC) 20 MG capsule Take 1 capsule (20 mg total) by mouth daily.    . polyethylene glycol (MIRALAX / GLYCOLAX) packet Take 17 g by mouth as needed for mild constipation.    . polyvinyl alcohol (LIQUIFILM TEARS) 1.4 % ophthalmic solution Place 1 drop into both eyes 3 (three) times daily as needed for dry  eyes.    . simvastatin (ZOCOR) 40 MG tablet Take 40 mg by mouth daily.     . tamsulosin (FLOMAX) 0.4  MG CAPS capsule Take 1 capsule (0.4 mg total) by mouth at bedtime. 30 capsule 5   No current facility-administered medications on file prior to visit.     BP 117/64 (BP Location: Left Arm, Patient Position: Sitting, Cuff Size: Normal)   Pulse 80   Temp 98.4 F (36.9 C) (Oral)   Ht 6\' 6"  (1.981 m)   Wt 220 lb 9.6 oz (100.1 kg)   SpO2 99%   BMI 25.49 kg/m       Objective:   Physical Exam  General  Mental Status - Alert. General Appearance - Well groomed. Not in acute distress.sounds nasal congested  Skin Rashes- No Rashes.  HEENT Head- Normal. Ear Auditory Canal - Left- Normal. Right - Normal.Tympanic Membrane- Left- Normal. Right- Normal. Eye Sclera/Conjunctiva- Left- Normal. Right- Normal. Nose & Sinuses Nasal Mucosa- Left-  Boggy and Congested. Right-  Boggy and  Congested.Bilateral no maxillary and no frontal sinus pressure. Mouth & Throat Lips: Upper Lip- Normal: no dryness, cracking, pallor, cyanosis, or vesicular eruption. Lower Lip-Normal: no dryness, cracking, pallor, cyanosis or vesicular eruption. Buccal Mucosa- Bilateral- No Aphthous ulcers. Oropharynx- No Discharge or Erythema. Tonsils: Characteristics- Bilateral- No Erythema or Congestion. Size/Enlargement- Bilateral- No enlargement. Discharge- bilateral-None.  Neck Neck- Supple. No Masses.   Chest and Lung Exam Auscultation: Breath Sounds:- even and unlabored. Mild shallow bilaterally.  Cardiovascular Auscultation:Rythm- Regular, rate and rhythm. Murmurs & Other Heart Sounds:Ausculatation of the heart reveal- No Murmurs.  Lymphatic Head & Neck General Head & Neck Lymphatics: Bilateral: Description- No Localized lymphadenopathy.   Lower ext- no pedal edema bilaterally.      Assessment & Plan:  You appear to have bronchitis. Rest hydrate and tylenol for fever. I am prescribing cough medicine  benzonatate, and doxycline antibiotic. For your nasal congestion rx flonase.  I did send in albuterol inhaler. Use as discussed. Only for repetitive obvious constant wheezing.  Please get chest xray today.  Follow up in 7-10 days or as needed  [employer], [employer]

## 2017-12-20 NOTE — Patient Instructions (Addendum)
You appear to have bronchitis. Rest hydrate and tylenol for fever. I am prescribing cough medicine benzonatate, and doxycline antibiotic. For your nasal congestion rx flonase.  I did send in albuterol inhaler. Use as discussed. Only for repetitive obvious constant wheezing.  Please get chest xray today.  Follow up in 7-10 days or as needed

## 2018-01-18 DIAGNOSIS — Z0279 Encounter for issue of other medical certificate: Secondary | ICD-10-CM

## 2018-01-19 ENCOUNTER — Observation Stay (HOSPITAL_COMMUNITY)
Admission: EM | Admit: 2018-01-19 | Discharge: 2018-01-22 | Disposition: A | Discharge status: Skilled Nursing Facility | Payer: Medicare Other | Attending: Internal Medicine | Admitting: Internal Medicine

## 2018-01-19 ENCOUNTER — Other Ambulatory Visit: Payer: Self-pay

## 2018-01-19 ENCOUNTER — Observation Stay (HOSPITAL_COMMUNITY): Payer: Medicare Other

## 2018-01-19 ENCOUNTER — Emergency Department (HOSPITAL_COMMUNITY): Payer: Medicare Other

## 2018-01-19 ENCOUNTER — Encounter (HOSPITAL_COMMUNITY): Payer: Self-pay | Admitting: Emergency Medicine

## 2018-01-19 DIAGNOSIS — Z85828 Personal history of other malignant neoplasm of skin: Secondary | ICD-10-CM | POA: Diagnosis not present

## 2018-01-19 DIAGNOSIS — Z87891 Personal history of nicotine dependence: Secondary | ICD-10-CM | POA: Insufficient documentation

## 2018-01-19 DIAGNOSIS — E860 Dehydration: Principal | ICD-10-CM | POA: Insufficient documentation

## 2018-01-19 DIAGNOSIS — F0150 Vascular dementia without behavioral disturbance: Secondary | ICD-10-CM | POA: Diagnosis not present

## 2018-01-19 DIAGNOSIS — K5900 Constipation, unspecified: Secondary | ICD-10-CM | POA: Insufficient documentation

## 2018-01-19 DIAGNOSIS — Z7901 Long term (current) use of anticoagulants: Secondary | ICD-10-CM | POA: Insufficient documentation

## 2018-01-19 DIAGNOSIS — R531 Weakness: Secondary | ICD-10-CM

## 2018-01-19 DIAGNOSIS — I5032 Chronic diastolic (congestive) heart failure: Secondary | ICD-10-CM | POA: Diagnosis not present

## 2018-01-19 DIAGNOSIS — R270 Ataxia, unspecified: Secondary | ICD-10-CM

## 2018-01-19 DIAGNOSIS — I2510 Atherosclerotic heart disease of native coronary artery without angina pectoris: Secondary | ICD-10-CM | POA: Diagnosis not present

## 2018-01-19 DIAGNOSIS — F329 Major depressive disorder, single episode, unspecified: Secondary | ICD-10-CM | POA: Insufficient documentation

## 2018-01-19 DIAGNOSIS — I480 Paroxysmal atrial fibrillation: Secondary | ICD-10-CM | POA: Diagnosis not present

## 2018-01-19 DIAGNOSIS — Z794 Long term (current) use of insulin: Secondary | ICD-10-CM | POA: Diagnosis not present

## 2018-01-19 DIAGNOSIS — Z79899 Other long term (current) drug therapy: Secondary | ICD-10-CM | POA: Insufficient documentation

## 2018-01-19 DIAGNOSIS — I10 Essential (primary) hypertension: Secondary | ICD-10-CM | POA: Diagnosis present

## 2018-01-19 DIAGNOSIS — K219 Gastro-esophageal reflux disease without esophagitis: Secondary | ICD-10-CM | POA: Insufficient documentation

## 2018-01-19 DIAGNOSIS — R112 Nausea with vomiting, unspecified: Secondary | ICD-10-CM | POA: Diagnosis not present

## 2018-01-19 DIAGNOSIS — E785 Hyperlipidemia, unspecified: Secondary | ICD-10-CM | POA: Diagnosis not present

## 2018-01-19 DIAGNOSIS — F419 Anxiety disorder, unspecified: Secondary | ICD-10-CM | POA: Diagnosis not present

## 2018-01-19 DIAGNOSIS — N400 Enlarged prostate without lower urinary tract symptoms: Secondary | ICD-10-CM | POA: Insufficient documentation

## 2018-01-19 DIAGNOSIS — R42 Dizziness and giddiness: Secondary | ICD-10-CM | POA: Diagnosis not present

## 2018-01-19 DIAGNOSIS — R195 Other fecal abnormalities: Secondary | ICD-10-CM | POA: Diagnosis not present

## 2018-01-19 DIAGNOSIS — E1140 Type 2 diabetes mellitus with diabetic neuropathy, unspecified: Secondary | ICD-10-CM | POA: Diagnosis not present

## 2018-01-19 DIAGNOSIS — I700 Atherosclerosis of aorta: Secondary | ICD-10-CM | POA: Insufficient documentation

## 2018-01-19 DIAGNOSIS — I110 Hypertensive heart disease with heart failure: Secondary | ICD-10-CM | POA: Insufficient documentation

## 2018-01-19 DIAGNOSIS — N2889 Other specified disorders of kidney and ureter: Secondary | ICD-10-CM | POA: Diagnosis not present

## 2018-01-19 LAB — COMPREHENSIVE METABOLIC PANEL
ALT: 22 U/L (ref 0–44)
AST: 23 U/L (ref 15–41)
Albumin: 3.6 g/dL (ref 3.5–5.0)
Alkaline Phosphatase: 96 U/L (ref 38–126)
Anion gap: 15 (ref 5–15)
BUN: 25 mg/dL — ABNORMAL HIGH (ref 8–23)
CHLORIDE: 101 mmol/L (ref 98–111)
CO2: 21 mmol/L — ABNORMAL LOW (ref 22–32)
CREATININE: 0.8 mg/dL (ref 0.61–1.24)
Calcium: 8.9 mg/dL (ref 8.9–10.3)
Glucose, Bld: 228 mg/dL — ABNORMAL HIGH (ref 70–99)
POTASSIUM: 4.3 mmol/L (ref 3.5–5.1)
Sodium: 137 mmol/L (ref 135–145)
Total Bilirubin: 0.2 mg/dL — ABNORMAL LOW (ref 0.3–1.2)
Total Protein: 6.3 g/dL — ABNORMAL LOW (ref 6.5–8.1)

## 2018-01-19 LAB — CBC WITH DIFFERENTIAL/PLATELET
ABS IMMATURE GRANULOCYTES: 0.02 10*3/uL (ref 0.00–0.07)
BASOS PCT: 1 %
Basophils Absolute: 0 10*3/uL (ref 0.0–0.1)
Eosinophils Absolute: 0.1 10*3/uL (ref 0.0–0.5)
Eosinophils Relative: 1 %
HCT: 39.2 % (ref 39.0–52.0)
Hemoglobin: 13 g/dL (ref 13.0–17.0)
Immature Granulocytes: 0 %
Lymphocytes Relative: 12 %
Lymphs Abs: 0.9 10*3/uL (ref 0.7–4.0)
MCH: 31.1 pg (ref 26.0–34.0)
MCHC: 33.2 g/dL (ref 30.0–36.0)
MCV: 93.8 fL (ref 80.0–100.0)
MONO ABS: 0.4 10*3/uL (ref 0.1–1.0)
Monocytes Relative: 6 %
NEUTROS ABS: 6 10*3/uL (ref 1.7–7.7)
NEUTROS PCT: 80 %
Platelets: 196 10*3/uL (ref 150–400)
RBC: 4.18 MIL/uL — AB (ref 4.22–5.81)
RDW: 13.1 % (ref 11.5–15.5)
WBC: 7.5 10*3/uL (ref 4.0–10.5)
nRBC: 0 % (ref 0.0–0.2)

## 2018-01-19 LAB — URINALYSIS, ROUTINE W REFLEX MICROSCOPIC
BILIRUBIN URINE: NEGATIVE
GLUCOSE, UA: NEGATIVE mg/dL
HGB URINE DIPSTICK: NEGATIVE
KETONES UR: NEGATIVE mg/dL
LEUKOCYTES UA: NEGATIVE
NITRITE: NEGATIVE
PROTEIN: NEGATIVE mg/dL
SPECIFIC GRAVITY, URINE: 1.019 (ref 1.005–1.030)
pH: 5 (ref 5.0–8.0)

## 2018-01-19 LAB — I-STAT CG4 LACTIC ACID, ED
Lactic Acid, Venous: 4.95 mmol/L (ref 0.5–1.9)
Lactic Acid, Venous: 2.1 mmol/L (ref 0.5–1.9)

## 2018-01-19 LAB — I-STAT TROPONIN, ED: TROPONIN I, POC: 0 ng/mL (ref 0.00–0.08)

## 2018-01-19 LAB — TROPONIN I

## 2018-01-19 LAB — LIPASE, BLOOD: LIPASE: 34 U/L (ref 11–51)

## 2018-01-19 LAB — GLUCOSE, CAPILLARY: GLUCOSE-CAPILLARY: 90 mg/dL (ref 70–99)

## 2018-01-19 MED ORDER — BUPROPION HCL 75 MG PO TABS
75.0000 mg | ORAL_TABLET | Freq: Two times a day (BID) | ORAL | Status: DC
  Administered 2018-01-19 – 2018-01-22 (×6): 75 mg via ORAL
  Filled 2018-01-19 (×7): qty 1

## 2018-01-19 MED ORDER — ALBUTEROL SULFATE (2.5 MG/3ML) 0.083% IN NEBU: 2.5000 mg | INHALATION_SOLUTION | Freq: Four times a day (QID) | RESPIRATORY_TRACT | Status: DC | PRN

## 2018-01-19 MED ORDER — SODIUM CHLORIDE 0.9 % IJ SOLN
INTRAMUSCULAR | Status: AC
  Filled 2018-01-19: qty 50

## 2018-01-19 MED ORDER — APIXABAN 5 MG PO TABS
5.0000 mg | ORAL_TABLET | Freq: Two times a day (BID) | ORAL | Status: DC
  Administered 2018-01-19 – 2018-01-22 (×6): 5 mg via ORAL
  Filled 2018-01-19 (×2): qty 1
  Filled 2018-01-19: qty 2
  Filled 2018-01-19 (×4): qty 1

## 2018-01-19 MED ORDER — INSULIN GLARGINE 100 UNIT/ML ~~LOC~~ SOLN
5.0000 [IU] | Freq: Every day | SUBCUTANEOUS | Status: DC
  Administered 2018-01-19 – 2018-01-21 (×3): 5 [IU] via SUBCUTANEOUS
  Filled 2018-01-19 (×4): qty 0.05

## 2018-01-19 MED ORDER — MEMANTINE HCL 10 MG PO TABS
10.0000 mg | ORAL_TABLET | Freq: Two times a day (BID) | ORAL | Status: DC
  Administered 2018-01-19 – 2018-01-22 (×6): 10 mg via ORAL
  Filled 2018-01-19 (×6): qty 1

## 2018-01-19 MED ORDER — ONDANSETRON HCL 4 MG PO TABS: 4.0000 mg | ORAL_TABLET | Freq: Four times a day (QID) | ORAL | Status: DC | PRN

## 2018-01-19 MED ORDER — INSULIN ASPART 100 UNIT/ML ~~LOC~~ SOLN
0.0000 [IU] | Freq: Three times a day (TID) | SUBCUTANEOUS | Status: DC
  Administered 2018-01-20: 2 [IU] via SUBCUTANEOUS
  Administered 2018-01-21: 3 [IU] via SUBCUTANEOUS
  Administered 2018-01-21: 2 [IU] via SUBCUTANEOUS
  Administered 2018-01-22: 3 [IU] via SUBCUTANEOUS

## 2018-01-19 MED ORDER — IOPAMIDOL (ISOVUE-300) INJECTION 61%
INTRAVENOUS | Status: AC
  Filled 2018-01-19: qty 100

## 2018-01-19 MED ORDER — SODIUM CHLORIDE 0.9 % IV BOLUS
1000.0000 mL | Freq: Once | INTRAVENOUS | Status: AC
  Administered 2018-01-19: 1000 mL via INTRAVENOUS

## 2018-01-19 MED ORDER — PANTOPRAZOLE SODIUM 40 MG PO TBEC
40.0000 mg | DELAYED_RELEASE_TABLET | Freq: Every day | ORAL | Status: DC
  Administered 2018-01-19 – 2018-01-22 (×4): 40 mg via ORAL
  Filled 2018-01-19 (×4): qty 1

## 2018-01-19 MED ORDER — IOPAMIDOL (ISOVUE-300) INJECTION 61%
100.0000 mL | Freq: Once | INTRAVENOUS | Status: AC | PRN
  Administered 2018-01-19: 100 mL via INTRAVENOUS

## 2018-01-19 MED ORDER — VITAMIN D3 25 MCG (1000 UNIT) PO TABS
1000.0000 [IU] | ORAL_TABLET | Freq: Every day | ORAL | Status: DC
  Administered 2018-01-20 – 2018-01-22 (×3): 1000 [IU] via ORAL
  Filled 2018-01-19 (×3): qty 1

## 2018-01-19 MED ORDER — SODIUM CHLORIDE 0.9 % IV SOLN
INTRAVENOUS | Status: DC
  Administered 2018-01-19: 23:00:00 via INTRAVENOUS

## 2018-01-19 MED ORDER — ONDANSETRON HCL 4 MG/2ML IJ SOLN: 4.0000 mg | Freq: Four times a day (QID) | INTRAMUSCULAR | Status: DC | PRN

## 2018-01-19 MED ORDER — OMEGA-3-ACID ETHYL ESTERS 1 G PO CAPS
2.0000 g | ORAL_CAPSULE | Freq: Two times a day (BID) | ORAL | Status: DC
  Administered 2018-01-19 – 2018-01-22 (×6): 2 g via ORAL
  Filled 2018-01-19 (×6): qty 2

## 2018-01-19 MED ORDER — ACETAMINOPHEN 650 MG RE SUPP: 650.0000 mg | Freq: Four times a day (QID) | RECTAL | Status: DC | PRN

## 2018-01-19 MED ORDER — SIMVASTATIN 40 MG PO TABS
40.0000 mg | ORAL_TABLET | Freq: Every day | ORAL | Status: DC
  Administered 2018-01-19 – 2018-01-22 (×4): 40 mg via ORAL
  Filled 2018-01-19 (×4): qty 1

## 2018-01-19 MED ORDER — MECLIZINE HCL 25 MG PO TABS
12.5000 mg | ORAL_TABLET | Freq: Every day | ORAL | Status: DC
  Administered 2018-01-19 – 2018-01-21 (×3): 12.5 mg via ORAL
  Filled 2018-01-19 (×3): qty 1

## 2018-01-19 MED ORDER — ACETAMINOPHEN 325 MG PO TABS: 650.0000 mg | ORAL_TABLET | Freq: Four times a day (QID) | ORAL | Status: DC | PRN

## 2018-01-19 NOTE — ED Notes (Signed)
Ed PA Ria Comment and Ed provider [employer] notified patient has a critical lactic acid value of 4.95

## 2018-01-19 NOTE — ED Notes (Signed)
Unable to complete orthostatic vital sign. PT c/o dizziness while sitting on side of bed. PA have been made aware

## 2018-01-19 NOTE — ED Provider Notes (Signed)
[place] DEPT Provider Note   CSN: [id number] Arrival date & time: 01/19/18  1324     History   Chief Complaint Chief Complaint  Patient presents with  . Dizziness  . Fatigue  . Constipation    HPI Ivan Anderson is a 77 y.o. male with PMH/o Afib (on Eliquis), CAD, Dementia, Gait disorder, HLD, who presents for evaluation of dizziness x3 days.  Wife states that the symptoms were getting worse, how to ED visit.  Patient states that when he gets up from a laying or sitting position, he feels like he is going to fall backwards.  Additionally, he feels like when he tries to walk, he is going to fall down.  He denies any room spinning sensation.  Wife reports that this is happened before usually when he is not drinking enough fluids.  She states he has not recently been sick.  Patient does report that he has had constipation x1 week.  He states that his last bowel movement was approximately 1 week ago.  Wife denies any new medications.  He has been tolerating p.o. without any difficulty he denies any vomiting.  Patient had appendectomy when he was a teenager but no other abdominal surgeries.  He does not think he has been passing flatus.  EM LEVEL 5 CAVEAT DUE TO DEMENTIA   The history is provided by the spouse.    Past Medical History:  Diagnosis Date  . Anxiety   . Atrial fibrillation, new onset ([place]) 03/27/2015  . CAD (coronary artery disease)   . Colon polyp    Cscope at [facility] aprox 2006, repeated 2010 (-), next 5 years  . Dementia ([place])    per family diagnosed Aug-2016  . Diabetes mellitus with neuropathy ([place])   . Diaphragm paralysis    R, per pt   . DJD (degenerative joint disease)   . Erectile dysfunction   . Eye muscle paralysis    congenital, lateral rectus, left  . Gait disorder   . Heart palpitations   . Hemorrhoids   . Hyperlipidemia   . Microscopic hematuria 2017   seeing urology  . Mild cognitive disorder    Unspecified mild NCD    . Peripheral neuropathy   . Skin cancer    sees derm    Patient Active Problem List   Diagnosis Date Noted  . Ataxia 01/19/2018  . Dizziness 01/19/2018  . Weakness generalized 12/05/2017  . 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 Lower urinary tract symptoms (LUTS) 02/14/2017  . HLD (hyperlipidemia) 07/17/2016  . GERD (gastroesophageal reflux disease) 07/17/2016  . Depression 07/17/2016  . Chronic diastolic CHF (congestive heart failure) ([place]) 07/17/2016  . Bradycardia 06/15/2016  . PCP NOTES >>>>>> 03/29/2015  . Paroxysmal atrial fibrillation ([place]) 03/27/2015  . 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 Dementia, vascular ([place]) 12/23/2013  . TRIGGER FINGER, LEFT MIDDLE 06/08/2010  . HAND PAIN, LEFT 06/08/2010  . ANXIETY 12/19/2006  . 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 Dyslipidemia 10/01/2006  . Diabetes ([place]) 05/04/2006  . ERECTILE DYSFUNCTION 05/04/2006  . Essential hypertension 05/04/2006  . Coronary atherosclerosis 05/04/2006  . Osteoarthritis 05/04/2006  . COLONIC POLYPS, HX OF 05/04/2006    Past Surgical History:  Procedure Laterality Date  . ANKLE FUSION Right   . APPENDECTOMY    . CARPAL TUNNEL RELEASE     B, 2000  . CYSTOSCOPY  2017  . HIP SURGERY Right 1964   pins  . HIP SURGERY  2009   RIGHT--s/p hip replacement, s/p revision August 2009 due to dislocation x 3   . HIP SURGERY  04-2012, 2017   R hip revision  . KNEE SURGERY Left 2005  . MEDIAL PARTIAL KNEE REPLACEMENT Right 08-2012  . TOTAL HIP ARTHROPLASTY  08-09-15   [facility]        Home Medications    Prior to Admission medications   Medication Sig Start Date End Date Taking? Authorizing Provider  acetaminophen (TYLENOL) 500 MG tablet Take 1,000 mg by mouth every 6 (six) hours as needed for moderate pain.    Yes [provider]  albuterol (PROVENTIL HFA;VENTOLIN HFA) 108 (90 Base) MCG/ACT inhaler Inhale 2 puffs into the lungs every 6 (six) hours as needed for  wheezing or shortness of breath. 12/20/17  Yes Saguier, Percell Miller, PA-C  apixaban (ELIQUIS) 5 MG TABS tablet Take 1 tablet (5 mg total) by mouth 2 (two) times daily. 11/07/16  Yes Paz, Alda Berthold, MD  buPROPion (WELLBUTRIN) 75 MG tablet Take 1 tablet (75 mg total) by mouth 2 (two) times daily. 05/25/16  Yes Paz, Alda Berthold, MD  cholecalciferol (VITAMIN D) 1000 units tablet Take 1,000 Units by mouth daily.   Yes [provider]  Cinnamon 500 MG capsule Take 1,000 mg by mouth 2 (two) times daily.    Yes [provider]  insulin glargine (LANTUS) 100 UNIT/ML injection Inject 5 Units into the skin at bedtime.   Yes [provider]  Iron-Vitamins (GERITOL COMPLETE PO) Take 1 tablet by mouth daily.   Yes [provider]  meclizine (ANTIVERT) 12.5 MG tablet Take 1 tablet (12.5 mg total) by mouth 2 (two) times daily as needed for dizziness. Patient taking differently: Take 12.5 mg by mouth at bedtime.  03/29/17  Yes Paz, Alda Berthold, MD  memantine (NAMENDA) 10 MG tablet Take 1 tablet (10 mg total) 2 (two) times daily by mouth. 02/12/17  Yes Tat, Eustace Quail, DO  metFORMIN (GLUCOPHAGE) 1000 MG tablet Take 1 tablet (1,000 mg total) by mouth 2 (two) times daily with a meal. 12/02/14  Yes Colon Branch, MD  Multiple Vitamin (MULTIVITAMIN) tablet Take 1 tablet by mouth every morning.  08/27/12  Yes [provider]  omega-3 acid ethyl esters (LOVAZA) 1 g capsule Take 2 g by mouth 2 (two) times daily.    Yes [provider]  omeprazole (PRILOSEC) 20 MG capsule Take 20 mg by mouth daily as needed (heart burn).  12/02/14  Yes Paz, Alda Berthold, MD  polyvinyl alcohol (LIQUIFILM TEARS) 1.4 % ophthalmic solution Place 1 drop into both eyes 3 (three) times daily as needed for dry eyes.   Yes [provider]  simvastatin (ZOCOR) 40 MG tablet Take 40 mg by mouth daily.    Yes [provider]  vitamin C (ASCORBIC ACID) 500 MG tablet Take 500 mg by mouth daily.   Yes [provider]  benzonatate (TESSALON) 100 MG capsule Take 1 capsule (100 mg total) by mouth 3 (three) times daily as needed for cough. Patient not taking: Reported on 01/19/2018 12/20/17   Saguier, Percell Miller, PA-C  doxycycline (VIBRA-TABS) 100 MG tablet Take 1 tablet (100 mg total) by mouth 2 (two) times daily. Patient not taking: Reported on 01/19/2018 12/20/17   Saguier, Percell Miller, PA-C  fluticasone [facility]) 50 MCG/ACT nasal spray Place 2 sprays into both nostrils daily. Patient not taking: Reported on 01/19/2018 12/20/17   Saguier, Percell Miller, PA-C  tamsulosin (FLOMAX) 0.4 MG CAPS capsule Take  1 capsule (0.4 mg total) by mouth at bedtime. Patient not taking: Reported on 01/19/2018 01/14/17   Colon Branch, MD    Family History Family History  Problem Relation Age of Onset  . Dementia Father   . Emphysema Father   . Diabetes Maternal Grandmother   . Heart disease Sister   . Stroke Sister   . Heart attack Neg Hx   . Hypertension Neg Hx     Social History Social History   Tobacco Use  . Smoking status: Former Smoker    Last attempt to quit: 01/02/1981    Years since quitting: 37.0  . Smokeless tobacco: Never Used  . Tobacco comment: quit 1980s  Substance Use Topics  . Alcohol use: No    Alcohol/week: 0.0 standard drinks  . Drug use: No     Allergies   Pseudoephedrine   Review of Systems Review of Systems  Unable to perform ROS: Dementia     Physical Exam Updated Vital Signs BP (!) 105/94   Pulse 65   Temp (!) 97.5 F (36.4 C) (Oral)   Resp (!) 27   Ht 6\' 6"  (1.981 m)   Wt 100.7 kg   SpO2 99%   BMI 25.65 kg/m   Physical Exam  Constitutional: He is oriented to person, place, and time. He appears well-developed and well-nourished.  HENT:  Head: Normocephalic and atraumatic.  Mouth/Throat: Oropharynx is clear and moist. Mucous membranes are dry.  Eyes: Pupils are equal, round, and reactive to light. Conjunctivae and lids are normal. Left eye exhibits abnormal  extraocular motion.  Left eye with lateral paralysis.  Neck: Full passive range of motion without pain.  Cardiovascular: Normal rate, regular rhythm, normal heart sounds and normal pulses. Exam reveals no gallop and no friction rub.  No murmur heard. Pulmonary/Chest: Breath sounds normal.  Lungs clear to auscultation bilaterally.  Symmetric chest rise.  No wheezing, rales, rhonchi. Slight increased work of breathing. Able to speak in full sentences without any difficulty.   Abdominal: Soft. Normal appearance. Bowel sounds are decreased. There is no tenderness. There is no rigidity and no guarding.  Abdomen is soft, non-distended, non-tender. No rigidity, No guarding. No peritoneal signs.  Musculoskeletal: Normal range of motion.  Neurological: He is alert and oriented to person, place, and time.  Skin: Skin is warm and dry. Capillary refill takes less than 2 seconds.  Psychiatric: He has a normal mood and affect. His speech is normal.  Nursing note and vitals reviewed.    ED Treatments / Results  Labs (all labs ordered are listed, but only abnormal results are displayed) Labs Reviewed  COMPREHENSIVE METABOLIC PANEL - Abnormal; Notable for the following components:      Result Value   CO2 21 (*)    Glucose, Bld 228 (*)    BUN 25 (*)    Total Protein 6.3 (*)    Total Bilirubin 0.2 (*)    All other components within normal limits  CBC WITH DIFFERENTIAL/PLATELET - Abnormal; Notable for the following components:   RBC 4.18 (*)    All other components within normal limits  URINALYSIS, ROUTINE W REFLEX MICROSCOPIC - Abnormal; Notable for the following components:   Color, Urine STRAW (*)    All other components within normal limits  I-STAT CG4 LACTIC ACID, ED - Abnormal; Notable for the following components:   Lactic Acid, Venous 4.95 (*)    All other components within normal limits  I-STAT CG4 LACTIC ACID, ED - Abnormal;  Notable for the following components:   Lactic Acid, Venous  2.10 (*)    All other components within normal limits  LIPASE, BLOOD  TROPONIN I  BASIC METABOLIC PANEL  CBC  LACTIC ACID, PLASMA  LACTIC ACID, PLASMA  PROCALCITONIN  I-STAT TROPONIN, ED    EKG EKG Interpretation  Date/Time:  Sunday January 19 2018 13:51:42 EDT Ventricular Rate:  97 PR Interval:    QRS Duration: 106 QT Interval:  363 QTC Calculation: 462 R Axis:   69 Text Interpretation:  Sinus rhythm Prolonged PR interval Inferolateral infarct, old Confirmed by Davonna Belling [phone]) on 01/19/2018 2:11:42 PM   Radiology Dg Chest 2 View  Result Date: 01/19/2018 CLINICAL DATA:  Weakness, nausea and vomiting and fever. EXAM: CHEST - 2 VIEW COMPARISON:  Chest radiograph 12/20/2017 FINDINGS: Monitoring leads overlie the patient. Normal cardiac and mediastinal contours. No consolidative pulmonary opacities. No pleural effusion or pneumothorax. IMPRESSION: No acute cardiopulmonary process. Electronically Signed   By: Lovey Newcomer M.D.   On: 01/19/2018 14:52   Ct Head Wo Contrast  Result Date: 01/19/2018 CLINICAL DATA:  Pt brought in by wife who reports patient appears dehydrated, dizzy, sleeps a lot more throughout the day. EXAM: CT HEAD WITHOUT CONTRAST TECHNIQUE: Contiguous axial images were obtained from the base of the skull through the vertex without intravenous contrast. COMPARISON:  12/05/2017 FINDINGS: Brain: No evidence of acute infarction, hemorrhage, hydrocephalus, extra-axial collection or mass lesion/mass effect. There is ventricular sulcal enlargement reflecting mild diffuse atrophy. Patchy white matter hypoattenuation is noted consistent with moderate chronic microvascular ischemic change. Vascular: No hyperdense vessel or unexpected calcification. Skull: Normal. Negative for fracture or focal lesion. Sinuses/Orbits: Globes and orbits are unremarkable. Visualized sinuses and mastoid air cells are clear. Other: None. IMPRESSION: 1. No acute intracranial abnormalities. 2.  Atrophy and chronic microvascular ischemic change, stable from the prior study. Electronically Signed   By: Lajean Manes M.D.   On: 01/19/2018 16:32   Ct Abdomen Pelvis W Contrast  Result Date: 01/19/2018 CLINICAL DATA:  Pt brought in by wife who reports patient appears dehydrated, dizzy, sleeps a lot more throughout the day and constipated for over 1 week. EXAM: CT ABDOMEN AND PELVIS WITH CONTRAST TECHNIQUE: Multidetector CT imaging of the abdomen and pelvis was performed using the standard protocol following bolus administration of intravenous contrast. CONTRAST:  166mL ISOVUE-300 IOPAMIDOL (ISOVUE-300) INJECTION 61% COMPARISON:  CT, 09/21/2015 FINDINGS: Lower chest: In the posterior lower lobes there is bronchial wall thickening and peribronchovascular opacities and lung base linear opacities. The linear peribronchovascular opacities are likely atelectasis. Bronchial wall thickening may reflect chronic or acute bronchial inflammation. Hepatobiliary: No focal liver abnormality is seen. No gallstones, gallbladder wall thickening, or biliary dilatation. Pancreas: Unremarkable. No pancreatic ductal dilatation or surrounding inflammatory changes. Spleen: Normal in size without focal abnormality. Adrenals/Urinary Tract: No adrenal masses. Bilateral renal cortical thinning. Symmetric renal enhancement and excretion. 11 mm lower pole left renal cyst. Tiny cortical low-density lesion from the upper pole the right kidney also likely a cyst. No other masses, no stones and no hydronephrosis. Ureters are normal in course and in caliber. Bladder is unremarkable. Stomach/Bowel: Rectum is markedly distended with stool. No rectal wall thickening. There is moderate increased stool noted throughout the colon. No colonic wall thickening or inflammation. Stomach and small bowel are unremarkable. Vascular/Lymphatic: Aortic atherosclerosis. No enlarged abdominal or pelvic lymph nodes. Reproductive: Prostate mildly enlarged,  similar to the prior CT. Other: No abdominal wall hernia or abnormality. No abdominopelvic ascites. Musculoskeletal:  Well-positioned bilateral total hip arthroplasties. No fracture or acute finding. No osteoblastic or osteolytic lesions. IMPRESSION: 1. No acute findings within the abdomen or pelvis. 2. Marked increased stool in the rectum with moderate increased stool throughout the colon. No bowel obstruction or inflammation. 3. Lower lobe bronchial wall thickening with peribronchovascular opacities, right greater than left. This is likely a combination of chronic or acute bronchial inflammation with associated atelectasis. Consider bronchopneumonia in the right lower lobe if there are consistent clinical findings. 4. Aortic atherosclerosis. Electronically Signed   By: Lajean Manes M.D.   On: 01/19/2018 16:29    Procedures Procedures (including critical care time)  Medications Ordered in ED Medications  iopamidol (ISOVUE-300) 61 % injection (has no administration in time range)  sodium chloride 0.9 % injection (has no administration in time range)  omega-3 acid ethyl esters (LOVAZA) capsule 2 g (has no administration in time range)  simvastatin (ZOCOR) tablet 40 mg (has no administration in time range)  buPROPion (WELLBUTRIN) tablet 75 mg (has no administration in time range)  memantine (NAMENDA) tablet 10 mg (has no administration in time range)  insulin glargine (LANTUS) injection 5 Units (has no administration in time range)  meclizine (ANTIVERT) tablet 12.5 mg (has no administration in time range)  pantoprazole (PROTONIX) EC tablet 40 mg (has no administration in time range)  apixaban (ELIQUIS) tablet 5 mg (has no administration in time range)  cholecalciferol (VITAMIN D) tablet 1,000 Units (has no administration in time range)  albuterol (PROVENTIL) (2.5 MG/3ML) 0.083% nebulizer solution 2.5 mg (has no administration in time range)  acetaminophen (TYLENOL) tablet 650 mg (has no  administration in time range)    Or  acetaminophen (TYLENOL) suppository 650 mg (has no administration in time range)  ondansetron (ZOFRAN) tablet 4 mg (has no administration in time range)    Or  ondansetron (ZOFRAN) injection 4 mg (has no administration in time range)  insulin aspart (novoLOG) injection 0-9 Units (has no administration in time range)  0.9 %  sodium chloride infusion (has no administration in time range)  sodium chloride 0.9 % bolus 1,000 mL (0 mLs Intravenous Stopped 01/19/18 1644)  sodium chloride 0.9 % bolus 1,000 mL (0 mLs Intravenous Stopped 01/19/18 1745)  iopamidol (ISOVUE-300) 61 % injection 100 mL (100 mLs Intravenous Contrast Given 01/19/18 1559)     Initial Impression / Assessment and Plan / ED Course  I have reviewed the triage vital signs and the nursing notes.  Pertinent labs & imaging results that were available during my care of the patient were reviewed by me and considered in my medical decision making (see chart for details).    77 year old male who presents for evaluation of dizziness, generalized weakness.  Wife states that at home, every time he would try to walk around, he would get dizzy.  He states that it feels like "I am going to fall down."  No chest pain, difficulty breathing.  Wife has not noticed any vomiting.  She does state that patient is bad about drinking fluids at home.  Initial ED arrival, patient is afebrile but is hypotensive.  Dry mucous brains on physical exam.  Normal neuro exam.  Difficulty assessing orthostatic vital signs secondary to patient's inability to stop.  Additionally, wife reports constipation x1 week.  Normal exam benign.  He does not think he is passing flatus.  Low suspicion for obstruction but will get imaging for further evaluation.  Will plan to check basic labs.  2:14 PM: Patient with elevated lactic  acid. At this time, I feel like this is mostly due to dehydration rather than infectious source. Will rehydrate with  aggressive IVF but no indication for sepsis initiation and antibiotics.   Discussed with Dr. Alvino Chapel after independent evaluation.  Feels like most of symptoms are due to dehydration.  Low suspicion for CVA at this time.  Will get head CT since he is on Eliquis for evaluation of intracranial abnormality.  We will plan to rehydrate.  Patient's EF is 60% so he can handle aggressive fluid hydration.  I-STAT troponin negative.  UA negative for any infectious etiology.  Lipase negative.  CMP shows bicarb of 21, glucose of 228, BUN 25.  He has an anion gap of 15.  CBC with no leukocytosis or anemia.  Head CT negative for any ICH.  Chest x-ray negative for any infectious etiology.  CT abdomen pelvis shows increased stool in rectum.  No evidence of obstruction or inflammation.  There is mention of lower lobe bronchial wall thickening with peribronchovascular opacities right greater than left.  Consider bronchopneumonia in the right lower lobe if there are consistent clinical findings.  At this time, no rales that would concern for infectious process.  Attempted to ambualte  patient in the ED.  Patient has known gait instability which is not abnormal for him.  When I lifted patient from bed, he immediately fell back on the bed and states that he feels like he is too weak to get up.  Wife states that that is abnormal.  Usually he is able to get up and ambulate with the assistance of a cane.  He states that he just feels weak all over and does not feel like he can get up.  Repeat lactic acid is still elevated after 2 L of IV fluids here in ED.  Patient reports the dizziness has improved but he still feels generalized weakness.  Attempted to sit patient up but he continuously falls back down in bed and states that he is too weak.  Given concerns of repeat injury hydration, recommend observation admission for IV fluids and rehydration.  Discussed with Dr. Hal Hope (hospitalist) recommends obtaining MRI.  He will  accept patient for admission.  Final Clinical Impressions(s) / ED Diagnoses   Final diagnoses:  Generalized weakness  Dehydration    ED Discharge Orders    None       Desma Mcgregor 01/19/18 2135    Davonna Belling, MD 01/20/18 [phone]

## 2018-01-19 NOTE — ED Notes (Signed)
Pt transported to MRI at this time 

## 2018-01-19 NOTE — ED Notes (Addendum)
Pt to await for MRI prior to going to floor.

## 2018-01-19 NOTE — ED Triage Notes (Signed)
Pt brought in by wife who reports patient appears dehydrated, dizzy, sleeps a lot more throughout the day and constipated for over 1 week.

## 2018-01-19 NOTE — ED Notes (Signed)
Patient transported to X-ray 

## 2018-01-19 NOTE — ED Notes (Signed)
Pt given urinal at bedside, collection not enough to send for analysis.

## 2018-01-19 NOTE — H&P (Signed)
History and Physical    Ivan Anderson XVQ:[id number] DOB: [birth year] DOA: 01/19/2018  PCP: Colon Branch, MD  Patient coming from: Home  Chief Complaint: Dizziness.  HPI: Ivan Anderson is a 77 y.o. male with history of paroxysmal atrial fibrillation on Eliquis, diabetes mellitus type 2, hyperlipidemia, dementia was brought to the ER because patient has been finding it increasingly difficult to walk due to dizziness.  Patient has chronic gait issues but as per the patient's wife and patient this is different from what he usually had.  Patient states his symptoms started a week ago has progressively worsened to the point he is unable to get out of the bed because of the dizziness.  Denies any fall headache nausea vomiting or diarrhea.  Patient has benign constipation.  Patient states that since today he also noticed that he has been having some difficulty controlling his urine.  Has not had any weakness of the upper or lower extremity except for that he has generalized weakness.  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  Denies any visual symptoms or difficulty swallowing or speaking.  ED Course: In the ER patient had labs done which showed lactic acidosis.  Since he has constipation CT of the abdomen was done which shows large stool burden.  CT head was unremarkable.  On trying to ambulate patient is still finding it difficult to walk and has been admitted for further work-up and an MRI brain has been ordered which is pending.  Patient has passed swallow evaluation.  Review of Systems: As per HPI, rest all negative.   Past Medical History:  Diagnosis Date  . Anxiety   . Atrial fibrillation, new onset ([place]) 03/27/2015  . CAD (coronary artery disease)   . Colon polyp    Cscope at [facility] aprox 2006, repeated 2010 (-), next 5 years  . Dementia ([place])    per family diagnosed Aug-2016  . Diabetes mellitus with neuropathy ([place])   . Diaphragm paralysis    R, per pt   . DJD  (degenerative joint disease)   . Erectile dysfunction   . Eye muscle paralysis    congenital, lateral rectus, left  . Gait disorder   . Heart palpitations   . Hemorrhoids   . Hyperlipidemia   . Microscopic hematuria 2017   seeing urology  . Mild cognitive disorder    Unspecified mild NCD  . Peripheral neuropathy   . Skin cancer    sees derm    Past Surgical History:  Procedure Laterality Date  . ANKLE FUSION Right   . APPENDECTOMY    . CARPAL TUNNEL RELEASE     B, 2000  . CYSTOSCOPY  2017  . HIP SURGERY Right 1964   pins  . HIP SURGERY  2009   RIGHT--s/p hip replacement, s/p revision August 2009 due to dislocation x 3   . HIP SURGERY  04-2012, 2017   R hip revision  . KNEE SURGERY Left 2005  . MEDIAL PARTIAL KNEE REPLACEMENT Right 08-2012  . TOTAL HIP ARTHROPLASTY  08-09-15   [facility]     reports that he quit smoking about 37 years ago. He has never used smokeless tobacco. He reports that he does not drink alcohol or use drugs.  Allergies  Allergen Reactions  . Pseudoephedrine Other (See Comments)    Dx: A. Fib.     Family History  Problem Relation Age of Onset  . Dementia Father   . Emphysema  Father   . Diabetes Maternal Grandmother   . Heart disease Sister   . Stroke Sister   . Heart attack Neg Hx   . Hypertension Neg Hx     Prior to Admission medications   Medication Sig Start Date End Date Taking? Authorizing Provider  acetaminophen (TYLENOL) 500 MG tablet Take 1,000 mg by mouth every 6 (six) hours as needed for moderate pain.    Yes [provider]  albuterol (PROVENTIL HFA;VENTOLIN HFA) 108 (90 Base) MCG/ACT inhaler Inhale 2 puffs into the lungs every 6 (six) hours as needed for wheezing or shortness of breath. 12/20/17  Yes Saguier, Percell Miller, PA-C  apixaban (ELIQUIS) 5 MG TABS tablet Take 1 tablet (5 mg total) by mouth 2 (two) times daily. 11/07/16  Yes Paz, Alda Berthold, MD  buPROPion (WELLBUTRIN) 75 MG tablet Take 1 tablet (75 mg total) by mouth  2 (two) times daily. 05/25/16  Yes Paz, Alda Berthold, MD  cholecalciferol (VITAMIN D) 1000 units tablet Take 1,000 Units by mouth daily.   Yes [provider]  Cinnamon 500 MG capsule Take 1,000 mg by mouth 2 (two) times daily.    Yes [provider]  insulin glargine (LANTUS) 100 UNIT/ML injection Inject 5 Units into the skin at bedtime.   Yes [provider]  Iron-Vitamins (GERITOL COMPLETE PO) Take 1 tablet by mouth daily.   Yes [provider]  meclizine (ANTIVERT) 12.5 MG tablet Take 1 tablet (12.5 mg total) by mouth 2 (two) times daily as needed for dizziness. Patient taking differently: Take 12.5 mg by mouth at bedtime.  03/29/17  Yes Paz, Alda Berthold, MD  memantine (NAMENDA) 10 MG tablet Take 1 tablet (10 mg total) 2 (two) times daily by mouth. 02/12/17  Yes Tat, Eustace Quail, DO  metFORMIN (GLUCOPHAGE) 1000 MG tablet Take 1 tablet (1,000 mg total) by mouth 2 (two) times daily with a meal. 12/02/14  Yes Colon Branch, MD  Multiple Vitamin (MULTIVITAMIN) tablet Take 1 tablet by mouth every morning.  08/27/12  Yes [provider]  omega-3 acid ethyl esters (LOVAZA) 1 g capsule Take 2 g by mouth 2 (two) times daily.    Yes [provider]  omeprazole (PRILOSEC) 20 MG capsule Take 20 mg by mouth daily as needed (heart burn).  12/02/14  Yes Paz, Alda Berthold, MD  polyvinyl alcohol (LIQUIFILM TEARS) 1.4 % ophthalmic solution Place 1 drop into both eyes 3 (three) times daily as needed for dry eyes.   Yes [provider]  simvastatin (ZOCOR) 40 MG tablet Take 40 mg by mouth daily.    Yes [provider]  vitamin C (ASCORBIC ACID) 500 MG tablet Take 500 mg by mouth daily.   Yes [provider]  benzonatate (TESSALON) 100 MG capsule Take 1 capsule (100 mg total) by mouth 3 (three) times daily as needed for cough. Patient not taking: Reported on 01/19/2018 12/20/17   Saguier, Percell Miller, PA-C  doxycycline (VIBRA-TABS) 100 MG tablet Take 1 tablet (100 mg  total) by mouth 2 (two) times daily. Patient not taking: Reported on 01/19/2018 12/20/17   Saguier, Percell Miller, PA-C  fluticasone [facility]) 50 MCG/ACT nasal spray Place 2 sprays into both nostrils daily. Patient not taking: Reported on 01/19/2018 12/20/17   Saguier, Percell Miller, PA-C  tamsulosin (FLOMAX) 0.4 MG CAPS capsule Take 1 capsule (0.4 mg total) by mouth at bedtime. Patient not taking: Reported on 01/19/2018 01/14/17   Colon Branch, MD    Physical Exam: Vitals:  01/19/18 1821 01/19/18 1830 01/19/18 1915 01/19/18 1920  BP: (!) 199/92 (!) 105/91 (!) 142/64   Pulse: (!) 55 (!) 53 71   Resp: (!) 23 (!) 29 (!) 31   Temp:      TempSrc:      SpO2: 99% 99% 99%   Weight:    100.7 kg  Height:    6\' 6"  (1.981 m)      Constitutional: Moderately built and nourished. Vitals:   01/19/18 1821 01/19/18 1830 01/19/18 1915 01/19/18 1920  BP: (!) 199/92 (!) 105/91 (!) 142/64   Pulse: (!) 55 (!) 53 71   Resp: (!) 23 (!) 29 (!) 31   Temp:      TempSrc:      SpO2: 99% 99% 99%   Weight:    100.7 kg  Height:    6\' 6"  (1.981 m)   Eyes: Anicteric no pallor. ENMT: No discharge from the ears eyes nose or mouth. Neck: No mass felt.  No neck rigidity.  No JVD appreciated. Respiratory: No rhonchi or crepitations. Cardiovascular: S1-S2 heard no murmurs appreciated. Abdomen: Soft nontender bowel sounds present. Musculoskeletal: No edema.  No joint effusion. Skin: No rash.  Skin appears warm. Neurologic: Alert awake oriented to time place and person.  Moves all extremities 5 x 5.  Difficult to abduct his left eye.  Pupils equal reacting to light.  No facial asymmetry tongue is midline. Psychiatric: Appears normal.   Labs on Admission: I have personally reviewed following labs and imaging studies  CBC: Recent Labs  Lab 01/19/18 1350  WBC 7.5  NEUTROABS 6.0  HGB 13.0  HCT 39.2  MCV 93.8  PLT 938   Basic Metabolic Panel: Recent Labs  Lab 01/19/18 1350  NA 137  K 4.3  CL 101  CO2 21*    GLUCOSE 228*  BUN 25*  CREATININE 0.80  CALCIUM 8.9   GFR: Estimated Creatinine Clearance: 96.8 mL/min (by C-G formula based on SCr of 0.8 mg/dL). Liver Function Tests: Recent Labs  Lab 01/19/18 1350  AST 23  ALT 22  ALKPHOS 96  BILITOT 0.2*  PROT 6.3*  ALBUMIN 3.6   Recent Labs  Lab 01/19/18 1350  LIPASE 34   No results for input(s): AMMONIA in the last 168 hours. Coagulation Profile: No results for input(s): INR, PROTIME in the last 168 hours. Cardiac Enzymes: Recent Labs  Lab 01/19/18 1350  TROPONINI <0.03   BNP (last 3 results) No results for input(s): PROBNP in the last 8760 hours. HbA1C: No results for input(s): HGBA1C in the last 72 hours. CBG: No results for input(s): GLUCAP in the last 168 hours. Lipid Profile: No results for input(s): CHOL, HDL, LDLCALC, TRIG, CHOLHDL, LDLDIRECT in the last 72 hours. Thyroid Function Tests: No results for input(s): TSH, T4TOTAL, FREET4, T3FREE, THYROIDAB in the last 72 hours. Anemia Panel: No results for input(s): VITAMINB12, FOLATE, FERRITIN, TIBC, IRON, RETICCTPCT in the last 72 hours. Urine analysis:    Component Value Date/Time   COLORURINE STRAW (A) 01/19/2018 1350   APPEARANCEUR CLEAR 01/19/2018 1350   LABSPEC 1.019 01/19/2018 1350   PHURINE 5.0 01/19/2018 1350   GLUCOSEU NEGATIVE 01/19/2018 1350   GLUCOSEU NEGATIVE 12/24/2016 1522   HGBUR NEGATIVE 01/19/2018 1350   BILIRUBINUR NEGATIVE 01/19/2018 1350   KETONESUR NEGATIVE 01/19/2018 1350   PROTEINUR NEGATIVE 01/19/2018 1350   UROBILINOGEN 0.2 12/24/2016 1522   NITRITE NEGATIVE 01/19/2018 1350   LEUKOCYTESUR NEGATIVE 01/19/2018 1350   Sepsis Labs: @LABRCNTIP (procalcitonin:4,lacticidven:4) )No results found for  this or any previous visit (from the past 240 hour(s)).   Radiological Exams on Admission: Dg Chest 2 View  Result Date: 01/19/2018 CLINICAL DATA:  Weakness, nausea and vomiting and fever. EXAM: CHEST - 2 VIEW COMPARISON:  Chest radiograph  12/20/2017 FINDINGS: Monitoring leads overlie the patient. Normal cardiac and mediastinal contours. No consolidative pulmonary opacities. No pleural effusion or pneumothorax. IMPRESSION: No acute cardiopulmonary process. Electronically Signed   By: Lovey Newcomer M.D.   On: 01/19/2018 14:52   Ct Head Wo Contrast  Result Date: 01/19/2018 CLINICAL DATA:  Pt brought in by wife who reports patient appears dehydrated, dizzy, sleeps a lot more throughout the day. EXAM: CT HEAD WITHOUT CONTRAST TECHNIQUE: Contiguous axial images were obtained from the base of the skull through the vertex without intravenous contrast. COMPARISON:  12/05/2017 FINDINGS: Brain: No evidence of acute infarction, hemorrhage, hydrocephalus, extra-axial collection or mass lesion/mass effect. There is ventricular sulcal enlargement reflecting mild diffuse atrophy. Patchy white matter hypoattenuation is noted consistent with moderate chronic microvascular ischemic change. Vascular: No hyperdense vessel or unexpected calcification. Skull: Normal. Negative for fracture or focal lesion. Sinuses/Orbits: Globes and orbits are unremarkable. Visualized sinuses and mastoid air cells are clear. Other: None. IMPRESSION: 1. No acute intracranial abnormalities. 2. Atrophy and chronic microvascular ischemic change, stable from the prior study. Electronically Signed   By: Lajean Manes M.D.   On: 01/19/2018 16:32   Ct Abdomen Pelvis W Contrast  Result Date: 01/19/2018 CLINICAL DATA:  Pt brought in by wife who reports patient appears dehydrated, dizzy, sleeps a lot more throughout the day and constipated for over 1 week. EXAM: CT ABDOMEN AND PELVIS WITH CONTRAST TECHNIQUE: Multidetector CT imaging of the abdomen and pelvis was performed using the standard protocol following bolus administration of intravenous contrast. CONTRAST:  19mL ISOVUE-300 IOPAMIDOL (ISOVUE-300) INJECTION 61% COMPARISON:  CT, 09/21/2015 FINDINGS: Lower chest: In the posterior lower  lobes there is bronchial wall thickening and peribronchovascular opacities and lung base linear opacities. The linear peribronchovascular opacities are likely atelectasis. Bronchial wall thickening may reflect chronic or acute bronchial inflammation. Hepatobiliary: No focal liver abnormality is seen. No gallstones, gallbladder wall thickening, or biliary dilatation. Pancreas: Unremarkable. No pancreatic ductal dilatation or surrounding inflammatory changes. Spleen: Normal in size without focal abnormality. Adrenals/Urinary Tract: No adrenal masses. Bilateral renal cortical thinning. Symmetric renal enhancement and excretion. 11 mm lower pole left renal cyst. Tiny cortical low-density lesion from the upper pole the right kidney also likely a cyst. No other masses, no stones and no hydronephrosis. Ureters are normal in course and in caliber. Bladder is unremarkable. Stomach/Bowel: Rectum is markedly distended with stool. No rectal wall thickening. There is moderate increased stool noted throughout the colon. No colonic wall thickening or inflammation. Stomach and small bowel are unremarkable. Vascular/Lymphatic: Aortic atherosclerosis. No enlarged abdominal or pelvic lymph nodes. Reproductive: Prostate mildly enlarged, similar to the prior CT. Other: No abdominal wall hernia or abnormality. No abdominopelvic ascites. Musculoskeletal: Well-positioned bilateral total hip arthroplasties. No fracture or acute finding. No osteoblastic or osteolytic lesions. IMPRESSION: 1. No acute findings within the abdomen or pelvis. 2. Marked increased stool in the rectum with moderate increased stool throughout the colon. No bowel obstruction or inflammation. 3. Lower lobe bronchial wall thickening with peribronchovascular opacities, right greater than left. This is likely a combination of chronic or acute bronchial inflammation with associated atelectasis. Consider bronchopneumonia in the right lower lobe if there are consistent  clinical findings. 4. Aortic atherosclerosis. Electronically Signed   By: Shanon Brow  Ormond M.D.   On: 01/19/2018 16:29    EKG: Independently reviewed.  Normal sinus rhythm.  Assessment/Plan Principal Problem:   Dizziness Active Problems:   Essential hypertension   Dementia, vascular (HCC)   Paroxysmal atrial fibrillation (HCC)   HLD (hyperlipidemia)   Chronic diastolic CHF (congestive heart failure) (HCC)   Weakness generalized   Ataxia    1. Dizziness -cause not clear will get MRI of the brain following which we will have further plans.  Physical therapy consult.  Will discuss with neurologist about patient's difficulty holding urine. 2. Lactic acidosis with weakness -we will hydrate and closely observe.  Follow lactic acid levels.  No signs of any infection or sepsis. 3. Constipation will give 1 dose of edema.  Check TSH. 4. Diabetes mellitus type 2 we will hold metformin and keep patient on Lantus. 5. Paroxysmal atrial fibrillation on Eliquis.  Has not missed his dose. 6. Hyperlipidemia on statins. 7. Dementia on Namenda. 8. Chronic diastolic CHF as per the chart appears euvolemic and is on IV fluids for now.  CT of the abdomen did show some signs of possible bronchopneumonia.  Patient has no cough or fever or leukocytosis.  Will check procalcitonin levels and closely observe clinically for now.   DVT prophylaxis: Apixaban. Code Status: Full code. Family Communication: Patient's wife. Disposition Plan: Home. Consults called: Physical therapy.  Will discuss with neurologist. Admission status: Observation.   Rise Patience MD [facility] Pager [phone].  If 7PM-7AM, please contact night-coverage [email or web address] Password [email or web address]  01/19/2018, 8:02 PM

## 2018-01-19 NOTE — ED Notes (Signed)
ED TO INPATIENT HANDOFF REPORT  Name/Age/Gender Ivan Anderson 77 y.o. male  Code Status Code Status History    Date Active Date Inactive Code Status Order ID Comments User Context   07/17/2016 0120 07/17/2016 2113 Full Code [id number]  Ivor Costa, MD ED   06/15/2016 2237 06/16/2016 1815 DNR [id number]  Toy Baker, MD Inpatient    Advance Directive Documentation     Most Recent Value  Type of Advance Directive  Living will, Healthcare Power of Attorney  Pre-existing out of facility DNR order (yellow form or pink MOST form)  -  "MOST" Form in Place?  -      Home/SNF/Other Home  Chief Complaint Constipation, walking difficulty  Level of Care/Admitting Diagnosis ED Disposition    ED Disposition Condition [place]: [facility] [100102]  Level of Care: Telemetry [5]  Admit to tele based on following criteria: Monitor for Ischemic changes  Diagnosis: Ataxia [967591]  Admitting Physician: Rise Patience [phone]  Attending Physician: Rise Patience [[id number]]  PT Class (Do Not Modify): Observation [104]  PT Acc Code (Do Not Modify): Observation [10022]       Medical History Past Medical History:  Diagnosis Date  . Anxiety   . Atrial fibrillation, new onset ([place]) 03/27/2015  . CAD (coronary artery disease)   . Colon polyp    Cscope at [facility] aprox 2006, repeated 2010 (-), next 5 years  . Dementia ([place])    per family diagnosed Aug-2016  . Diabetes mellitus with neuropathy ([place])   . Diaphragm paralysis    R, per pt   . DJD (degenerative joint disease)   . Erectile dysfunction   . Eye muscle paralysis    congenital, lateral rectus, left  . Gait disorder   . Heart palpitations   . Hemorrhoids   . Hyperlipidemia   . Microscopic hematuria 2017   seeing urology  . Mild cognitive disorder    Unspecified mild NCD  . Peripheral neuropathy   . Skin cancer    sees derm    Allergies Allergies  Allergen Reactions  .  Pseudoephedrine Other (See Comments)    Dx: A. Fib.     IV Location/Drains/Wounds Patient Lines/Drains/Airways Status   Active Line/Drains/Airways    Name:   Placement date:   Placement time:   Site:   Days:   Peripheral IV 01/19/18 Left Arm   01/19/18    1404    Arm   less than 1   Peripheral IV 01/19/18 Right Arm   01/19/18    1442    Arm   less than 1          Labs/Imaging Results for orders placed or performed during the hospital encounter of 01/19/18 (from the past 48 hour(s))  Comprehensive metabolic panel     Status: Abnormal   Collection Time: 01/19/18  1:50 PM  Result Value Ref Range   Sodium 137 135 - 145 mmol/L   Potassium 4.3 3.5 - 5.1 mmol/L   Chloride 101 98 - 111 mmol/L   CO2 21 (L) 22 - 32 mmol/L   Glucose, Bld 228 (H) 70 - 99 mg/dL   BUN 25 (H) 8 - 23 mg/dL   Creatinine, Ser 0.80 0.61 - 1.24 mg/dL   Calcium 8.9 8.9 - 10.3 mg/dL   Total Protein 6.3 (L) 6.5 - 8.1 g/dL   Albumin 3.6 3.5 - 5.0 g/dL   AST 23 15 - 41 U/L  ALT 22 0 - 44 U/L   Alkaline Phosphatase 96 38 - 126 U/L   Total Bilirubin 0.2 (L) 0.3 - 1.2 mg/dL   GFR calc non Af Amer >60 >60 mL/min   GFR calc Af Amer >60 >60 mL/min    Comment: (NOTE) The eGFR has been calculated using the CKD EPI equation. This calculation has not been validated in all clinical situations. eGFR's persistently <60 mL/min signify possible Chronic Kidney Disease.    Anion gap 15 5 - 15    Comment: Performed at [facility], [place] [address] [place], [place] [address]  Lipase, blood     Status: None   Collection Time: 01/19/18  1:50 PM  Result Value Ref Range   Lipase 34 11 - 51 U/L    Comment: Performed at [facility], [place] [address] [place], [place] [address]  CBC with Differential     Status: Abnormal   Collection Time: 01/19/18  1:50 PM  Result Value Ref Range   WBC 7.5 4.0 - 10.5 K/uL   RBC 4.18 (L) 4.22 - 5.81 MIL/uL   Hemoglobin 13.0 13.0 - 17.0 g/dL   HCT 39.2 39.0 -  52.0 %   MCV 93.8 80.0 - 100.0 fL   MCH 31.1 26.0 - 34.0 pg   MCHC 33.2 30.0 - 36.0 g/dL   RDW 13.1 11.5 - 15.5 %   Platelets 196 150 - 400 K/uL   nRBC 0.0 0.0 - 0.2 %   Neutrophils Relative % 80 %   Neutro Abs 6.0 1.7 - 7.7 K/uL   Lymphocytes Relative 12 %   Lymphs Abs 0.9 0.7 - 4.0 K/uL   Monocytes Relative 6 %   Monocytes Absolute 0.4 0.1 - 1.0 K/uL   Eosinophils Relative 1 %   Eosinophils Absolute 0.1 0.0 - 0.5 K/uL   Basophils Relative 1 %   Basophils Absolute 0.0 0.0 - 0.1 K/uL   Immature Granulocytes 0 %   Abs Immature Granulocytes 0.02 0.00 - 0.07 K/uL    Comment: Performed at [facility], [place] [address] [place], [place] [address]  Urinalysis, Routine w reflex microscopic     Status: Abnormal   Collection Time: 01/19/18  1:50 PM  Result Value Ref Range   Color, Urine STRAW (A) YELLOW   APPearance CLEAR CLEAR   Specific Gravity, Urine 1.019 1.005 - 1.030   pH 5.0 5.0 - 8.0   Glucose, UA NEGATIVE NEGATIVE mg/dL   Hgb urine dipstick NEGATIVE NEGATIVE   Bilirubin Urine NEGATIVE NEGATIVE   Ketones, ur NEGATIVE NEGATIVE mg/dL   Protein, ur NEGATIVE NEGATIVE mg/dL   Nitrite NEGATIVE NEGATIVE   Leukocytes, UA NEGATIVE NEGATIVE    Comment: Performed at [place] [address] [place], [place] [address]  Troponin I     Status: None   Collection Time: 01/19/18  1:50 PM  Result Value Ref Range   Troponin I <0.03 <0.03 ng/mL    Comment: Performed at [facility], [place] [address] [place], [place] [address]  I-stat troponin, ED     Status: None   Collection Time: 01/19/18  2:10 PM  Result Value Ref Range   Troponin i, poc 0.00 0.00 - 0.08 ng/mL   Comment 3            Comment: Due to the release kinetics of cTnI, a negative result within the first hours of the onset of symptoms does not rule out myocardial infarction  with certainty. If myocardial infarction is still suspected, repeat the test at appropriate  intervals.   I-Stat CG4 Lactic Acid, ED     Status: Abnormal   Collection Time: 01/19/18  2:12 PM  Result Value Ref Range   Lactic Acid, Venous 4.95 (HH) 0.5 - 1.9 mmol/L   Comment NOTIFIED PHYSICIAN   I-Stat CG4 Lactic Acid, ED     Status: Abnormal   Collection Time: 01/19/18  6:26 PM  Result Value Ref Range   Lactic Acid, Venous 2.10 (HH) 0.5 - 1.9 mmol/L   Comment NOTIFIED PHYSICIAN    Dg Chest 2 View  Result Date: 01/19/2018 CLINICAL DATA:  Weakness, nausea and vomiting and fever. EXAM: CHEST - 2 VIEW COMPARISON:  Chest radiograph 12/20/2017 FINDINGS: Monitoring leads overlie the patient. Normal cardiac and mediastinal contours. No consolidative pulmonary opacities. No pleural effusion or pneumothorax. IMPRESSION: No acute cardiopulmonary process. Electronically Signed   By: Lovey Newcomer M.D.   On: 01/19/2018 14:52   Ct Head Wo Contrast  Result Date: 01/19/2018 CLINICAL DATA:  Pt brought in by wife who reports patient appears dehydrated, dizzy, sleeps a lot more throughout the day. EXAM: CT HEAD WITHOUT CONTRAST TECHNIQUE: Contiguous axial images were obtained from the base of the skull through the vertex without intravenous contrast. COMPARISON:  12/05/2017 FINDINGS: Brain: No evidence of acute infarction, hemorrhage, hydrocephalus, extra-axial collection or mass lesion/mass effect. There is ventricular sulcal enlargement reflecting mild diffuse atrophy. Patchy white matter hypoattenuation is noted consistent with moderate chronic microvascular ischemic change. Vascular: No hyperdense vessel or unexpected calcification. Skull: Normal. Negative for fracture or focal lesion. Sinuses/Orbits: Globes and orbits are unremarkable. Visualized sinuses and mastoid air cells are clear. Other: None. IMPRESSION: 1. No acute intracranial abnormalities. 2. Atrophy and chronic microvascular ischemic change, stable from the prior study. Electronically Signed   By: Lajean Manes M.D.   On: 01/19/2018 16:32    Ct Abdomen Pelvis W Contrast  Result Date: 01/19/2018 CLINICAL DATA:  Pt brought in by wife who reports patient appears dehydrated, dizzy, sleeps a lot more throughout the day and constipated for over 1 week. EXAM: CT ABDOMEN AND PELVIS WITH CONTRAST TECHNIQUE: Multidetector CT imaging of the abdomen and pelvis was performed using the standard protocol following bolus administration of intravenous contrast. CONTRAST:  153m ISOVUE-300 IOPAMIDOL (ISOVUE-300) INJECTION 61% COMPARISON:  CT, 09/21/2015 FINDINGS: Lower chest: In the posterior lower lobes there is bronchial wall thickening and peribronchovascular opacities and lung base linear opacities. The linear peribronchovascular opacities are likely atelectasis. Bronchial wall thickening may reflect chronic or acute bronchial inflammation. Hepatobiliary: No focal liver abnormality is seen. No gallstones, gallbladder wall thickening, or biliary dilatation. Pancreas: Unremarkable. No pancreatic ductal dilatation or surrounding inflammatory changes. Spleen: Normal in size without focal abnormality. Adrenals/Urinary Tract: No adrenal masses. Bilateral renal cortical thinning. Symmetric renal enhancement and excretion. 11 mm lower pole left renal cyst. Tiny cortical low-density lesion from the upper pole the right kidney also likely a cyst. No other masses, no stones and no hydronephrosis. Ureters are normal in course and in caliber. Bladder is unremarkable. Stomach/Bowel: Rectum is markedly distended with stool. No rectal wall thickening. There is moderate increased stool noted throughout the colon. No colonic wall thickening or inflammation. Stomach and small bowel are unremarkable. Vascular/Lymphatic: Aortic atherosclerosis. No enlarged abdominal or pelvic lymph nodes. Reproductive: Prostate mildly enlarged, similar to the prior CT. Other: No abdominal wall hernia or abnormality. No abdominopelvic ascites. Musculoskeletal: Well-positioned bilateral total hip  arthroplasties. No fracture or  acute finding. No osteoblastic or osteolytic lesions. IMPRESSION: 1. No acute findings within the abdomen or pelvis. 2. Marked increased stool in the rectum with moderate increased stool throughout the colon. No bowel obstruction or inflammation. 3. Lower lobe bronchial wall thickening with peribronchovascular opacities, right greater than left. This is likely a combination of chronic or acute bronchial inflammation with associated atelectasis. Consider bronchopneumonia in the right lower lobe if there are consistent clinical findings. 4. Aortic atherosclerosis. Electronically Signed   By: Lajean Manes M.D.   On: 01/19/2018 16:29   EKG Interpretation  Date/Time:  Sunday January 19 2018 13:51:42 EDT Ventricular Rate:  97 PR Interval:    QRS Duration: 106 QT Interval:  363 QTC Calculation: 462 R Axis:   69 Text Interpretation:  Sinus rhythm Prolonged PR interval Inferolateral infarct, old Confirmed by Davonna Belling [phone]) on 01/19/2018 2:11:42 PM   Pending Labs Unresulted Labs (From admission, onward)   None      Vitals/Pain Today's Vitals   01/19/18 1821 01/19/18 1830 01/19/18 1915 01/19/18 1920  BP: (!) 199/92 (!) 105/91 (!) 142/64   Pulse: (!) 55 (!) 53 71   Resp: (!) 23 (!) 29 (!) 31   Temp:      TempSrc:      SpO2: 99% 99% 99%   Weight:    100.7 kg  Height:    _0  (1.981 m)  PainSc:        Isolation Precautions No active isolations  Medications Medications  iopamidol (ISOVUE-300) 61 % injection (has no administration in time range)  sodium chloride 0.9 % injection (has no administration in time range)  sodium chloride 0.9 % bolus 1,000 mL (0 mLs Intravenous Stopped 01/19/18 1644)  sodium chloride 0.9 % bolus 1,000 mL (1,000 mLs Intravenous New Bag/Given 01/19/18 1644)  iopamidol (ISOVUE-300) 61 % injection 100 mL (100 mLs Intravenous Contrast Given 01/19/18 1559)    Mobility walks with device

## 2018-01-20 ENCOUNTER — Telehealth: Payer: Self-pay

## 2018-01-20 ENCOUNTER — Ambulatory Visit (HOSPITAL_BASED_OUTPATIENT_CLINIC_OR_DEPARTMENT_OTHER): Payer: Medicare Other

## 2018-01-20 DIAGNOSIS — R42 Dizziness and giddiness: Secondary | ICD-10-CM

## 2018-01-20 LAB — CBC
HEMATOCRIT: 39.5 % (ref 39.0–52.0)
Hemoglobin: 12.9 g/dL — ABNORMAL LOW (ref 13.0–17.0)
MCH: 31 pg (ref 26.0–34.0)
MCHC: 32.7 g/dL (ref 30.0–36.0)
MCV: 95 fL (ref 80.0–100.0)
NRBC: 0 % (ref 0.0–0.2)
PLATELETS: 166 10*3/uL (ref 150–400)
RBC: 4.16 MIL/uL — AB (ref 4.22–5.81)
RDW: 13 % (ref 11.5–15.5)
WBC: 5.6 10*3/uL (ref 4.0–10.5)

## 2018-01-20 LAB — URINALYSIS, ROUTINE W REFLEX MICROSCOPIC
Bilirubin Urine: NEGATIVE
Glucose, UA: NEGATIVE mg/dL
HGB URINE DIPSTICK: NEGATIVE
Ketones, ur: NEGATIVE mg/dL
LEUKOCYTES UA: NEGATIVE
Nitrite: NEGATIVE
Protein, ur: NEGATIVE mg/dL
SPECIFIC GRAVITY, URINE: 1.014 (ref 1.005–1.030)
pH: 5 (ref 5.0–8.0)

## 2018-01-20 LAB — BASIC METABOLIC PANEL
ANION GAP: 8 (ref 5–15)
BUN: 16 mg/dL (ref 8–23)
CHLORIDE: 105 mmol/L (ref 98–111)
CO2: 28 mmol/L (ref 22–32)
Calcium: 8.8 mg/dL — ABNORMAL LOW (ref 8.9–10.3)
Creatinine, Ser: 0.63 mg/dL (ref 0.61–1.24)
GFR calc non Af Amer: >60 mL/min (ref >60)
GLUCOSE: 86 mg/dL (ref 70–99)
Potassium: 3.7 mmol/L (ref 3.5–5.1)
Sodium: 141 mmol/L (ref 135–145)

## 2018-01-20 LAB — LACTIC ACID, PLASMA
Lactic Acid, Venous: 0.8 mmol/L (ref 0.5–1.9)
Lactic Acid, Venous: 0.6 mmol/L (ref 0.5–1.9)

## 2018-01-20 LAB — PROCALCITONIN: Procalcitonin: <0.1 ng/mL

## 2018-01-20 LAB — GLUCOSE, CAPILLARY
GLUCOSE-CAPILLARY: 102 mg/dL — AB (ref 70–99)
Glucose-Capillary: 154 mg/dL — ABNORMAL HIGH (ref 70–99)

## 2018-01-20 MED ORDER — BISACODYL 10 MG RE SUPP
10.0000 mg | Freq: Once | RECTAL | Status: AC
  Administered 2018-01-20: 10 mg via RECTAL
  Filled 2018-01-20: qty 1

## 2018-01-20 MED ORDER — FLEET ENEMA 7-19 GM/118ML RE ENEM: 1.0000 | ENEMA | Freq: Once | RECTAL | Status: DC

## 2018-01-20 NOTE — Evaluation (Signed)
Physical Therapy Evaluation Patient Details Name: Ivan Anderson MRN: [id number] DOB: Oct 02, 1940 Today's Date: 01/20/2018   History of Present Illness  77 y.o. male with history of paroxysmal atrial fibrillation on Eliquis, diabetes mellitus type 2, hyperlipidemia, dementia and presented with c/o dizziness.  Clinical Impression  Pt admitted with above diagnosis. Pt currently with functional limitations due to the deficits listed below (see PT Problem List).  Pt will benefit from skilled PT to increase their independence and safety with mobility to allow discharge to the venue listed below.   Pt reports dizziness has improved and no c/o dizziness throughout session.  Pt with smeared BM on bed pad (pt unaware) so assisted to bathroom for pericare.  Pt then ambulated in hallway.  Pt with unsteady gait at times even with RW.  Spouse reports d/c to SNF for a few weeks would be helpful as she feels unable to provide current level of care and feels he would benefit more from rehab.  Recommending SNF at this time for decreased balance, generalized weakness, and implementing safe mobility techniques.  Pt is high fall risk.     Follow Up Recommendations SNF    Equipment Recommendations  None recommended by PT    Recommendations for Other Services       Precautions / Restrictions Precautions Precautions: Fall Restrictions Weight Bearing Restrictions: No      Mobility  Bed Mobility Overal bed mobility: Needs Assistance Bed Mobility: Supine to Sit     Supine to sit: Min guard;HOB elevated     General bed mobility comments: increased time and effort  Transfers Overall transfer level: Needs assistance Equipment used: Rolling walker (2 wheeled) Transfers: Sit to/from Stand Sit to Stand: Min assist         General transfer comment: verbal cues for hand placement, assist from lower surfaces (pt is 6'6)  Ambulation/Gait Ambulation/Gait assistance: Min assist Gait Distance (Feet): 70  Feet Assistive device: Rolling walker (2 wheeled) Gait Pattern/deviations: Step-through pattern;Decreased stride length     General Gait Details: verbal cues for RW positioning, posture, pt with a couple instances of LOB requiring min assist for correction; pt denies any dizziness with mobility during session  Stairs            Wheelchair Mobility    Modified Rankin (Stroke Patients Only)       Balance Overall balance assessment: Needs assistance;History of Falls         Standing balance support: Bilateral upper extremity supported Standing balance-Leahy Scale: Poor                               Pertinent Vitals/Pain Pain Assessment: No/denies pain    Home Living Family/patient expects to be discharged to:: Private residence Living Arrangements: Spouse/significant other Available Help at Discharge: Family Type of Home: Apartment Home Access: Level entry     Home Layout: One level Home Equipment: Environmental consultant - 2 wheels      Prior Function Level of Independence: Independent with assistive device(s)         Comments: spouse reports dementia and poor memory, pt has been using RW more recently however admits to being "stubborn" and not using all the time     Hand Dominance        Extremity/Trunk Assessment        Lower Extremity Assessment Lower Extremity Assessment: Generalized weakness       Communication   Communication: [facility]  Cognition Arousal/Alertness: Awake/alert Behavior During Therapy: WFL for tasks assessed/performed Overall Cognitive Status: History of cognitive impairments - at baseline                                 General Comments: spouse reports dementia, pt unable to state date however orientated to place and person      General Comments      Exercises     Assessment/Plan    PT Assessment Patient needs continued PT services  PT Problem List Decreased strength;Decreased mobility;Decreased activity  tolerance;Decreased balance;Decreased knowledge of use of DME;Decreased safety awareness       PT Treatment Interventions DME instruction;Gait training;Therapeutic exercise;Therapeutic activities;Functional mobility training;Balance training;Patient/family education    PT Goals (Current goals can be found in the Care Plan section)  Acute Rehab PT Goals PT Goal Formulation: With patient/family Time For Goal Achievement: 01/27/18 Potential to Achieve Goals: Good    Frequency Min 3X/week   Barriers to discharge        Co-evaluation               AM-PAC PT "6 Clicks" Daily Activity  Outcome Measure Difficulty turning over in bed (including adjusting bedclothes, sheets and blankets)?: A Little Difficulty moving from lying on back to sitting on the side of the bed? : A Lot Difficulty sitting down on and standing up from a chair with arms (e.g., wheelchair, bedside commode, etc,.)?: Unable Help needed moving to and from a bed to chair (including a wheelchair)?: A Little Help needed walking in hospital room?: A Little Help needed climbing 3-5 steps with a railing? : A Lot 6 Click Score: 14    End of Session Equipment Utilized During Treatment: Gait belt Activity Tolerance: Patient tolerated treatment well Patient left: in bed;with call bell/phone within reach;with family/visitor present Nurse Communication: Mobility status PT Visit Diagnosis: Muscle weakness (generalized) (M62.81);Unsteadiness on feet (R26.81)    Time: 3329-5188 PT Time Calculation (min) (ACUTE ONLY): 23 min   Charges:   PT Evaluation $PT Eval Low Complexity: [place], PT, DPT Acute Rehabilitation Services Office: [phone] Pager: [phone]  Trena Platt 01/20/2018, 3:36 PM

## 2018-01-20 NOTE — Telephone Encounter (Signed)
Called patient left message for return call regarding hospitalization and follow up appointment.

## 2018-01-20 NOTE — Discharge Summary (Addendum)
Physician Discharge Summary  WOODARD PERRELL ZOX:[id number] DOB: 1940/05/18 DOA: 01/19/2018  PCP: Colon Branch, MD  Admit date: 01/19/2018 Discharge date: 01/21/2018 Admitted From: Home Disposition: snf Recommendations for Outpatient Follow-up:  1. Follow up with PCP in 1-2 weeks 2. Please obtain BMP/CBC in one week Home Health none Equipment/Devices none  Discharge Condition none CODE STATUS: Full code Diet recommendation: Cardiac Brief/Interim Summary:77 y.o. male with history of paroxysmal atrial fibrillation on Eliquis, diabetes mellitus type 2, hyperlipidemia, dementia was brought to the ER because patient has been finding it increasingly difficult to walk due to dizziness.  Patient has chronic gait issues but as per the patient's wife and patient this is different from what he usually had.  Patient states his symptoms started a week ago has progressively worsened to the point he is unable to get out of the bed because of the dizziness.  Denies any fall headache nausea vomiting or diarrhea.  Patient has benign constipation.  Patient states that since today he also noticed that he has been having some difficulty controlling his urine.  Has not had any weakness of the upper or lower extremity except for that he has generalized weakness.  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  Denies any visual symptoms or difficulty swallowing or speaking.  ED Course: In the ER patient had labs done which showed lactic acidosis.  Since he has constipation CT of the abdomen was done which shows large stool burden.  CT head was unremarkable.  On trying to ambulate patient is still finding it difficult to walk and has been admitted for further work-up and an MRI brain has been ordered which is pending.  Patient has passed swallow evaluation. Discharge Diagnoses:  Principal Problem:   Dizziness Active Problems:   Essential hypertension   Dementia, vascular (HCC)   Paroxysmal  atrial fibrillation (HCC)   HLD (hyperlipidemia)   Chronic diastolic CHF (congestive heart failure) (HCC)   Weakness generalized   Ataxia  #1 dizziness family reports that he has been having dizziness for some time now which is increasing.  He is hard of hearing.  Work-up done so far includes MRI of the brain CT of the brain which really shows atrophy consistent with his age with no acute changes.  From talking with the patient and family it appears that he gets orthostatic when he stands up from a sitting position.  I do not see orthostatics times done in the ER.  Patient received IV fluids overnight.  I will order a carotid ultrasound to look for any blockage to the last ultrasound was in 2018 with less than 50% blockage.  If that comes back okay and patient will get a physical therapy evaluation depending on the evaluation will discharge him to rehab or home health with PT.  At this time he has no evidence of infection or sepsis or stroke.  However I have seen that he has had UTIs in the past I will obtain a UA and urine culture.  His lactic acid level was 4.95 on admission came down to 0.6 after hydration.  Procalcitonin level was normal. A CT scan of the abdomen did show some signs of bronchopneumonia but patient has no signs of cough fever chills or leukocytosis or elevated procalcitonin for now we will not treat him with antibiotics.CUS No significant blockage.  This was discussed in detail with patient's wife and daughter.  #2 constipation resolved  #3 history of paroxysmal atrial fibrillation continue  Eliquis  #4 type 2 diabetes restart metformin.  #5 dementia on Namenda continue.  Patient lives at home with his wife.  #6 chronic diastolic CHF stable.Marland Kitchen  Discharge Instructions   Allergies as of 01/20/2018      Reactions   Pseudoephedrine Other (See Comments)   Dx: A. Fib.       Medication List    STOP taking these medications   acetaminophen 500 MG tablet Commonly known as:   TYLENOL   benzonatate 100 MG capsule Commonly known as:  TESSALON   doxycycline 100 MG tablet Commonly known as:  VIBRA-TABS   fluticasone 50 MCG/ACT nasal spray Commonly known as:  FLONASE   tamsulosin 0.4 MG Caps capsule Commonly known as:  FLOMAX     TAKE these medications   albuterol 108 (90 Base) MCG/ACT inhaler Commonly known as:  PROVENTIL HFA;VENTOLIN HFA Inhale 2 puffs into the lungs every 6 (six) hours as needed for wheezing or shortness of breath.   apixaban 5 MG Tabs tablet Commonly known as:  ELIQUIS Take 1 tablet (5 mg total) by mouth 2 (two) times daily.   buPROPion 75 MG tablet Commonly known as:  WELLBUTRIN Take 1 tablet (75 mg total) by mouth 2 (two) times daily.   cholecalciferol 1000 units tablet Commonly known as:  VITAMIN D Take 1,000 Units by mouth daily.   Cinnamon 500 MG capsule Take 1,000 mg by mouth 2 (two) times daily.   GERITOL COMPLETE PO Take 1 tablet by mouth daily.   insulin glargine 100 UNIT/ML injection Commonly known as:  LANTUS Inject 5 Units into the skin at bedtime.   meclizine 12.5 MG tablet Commonly known as:  ANTIVERT Take 1 tablet (12.5 mg total) by mouth 2 (two) times daily as needed for dizziness. What changed:  when to take this   memantine 10 MG tablet Commonly known as:  NAMENDA Take 1 tablet (10 mg total) 2 (two) times daily by mouth.   metFORMIN 1000 MG tablet Commonly known as:  GLUCOPHAGE Take 1 tablet (1,000 mg total) by mouth 2 (two) times daily with a meal.   multivitamin tablet Take 1 tablet by mouth every morning.   omega-3 acid ethyl esters 1 g capsule Commonly known as:  LOVAZA Take 2 g by mouth 2 (two) times daily.   omeprazole 20 MG capsule Commonly known as:  PRILOSEC Take 20 mg by mouth daily as needed (heart burn).   polyvinyl alcohol 1.4 % ophthalmic solution Commonly known as:  LIQUIFILM TEARS Place 1 drop into both eyes 3 (three) times daily as needed for dry eyes.   simvastatin 40  MG tablet Commonly known as:  ZOCOR Take 40 mg by mouth daily.   vitamin C 500 MG tablet Commonly known as:  ASCORBIC ACID Take 500 mg by mouth daily.      Follow-up [place], MD Follow up.   Specialty:  Internal Medicine Contact information: [address] [place] [address] [phone]          Allergies  Allergen Reactions  . Pseudoephedrine Other (See Comments)    Dx: A. Fib.     Consultations: none  Procedures/Studies: Dg Chest 2 View  Result Date: 01/19/2018 CLINICAL DATA:  Weakness, nausea and vomiting and fever. EXAM: CHEST - 2 VIEW COMPARISON:  Chest radiograph 12/20/2017 FINDINGS: Monitoring leads overlie the patient. Normal cardiac and mediastinal contours. No consolidative pulmonary opacities. No pleural effusion or pneumothorax. IMPRESSION: No acute  cardiopulmonary process. Electronically Signed   By: Lovey Newcomer M.D.   On: 01/19/2018 14:52   Ct Head Wo Contrast  Result Date: 01/19/2018 CLINICAL DATA:  Pt brought in by wife who reports patient appears dehydrated, dizzy, sleeps a lot more throughout the day. EXAM: CT HEAD WITHOUT CONTRAST TECHNIQUE: Contiguous axial images were obtained from the base of the skull through the vertex without intravenous contrast. COMPARISON:  12/05/2017 FINDINGS: Brain: No evidence of acute infarction, hemorrhage, hydrocephalus, extra-axial collection or mass lesion/mass effect. There is ventricular sulcal enlargement reflecting mild diffuse atrophy. Patchy white matter hypoattenuation is noted consistent with moderate chronic microvascular ischemic change. Vascular: No hyperdense vessel or unexpected calcification. Skull: Normal. Negative for fracture or focal lesion. Sinuses/Orbits: Globes and orbits are unremarkable. Visualized sinuses and mastoid air cells are clear. Other: None. IMPRESSION: 1. No acute intracranial abnormalities. 2. Atrophy and chronic microvascular ischemic change, stable  from the prior study. Electronically Signed   By: Lajean Manes M.D.   On: 01/19/2018 16:32   Mr Brain Wo Contrast  Result Date: 01/19/2018 CLINICAL DATA:  Dizziness and gait instability. EXAM: MRI HEAD WITHOUT CONTRAST TECHNIQUE: Multiplanar, multiecho pulse sequences of the brain and surrounding structures were obtained without intravenous contrast. COMPARISON:  Head CT 01/19/2018 FINDINGS: The examination is degraded by motion. BRAIN: There is no acute infarct, acute hemorrhage, hydrocephalus or extra-axial collection. The midline structures are normal. No midline shift or other mass effect. Diffuse confluent hyperintense T2-weighted signal within the periventricular, deep and juxtacortical white matter, most commonly due to chronic ischemic microangiopathy. Generalized atrophy without lobar predilection. 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 Susceptibility-sensitive sequences show no chronic microhemorrhage or superficial siderosis. VASCULAR: Major intracranial arterial and venous sinus flow voids are normal. SKULL AND UPPER CERVICAL SPINE: Calvarial bone marrow signal is normal. There is no skull base mass. Visualized upper cervical spine and soft tissues are normal. SINUSES/ORBITS: No fluid levels or advanced mucosal thickening. No mastoid or middle ear effusion. The orbits are normal. IMPRESSION: 1. Images degraded by motion artifact. 2. No acute abnormality. 3. 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 Electronically Signed   By: Ulyses Jarred M.D.   On: 01/19/2018 22:18   Ct Abdomen Pelvis W Contrast  Result Date: 01/19/2018 CLINICAL DATA:  Pt brought in by wife who reports patient appears dehydrated, dizzy, sleeps a lot more throughout the day and constipated for over 1 week. EXAM: CT ABDOMEN AND PELVIS WITH CONTRAST TECHNIQUE: Multidetector CT imaging of the abdomen and pelvis was performed using the standard protocol following bolus  administration of intravenous contrast. CONTRAST:  156mL ISOVUE-300 IOPAMIDOL (ISOVUE-300) INJECTION 61% COMPARISON:  CT, 09/21/2015 FINDINGS: Lower chest: In the posterior lower lobes there is bronchial wall thickening and peribronchovascular opacities and lung base linear opacities. The linear peribronchovascular opacities are likely atelectasis. Bronchial wall thickening may reflect chronic or acute bronchial inflammation. Hepatobiliary: No focal liver abnormality is seen. No gallstones, gallbladder wall thickening, or biliary dilatation. Pancreas: Unremarkable. No pancreatic ductal dilatation or surrounding inflammatory changes. Spleen: Normal in size without focal abnormality. Adrenals/Urinary Tract: No adrenal masses. Bilateral renal cortical thinning. Symmetric renal enhancement and excretion. 11 mm lower pole left renal cyst. Tiny cortical low-density lesion from the upper pole the right kidney also likely a cyst. No other masses, no stones and no hydronephrosis. Ureters are normal in course and in caliber. Bladder is unremarkable. Stomach/Bowel: Rectum is markedly distended with stool. No rectal wall thickening. There is  moderate increased stool noted throughout the colon. No colonic wall thickening or inflammation. Stomach and small bowel are unremarkable. Vascular/Lymphatic: Aortic atherosclerosis. No enlarged abdominal or pelvic lymph nodes. Reproductive: Prostate mildly enlarged, similar to the prior CT. Other: No abdominal wall hernia or abnormality. No abdominopelvic ascites. Musculoskeletal: Well-positioned bilateral total hip arthroplasties. No fracture or acute finding. No osteoblastic or osteolytic lesions. IMPRESSION: 1. No acute findings within the abdomen or pelvis. 2. Marked increased stool in the rectum with moderate increased stool throughout the colon. No bowel obstruction or inflammation. 3. Lower lobe bronchial wall thickening with peribronchovascular opacities, right greater than left.  This is likely a combination of chronic or acute bronchial inflammation with associated atelectasis. Consider bronchopneumonia in the right lower lobe if there are consistent clinical findings. 4. Aortic atherosclerosis. Electronically Signed   By: Lajean Manes M.D.   On: 01/19/2018 16:29    (Echo, Carotid, EGD, Colonoscopy, ERCP)    Subjective:   Discharge Exam: Vitals:   01/19/18 2248 01/20/18 0529  BP: (!) 141/75 135/77  Pulse: (!) 54 (!) 56  Resp: 20 16  Temp: (!) 97.4 F (36.3 C) (!) 97.5 F (36.4 C)  SpO2: 99% 99%   Vitals:   01/19/18 1920 01/19/18 2015 01/19/18 2248 01/20/18 0529  BP:  (!) 105/94 (!) 141/75 135/77  Pulse:  65 (!) 54 (!) 56  Resp:  (!) 27 20 16   Temp:   (!) 97.4 F (36.3 C) (!) 97.5 F (36.4 C)  TempSrc:   Oral Oral  SpO2:  99% 99% 99%  Weight: 100.7 kg  104.6 kg   Height: 6\' 6"  (1.981 m)  6\' 6"  (1.981 m)     General: Pt is alert, awake, not in acute distress Cardiovascular: RRR, S1/S2 +, no rubs, no gallops Respiratory: CTA bilaterally, no wheezing, no rhonchi Abdominal: Soft, NT, ND, bowel sounds + Extremities: no edema, no cyanosis    The results of significant diagnostics from this hospitalization (including imaging, microbiology, ancillary and laboratory) are listed below for reference.     Microbiology: No results found for this or any previous visit (from the past 240 hour(s)).   Labs: BNP (last 3 results) No results for input(s): BNP in the last 8760 hours. Basic Metabolic Panel: Recent Labs  Lab 01/19/18 1350 01/20/18 0514  NA 137 141  K 4.3 3.7  CL 101 105  CO2 21* 28  GLUCOSE 228* 86  BUN 25* 16  CREATININE 0.80 0.63  CALCIUM 8.9 8.8*   Liver Function Tests: Recent Labs  Lab 01/19/18 1350  AST 23  ALT 22  ALKPHOS 96  BILITOT 0.2*  PROT 6.3*  ALBUMIN 3.6   Recent Labs  Lab 01/19/18 1350  LIPASE 34   No results for input(s): AMMONIA in the last 168 hours. CBC: Recent Labs  Lab 01/19/18 1350  01/20/18 0514  WBC 7.5 5.6  NEUTROABS 6.0  --   HGB 13.0 12.9*  HCT 39.2 39.5  MCV 93.8 95.0  PLT 196 166   Cardiac Enzymes: Recent Labs  Lab 01/19/18 1350  TROPONINI <0.03   BNP: Invalid input(s): POCBNP CBG: Recent Labs  Lab 01/19/18 2321  GLUCAP 90   D-Dimer No results for input(s): DDIMER in the last 72 hours. Hgb A1c No results for input(s): HGBA1C in the last 72 hours. Lipid Profile No results for input(s): CHOL, HDL, LDLCALC, TRIG, CHOLHDL, LDLDIRECT in the last 72 hours. Thyroid function studies No results for input(s): TSH, T4TOTAL, T3FREE, THYROIDAB in the last  72 hours.  Invalid input(s): FREET3 Anemia work up No results for input(s): VITAMINB12, FOLATE, FERRITIN, TIBC, IRON, RETICCTPCT in the last 72 hours. Urinalysis    Component Value Date/Time   COLORURINE YELLOW 01/20/2018 0844   APPEARANCEUR CLEAR 01/20/2018 0844   LABSPEC 1.014 01/20/2018 0844   PHURINE 5.0 01/20/2018 0844   GLUCOSEU NEGATIVE 01/20/2018 0844   GLUCOSEU NEGATIVE 12/24/2016 1522   HGBUR NEGATIVE 01/20/2018 0844   BILIRUBINUR NEGATIVE 01/20/2018 0844   KETONESUR NEGATIVE 01/20/2018 0844   PROTEINUR NEGATIVE 01/20/2018 0844   UROBILINOGEN 0.2 12/24/2016 1522   NITRITE NEGATIVE 01/20/2018 0844   LEUKOCYTESUR NEGATIVE 01/20/2018 0844   Sepsis Labs Invalid input(s): PROCALCITONIN,  WBC,  LACTICIDVEN Microbiology No results found for this or any previous visit (from the past 240 hour(s)).   Time coordinating discharge:  34 minutes  SIGNED:   Georgette Shell, MD  [facility] 01/20/2018, 10:29 AM Pager   If 7PM-7AM, please contact night-coverage [email or web address] Password [email or web address]

## 2018-01-20 NOTE — Care Management Obs Status (Signed)
[facility] NOTIFICATION   Patient Details  Name: Ivan Anderson MRN: [id number] Date of Birth: [birth year]   Medicare Observation Status Notification Given:  Yes    Purcell Mouton, RN 01/20/2018, 12:49 PM

## 2018-01-20 NOTE — Plan of Care (Signed)

## 2018-01-20 NOTE — Progress Notes (Signed)
*  PRELIMINARY RESULTS* Vascular Ultrasound Carotid Duplex (Doppler) has been completed.  Findings suggest 1-39% internal carotid artery stenosis bilaterally. Vertebral arteries are patent with antegrade flow.  01/20/2018 10:47 AM Maudry Mayhew, MHA, RVT, RDCS, RDMS

## 2018-01-21 ENCOUNTER — Telehealth: Payer: Self-pay

## 2018-01-21 LAB — GLUCOSE, CAPILLARY
GLUCOSE-CAPILLARY: 221 mg/dL — AB (ref 70–99)
GLUCOSE-CAPILLARY: 125 mg/dL — AB (ref 70–99)
Glucose-Capillary: 88 mg/dL (ref 70–99)
Glucose-Capillary: 157 mg/dL — ABNORMAL HIGH (ref 70–99)
Glucose-Capillary: 184 mg/dL — ABNORMAL HIGH (ref 70–99)

## 2018-01-21 LAB — URINE CULTURE

## 2018-01-21 NOTE — Progress Notes (Signed)
PROGRESS NOTE    Ivan Anderson  BDZ:[id number] DOB: [birth year] DOA: 01/19/2018 PCP: Colon Branch, MD  Brief Narrative:77 y.o.malewithhistory of paroxysmal atrial fibrillation on Eliquis, diabetes mellitus type 2, hyperlipidemia, dementia was brought to the ER because patient has been finding it increasingly difficult to walk due to dizziness. Patient has chronic gait issues but as per the patient's wife and patient this is different from what he usually had. Patient states his symptoms started a week ago has progressively worsened to the point he is unable to get out of the bed because of the dizziness. Denies any fall headache nausea vomiting or diarrhea. Patient has benign constipation. Patient states that since today he also noticed that he has been having some difficulty controlling his urine. Has not had any weakness of the upper or lower extremityexcept for that he has generalized weakness. 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 Denies any visual symptoms or difficulty swallowing or speaking.  ED Course:In the ER patient had labs done which showed lactic acidosis. Since he has constipation CT of the abdomen was done which shows large stool burden. CT head was unremarkable. On trying to ambulate patient is still finding it difficult to walk and has been admitted for further work-up and an MRI brain has been ordered which is pending. Patient has passed swallow evaluation.  Assessment & Plan:   Principal Problem:   Dizziness Active Problems:   Essential hypertension   Dementia, vascular (HCC)   Paroxysmal atrial fibrillation (HCC)   HLD (hyperlipidemia)   Chronic diastolic CHF (congestive heart failure) (HCC)   Weakness generalized   Ataxia   #1 dizziness family reports that he has been having dizziness for some time now which is increasing.  He is hard of hearing.  Work-up done so far includes MRI of the brain CT of the brain which really  shows atrophy consistent with his age with no acute changes.  From talking with the patient and family it appears that he gets orthostatic when he stands up from a sitting position.  I do not see orthostatics times done in the ER.  Patient received IV fluids overnight.  I will order a carotid ultrasound to look for any blockage to the last ultrasound was in 2018 with less than 50% blockage.  If that comes back okay and patient will get a physical therapy evaluation depending on the evaluation will discharge him to rehab or home health with PT.  At this time he has no evidence of infection or sepsis or stroke.  However I have seen that he has had UTIs in the past I will obtain a UA and urine culture.  His lactic acid level was 4.95 on admission came down to 0.6 after hydration.  Procalcitonin level was normal. A CT scan of the abdomen did show some signs of bronchopneumonia but patient has no signs of cough fever chills or leukocytosis or elevated procalcitonin for now we will not treat him with antibiotics.CUS No significant blockage.  This was discussed in detail with patient's wife and daughter.  DVT prophylaxis eliquis Code Status full Family Communication: dw daughter and wife Disposition Plan: tbd  Consultants: none  Procedures: none Antimicrobials:none  Subjective:resting in bed complains of constipation  Objective: Vitals:   01/20/18 0529 01/20/18 1601 01/20/18 2020 01/21/18 0411  BP: 135/77 (!) 151/68 (!) 156/94 (!) 157/77  Pulse: (!) 56 (!) 57 (!) 57 (!) 52  Resp: 16 16 20 18   Temp: Marland Kitchen)  97.5 F (36.4 C) 98.3 F (36.8 C) 98.5 F (36.9 C) 98.2 F (36.8 C)  TempSrc: Oral Oral Oral Oral  SpO2: 99% 97% 97% 96%  Weight:      Height:        Intake/Output Summary (Last 24 hours) at 01/21/2018 0902 Last data filed at 01/21/2018 0437 Gross per 24 hour  Intake 1029.77 ml  Output 2125 ml  Net -1095.23 ml   Filed Weights   01/19/18 1920 01/19/18 2248  Weight: 100.7 kg 104.6 kg     Examination:  General exam: Appears calm and comfortable  Respiratory system: Clear to auscultation. Respiratory effort normal. Cardiovascular system: S1 & S2 heard, RRR. No JVD, murmurs, rubs, gallops or clicks. No pedal edema. Gastrointestinal system: Abdomen is nondistended, soft and nontender. No organomegaly or masses felt. Normal bowel sounds heard. Central nervous system: Alert and oriented. No focal neurological deficits. Extremities: Symmetric 5 x 5 power. Skin: No rashes, lesions or ulcers Psychiatry: Judgement and insight appear normal. Mood & affect appropriate.     Data Reviewed: I have personally reviewed following labs and imaging studies  CBC: Recent Labs  Lab 01/19/18 1350 01/20/18 0514  WBC 7.5 5.6  NEUTROABS 6.0  --   HGB 13.0 12.9*  HCT 39.2 39.5  MCV 93.8 95.0  PLT 196 008   Basic Metabolic Panel: Recent Labs  Lab 01/19/18 1350 01/20/18 0514  NA 137 141  K 4.3 3.7  CL 101 105  CO2 21* 28  GLUCOSE 228* 86  BUN 25* 16  CREATININE 0.80 0.63  CALCIUM 8.9 8.8*   GFR: Estimated Creatinine Clearance: 96.8 mL/min (by C-G formula based on SCr of 0.63 mg/dL). Liver Function Tests: Recent Labs  Lab 01/19/18 1350  AST 23  ALT 22  ALKPHOS 96  BILITOT 0.2*  PROT 6.3*  ALBUMIN 3.6   Recent Labs  Lab 01/19/18 1350  LIPASE 34   No results for input(s): AMMONIA in the last 168 hours. Coagulation Profile: No results for input(s): INR, PROTIME in the last 168 hours. Cardiac Enzymes: Recent Labs  Lab 01/19/18 1350  TROPONINI <0.03   BNP (last 3 results) No results for input(s): PROBNP in the last 8760 hours. HbA1C: No results for input(s): HGBA1C in the last 72 hours. CBG: Recent Labs  Lab 01/19/18 2321 01/20/18 1604 01/20/18 2233 01/21/18 0808  GLUCAP 90 154* 102* 88   Lipid Profile: No results for input(s): CHOL, HDL, LDLCALC, TRIG, CHOLHDL, LDLDIRECT in the last 72 hours. Thyroid Function Tests: No results for input(s): TSH,  T4TOTAL, FREET4, T3FREE, THYROIDAB in the last 72 hours. Anemia Panel: No results for input(s): VITAMINB12, FOLATE, FERRITIN, TIBC, IRON, RETICCTPCT in the last 72 hours. Sepsis Labs: Recent Labs  Lab 01/19/18 1412 01/19/18 1826 01/20/18 0126 01/20/18 0514  PROCALCITON  --   --  <0.10  --   LATICACIDVEN 4.95* 2.10* 0.8 0.6    Recent Results (from the past 240 hour(s))  Culture, Urine     Status: Abnormal   Collection Time: 01/20/18  8:44 AM  Result Value Ref Range Status   Specimen Description   Final    URINE, CLEAN CATCH Performed at [facility], [place] [address], [place], [place] [address]    Special Requests   Final    NONE Performed at [facility], [place] [address] [place], [place] [address]    Culture (A)  Final    <10,000 COLONIES/mL INSIGNIFICANT GROWTH Performed at [facility], [address] [place], [place] [address]    Report Status 01/21/2018 FINAL  Final         Radiology Studies: Dg Chest 2 View  Result Date: 01/19/2018 CLINICAL DATA:  Weakness, nausea and vomiting and fever. EXAM: CHEST - 2 VIEW COMPARISON:  Chest radiograph 12/20/2017 FINDINGS: Monitoring leads overlie the patient. Normal cardiac and mediastinal contours. No consolidative pulmonary opacities. No pleural effusion or pneumothorax. IMPRESSION: No acute cardiopulmonary process. Electronically Signed   By: Lovey Newcomer M.D.   On: 01/19/2018 14:52   Ct Head Wo Contrast  Result Date: 01/19/2018 CLINICAL DATA:  Pt brought in by wife who reports patient appears dehydrated, dizzy, sleeps a lot more throughout the day. EXAM: CT HEAD WITHOUT CONTRAST TECHNIQUE: Contiguous axial images were obtained from the base of the skull through the vertex without intravenous contrast. COMPARISON:  12/05/2017 FINDINGS: Brain: No evidence of acute infarction, hemorrhage, hydrocephalus, extra-axial collection or mass lesion/mass effect. There is ventricular sulcal  enlargement reflecting mild diffuse atrophy. Patchy white matter hypoattenuation is noted consistent with moderate chronic microvascular ischemic change. Vascular: No hyperdense vessel or unexpected calcification. Skull: Normal. Negative for fracture or focal lesion. Sinuses/Orbits: Globes and orbits are unremarkable. Visualized sinuses and mastoid air cells are clear. Other: None. IMPRESSION: 1. No acute intracranial abnormalities. 2. Atrophy and chronic microvascular ischemic change, stable from the prior study. Electronically Signed   By: Lajean Manes M.D.   On: 01/19/2018 16:32   Mr Brain Wo Contrast  Result Date: 01/19/2018 CLINICAL DATA:  Dizziness and gait instability. EXAM: MRI HEAD WITHOUT CONTRAST TECHNIQUE: Multiplanar, multiecho pulse sequences of the brain and surrounding structures were obtained without intravenous contrast. COMPARISON:  Head CT 01/19/2018 FINDINGS: The examination is degraded by motion. BRAIN: There is no acute infarct, acute hemorrhage, hydrocephalus or extra-axial collection. The midline structures are normal. No midline shift or other mass effect. Diffuse confluent hyperintense T2-weighted signal within the periventricular, deep and juxtacortical white matter, most commonly due to chronic ischemic microangiopathy. Generalized atrophy without lobar predilection. 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 Susceptibility-sensitive sequences show no chronic microhemorrhage or superficial siderosis. VASCULAR: Major intracranial arterial and venous sinus flow voids are normal. SKULL AND UPPER CERVICAL SPINE: Calvarial bone marrow signal is normal. There is no skull base mass. Visualized upper cervical spine and soft tissues are normal. SINUSES/ORBITS: No fluid levels or advanced mucosal thickening. No mastoid or middle ear effusion. The orbits are normal. IMPRESSION: 1. Images degraded by motion artifact. 2. No acute abnormality. 3. Severe chronic white matter  disease, most commonly due to ischemic microangiopathy. Electronically Signed   By: Ulyses Jarred M.D.   On: 01/19/2018 22:18   Ct Abdomen Pelvis W Contrast  Result Date: 01/19/2018 CLINICAL DATA:  Pt brought in by wife who reports patient appears dehydrated, dizzy, sleeps a lot more throughout the day and constipated for over 1 week. EXAM: CT ABDOMEN AND PELVIS WITH CONTRAST TECHNIQUE: Multidetector CT imaging of the abdomen and pelvis was performed using the standard protocol following bolus administration of intravenous contrast. CONTRAST:  178mL ISOVUE-300 IOPAMIDOL (ISOVUE-300) INJECTION 61% COMPARISON:  CT, 09/21/2015 FINDINGS: Lower chest: In the posterior lower lobes there is bronchial wall thickening and peribronchovascular opacities and lung base linear opacities. The linear peribronchovascular opacities are likely atelectasis. Bronchial wall thickening may reflect chronic or acute bronchial inflammation. Hepatobiliary: No focal liver abnormality is seen. No gallstones, gallbladder wall thickening, or biliary dilatation. Pancreas: Unremarkable. No pancreatic ductal dilatation or surrounding inflammatory changes.  Spleen: Normal in size without focal abnormality. Adrenals/Urinary Tract: No adrenal masses. Bilateral renal cortical thinning. Symmetric renal enhancement and excretion. 11 mm lower pole left renal cyst. Tiny cortical low-density lesion from the upper pole the right kidney also likely a cyst. No other masses, no stones and no hydronephrosis. Ureters are normal in course and in caliber. Bladder is unremarkable. Stomach/Bowel: Rectum is markedly distended with stool. No rectal wall thickening. There is moderate increased stool noted throughout the colon. No colonic wall thickening or inflammation. Stomach and small bowel are unremarkable. Vascular/Lymphatic: Aortic atherosclerosis. No enlarged abdominal or pelvic lymph nodes. Reproductive: Prostate mildly enlarged, similar to the prior CT.  Other: No abdominal wall hernia or abnormality. No abdominopelvic ascites. Musculoskeletal: Well-positioned bilateral total hip arthroplasties. No fracture or acute finding. No osteoblastic or osteolytic lesions. IMPRESSION: 1. No acute findings within the abdomen or pelvis. 2. Marked increased stool in the rectum with moderate increased stool throughout the colon. No bowel obstruction or inflammation. 3. Lower lobe bronchial wall thickening with peribronchovascular opacities, right greater than left. This is likely a combination of chronic or acute bronchial inflammation with associated atelectasis. Consider bronchopneumonia in the right lower lobe if there are consistent clinical findings. 4. Aortic atherosclerosis. Electronically Signed   By: Lajean Manes M.D.   On: 01/19/2018 16:29        Scheduled Meds: . apixaban  5 mg Oral BID  . buPROPion  75 mg Oral BID  . cholecalciferol  1,000 Units Oral Daily  . insulin aspart  0-9 Units Subcutaneous TID WC  . insulin glargine  5 Units Subcutaneous QHS  . meclizine  12.5 mg Oral QHS  . memantine  10 mg Oral BID  . omega-3 acid ethyl esters  2 g Oral BID  . pantoprazole  40 mg Oral Daily  . simvastatin  40 mg Oral Daily  . sodium phosphate  1 enema Rectal Once   Continuous Infusions:   LOS: 0 days     Georgette Shell, MD [facility] If 7PM-7AM, please contact night-coverage [email or web address] Password [email or web address] 01/21/2018, 9:02 AM

## 2018-01-21 NOTE — Plan of Care (Signed)

## 2018-01-21 NOTE — NC FL2 (Signed)
[place] LEVEL OF CARE SCREENING TOOL     IDENTIFICATION  Patient Name: Ivan Anderson Birthdate: 10-24-1940 Sex: male Admission Date (Current Location): 01/19/2018  [facility] and [place] Number:  Herbalist and Address:  [facility],  [place], [place]      Provider Number: [id number]  Attending Physician Name and Address:  Georgette Shell, MD  Relative Name and Phone Number:       Current Level of Care: Hospital Recommended Level of Care: [place] Prior Approval Number:    Date Approved/Denied:   PASRR Number:  [id number]  Discharge Plan: SNF    Current Diagnoses: Patient Active Problem List   Diagnosis Date Noted  . Ataxia 01/19/2018  . Dizziness 01/19/2018  . Weakness generalized 12/05/2017  . 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 Lower urinary tract symptoms (LUTS) 02/14/2017  . HLD (hyperlipidemia) 07/17/2016  . GERD (gastroesophageal reflux disease) 07/17/2016  . Depression 07/17/2016  . Chronic diastolic CHF (congestive heart failure) ([place]) 07/17/2016  . Bradycardia 06/15/2016  . PCP NOTES >>>>>> 03/29/2015  . Paroxysmal atrial fibrillation ([place]) 03/27/2015  . 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 Dementia, vascular ([place]) 12/23/2013  . TRIGGER FINGER, LEFT MIDDLE 06/08/2010  . HAND PAIN, LEFT 06/08/2010  . ANXIETY 12/19/2006  . 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 Dyslipidemia 10/01/2006  . Diabetes ([place]) 05/04/2006  . ERECTILE DYSFUNCTION 05/04/2006  . Essential hypertension 05/04/2006  . Coronary atherosclerosis 05/04/2006  . Osteoarthritis 05/04/2006  . COLONIC POLYPS, HX OF 05/04/2006    Orientation RESPIRATION BLADDER Height & Weight     Self, Place  Normal Continent Weight: 230 lb 9.6 oz (104.6 kg) Height:  6\' 6"  (198.1 cm)  BEHAVIORAL SYMPTOMS/MOOD NEUROLOGICAL BOWEL NUTRITION STATUS      Continent Diet(Heart Healthy Diet)  AMBULATORY  STATUS COMMUNICATION OF NEEDS Skin   Limited Assist Verbally Normal                       Personal Care Assistance Level of Assistance  Bathing, Feeding, Dressing Bathing Assistance: Limited assistance Feeding assistance: Independent Dressing Assistance: Limited assistance     Functional Limitations Info             SPECIAL CARE FACTORS FREQUENCY  PT (By licensed PT), OT (By licensed OT)     PT Frequency: 5 OT Frequency: 5            Contractures      Additional Factors Info  Allergies, Code Status Code Status Info: Full Allergies Info: Pseudoephedrine           Current Medications (01/21/2018):  This is the current hospital active medication list Current Facility-Administered Medications  Medication Dose Route Frequency Provider Last Rate Last Dose  . acetaminophen (TYLENOL) tablet 650 mg  650 mg Oral Q6H PRN Rise Patience, MD       Or  . acetaminophen (TYLENOL) suppository 650 mg  650 mg Rectal Q6H PRN Rise Patience, MD      . albuterol (PROVENTIL) (2.5 MG/3ML) 0.083% nebulizer solution 2.5 mg  2.5 mg Inhalation Q6H PRN Rise Patience, MD      . apixaban Arne [facility]) tablet 5 mg  5 mg Oral BID Rise Patience, MD   5 mg at 01/21/18 1027  . buPROPion [facility]) tablet 75 mg  75 mg Oral BID Rise Patience, MD   75 mg at 01/21/18 1027  .  cholecalciferol (VITAMIN D) tablet 1,000 Units  1,000 Units Oral Daily Rise Patience, MD   1,000 Units at 01/21/18 1027  . insulin aspart (novoLOG) injection 0-9 Units  0-9 Units Subcutaneous TID WC Rise Patience, MD   2 Units at 01/20/18 1722  . insulin glargine (LANTUS) injection 5 Units  5 Units Subcutaneous QHS Rise Patience, MD   5 Units at 01/20/18 2235  . meclizine (ANTIVERT) tablet 12.5 mg  12.5 mg Oral QHS Rise Patience, MD   12.5 mg at 01/20/18 2234  . memantine (NAMENDA) tablet 10 mg  10 mg Oral BID Rise Patience, MD   10 mg at 01/21/18 1027  . omega-3  acid ethyl esters (LOVAZA) capsule 2 g  2 g Oral BID Rise Patience, MD   2 g at 01/21/18 1027  . ondansetron (ZOFRAN) tablet 4 mg  4 mg Oral Q6H PRN Rise Patience, MD       Or  . ondansetron [facility]) injection 4 mg  4 mg Intravenous Q6H PRN Rise Patience, MD      . pantoprazole (PROTONIX) EC tablet 40 mg  40 mg Oral Daily Rise Patience, MD   40 mg at 01/21/18 1027  . simvastatin (ZOCOR) tablet 40 mg  40 mg Oral Daily Rise Patience, MD   40 mg at 01/21/18 1027  . sodium phosphate (FLEET) 7-19 GM/118ML enema 1 enema  1 enema Rectal Once Georgette Shell, MD         Discharge Medications: Please see discharge summary for a list of discharge medications.  Relevant Imaging Results:  Relevant Lab Results:   Additional Information  SSN: [id number]  Ollen Barges, LCSWA

## 2018-01-21 NOTE — Progress Notes (Addendum)
2:15PM: patient and family have selected [facility]- facility has started authorization process. Once authorization is received patient will be able to go to facility.   CSW spoke with patients spouse, Orbie Hurst, via bedside- spouse and patient agreeable to placement at this time. Patient and spouse currently live in the [place] area and would prefer a facility close to home. Spouse has also had friends who have had good experiences at [facility] and would be agreeable to their facility. CSW faxed out needed information to facilities in [place] and [place]. CSW will notify patient and spouse once patient has bed offers.   Patient will need prior authorization for [facility] before discharge.   Kingsley Spittle, [place]  [phone]

## 2018-01-21 NOTE — Progress Notes (Signed)
Nutrition Brief Note  Patient identified on the Malnutrition Screening Tool (MST) Report  Wt Readings from Last 15 Encounters:  01/19/18 104.6 kg  12/20/17 100.1 kg  12/05/17 104.3 kg  11/07/17 99.8 kg  10/11/17 103 kg  08/12/17 103 kg  08/07/17 102.6 kg  06/10/17 103.4 kg  05/28/17 95.3 kg  03/31/17 104.8 kg  02/14/17 110.2 kg  02/12/17 109.8 kg  01/14/17 105.7 kg  12/24/16 105.3 kg  10/09/16 105.7 kg   Patient with PMH significant for atrial fibrillation on Eliquis, DM, HLD, and dementia. Presents this admission with complaints of increasingly difficulty walking due to dizziness.   Pt with discharge summary in place. No interventions warranted at this time.   Mariana Single RD, LDN Clinical Nutrition Pager # [phone]

## 2018-01-22 DIAGNOSIS — R279 Unspecified lack of coordination: Secondary | ICD-10-CM | POA: Diagnosis not present

## 2018-01-22 DIAGNOSIS — R278 Other lack of coordination: Secondary | ICD-10-CM | POA: Diagnosis not present

## 2018-01-22 DIAGNOSIS — K5900 Constipation, unspecified: Secondary | ICD-10-CM | POA: Diagnosis not present

## 2018-01-22 DIAGNOSIS — Z794 Long term (current) use of insulin: Secondary | ICD-10-CM | POA: Diagnosis not present

## 2018-01-22 DIAGNOSIS — Z743 Need for continuous supervision: Secondary | ICD-10-CM | POA: Diagnosis not present

## 2018-01-22 DIAGNOSIS — R42 Dizziness and giddiness: Secondary | ICD-10-CM | POA: Diagnosis not present

## 2018-01-22 DIAGNOSIS — R531 Weakness: Secondary | ICD-10-CM

## 2018-01-22 DIAGNOSIS — R270 Ataxia, unspecified: Secondary | ICD-10-CM | POA: Diagnosis not present

## 2018-01-22 DIAGNOSIS — I480 Paroxysmal atrial fibrillation: Secondary | ICD-10-CM | POA: Diagnosis not present

## 2018-01-22 DIAGNOSIS — I2510 Atherosclerotic heart disease of native coronary artery without angina pectoris: Secondary | ICD-10-CM | POA: Diagnosis not present

## 2018-01-22 DIAGNOSIS — R1311 Dysphagia, oral phase: Secondary | ICD-10-CM | POA: Diagnosis not present

## 2018-01-22 DIAGNOSIS — E785 Hyperlipidemia, unspecified: Secondary | ICD-10-CM | POA: Diagnosis not present

## 2018-01-22 DIAGNOSIS — I700 Atherosclerosis of aorta: Secondary | ICD-10-CM | POA: Diagnosis not present

## 2018-01-22 DIAGNOSIS — N390 Urinary tract infection, site not specified: Secondary | ICD-10-CM | POA: Diagnosis not present

## 2018-01-22 DIAGNOSIS — E1140 Type 2 diabetes mellitus with diabetic neuropathy, unspecified: Secondary | ICD-10-CM | POA: Diagnosis not present

## 2018-01-22 DIAGNOSIS — I5032 Chronic diastolic (congestive) heart failure: Secondary | ICD-10-CM | POA: Diagnosis not present

## 2018-01-22 DIAGNOSIS — M6281 Muscle weakness (generalized): Secondary | ICD-10-CM | POA: Diagnosis not present

## 2018-01-22 DIAGNOSIS — Z85828 Personal history of other malignant neoplasm of skin: Secondary | ICD-10-CM | POA: Diagnosis not present

## 2018-01-22 DIAGNOSIS — I10 Essential (primary) hypertension: Secondary | ICD-10-CM | POA: Diagnosis not present

## 2018-01-22 DIAGNOSIS — Z7901 Long term (current) use of anticoagulants: Secondary | ICD-10-CM | POA: Diagnosis not present

## 2018-01-22 DIAGNOSIS — I110 Hypertensive heart disease with heart failure: Secondary | ICD-10-CM | POA: Diagnosis not present

## 2018-01-22 DIAGNOSIS — E860 Dehydration: Secondary | ICD-10-CM

## 2018-01-22 DIAGNOSIS — E119 Type 2 diabetes mellitus without complications: Secondary | ICD-10-CM | POA: Diagnosis not present

## 2018-01-22 DIAGNOSIS — Z79899 Other long term (current) drug therapy: Secondary | ICD-10-CM | POA: Diagnosis not present

## 2018-01-22 DIAGNOSIS — K219 Gastro-esophageal reflux disease without esophagitis: Secondary | ICD-10-CM | POA: Diagnosis not present

## 2018-01-22 DIAGNOSIS — Z87891 Personal history of nicotine dependence: Secondary | ICD-10-CM | POA: Diagnosis not present

## 2018-01-22 DIAGNOSIS — R0902 Hypoxemia: Secondary | ICD-10-CM | POA: Diagnosis not present

## 2018-01-22 DIAGNOSIS — R300 Dysuria: Secondary | ICD-10-CM | POA: Diagnosis not present

## 2018-01-22 LAB — GLUCOSE, CAPILLARY
GLUCOSE-CAPILLARY: 212 mg/dL — AB (ref 70–99)
GLUCOSE-CAPILLARY: 112 mg/dL — AB (ref 70–99)
Glucose-Capillary: 111 mg/dL — ABNORMAL HIGH (ref 70–99)

## 2018-01-22 NOTE — Clinical Social Work Placement (Signed)
Pt admitting to [facility] room 506A. Report# [phone]. Facility received approved [facility] Medicare authorization.  [facility] transportation arranged. Wife at bedside agreeable.    CLINICAL SOCIAL WORK PLACEMENT  NOTE  Date:  01/22/2018  Patient Details  Name: Ivan Anderson MRN: [id number] Date of Birth: 03-Jan-1941  Clinical Social Work is seeking post-discharge placement for this patient at the [place] level of care (*CSW will initial, date and re-position this form in  chart as items are completed):  Yes   Patient/family provided with [facility] Work Department's list of facilities offering this level of care within the geographic area requested by the patient (or if unable, by the patient's family).  Yes   Patient/family informed of their freedom to choose among providers that offer the needed level of care, that participate in Medicare, Medicaid or managed care program needed by the patient, have an available bed and are willing to accept the patient.  Yes   Patient/family informed of [facility]'s ownership interest in [facility] and [facility], as well as of the fact that they are under no obligation to receive care at these facilities.  PASRR submitted to EDS on 01/21/18     PASRR number received on 01/21/18     Existing PASRR number confirmed on       FL2 transmitted to all facilities in geographic area requested by pt/family on 01/21/18     FL2 transmitted to all facilities within larger geographic area on       Patient informed that his/her managed care company has contracts with or will negotiate with certain facilities, including the following:        Yes   Patient/family informed of bed offers received.  Patient chooses bed at [facility]     Physician recommends and patient chooses bed at [facility]    Patient to be transferred to [facility] on 01/22/18.  Patient to be transferred to facility by [facility]     Patient  family notified on 01/22/18 of transfer.  Name of family member notified:  wife [name]     PHYSICIAN       Additional Comment:    _______________________________________________ Nila Nephew, LCSW 01/22/2018, 2:56 PM [phone]

## 2018-01-22 NOTE — Progress Notes (Signed)
Patient discharge at shift change via EMS in no acute distress. Alert and verbally responsive. Denies any pain or discomfort. Report called to [facility] staff  by day shift nurse.

## 2018-01-22 NOTE — Progress Notes (Signed)
Provided DC information to [facility] via the [place]. Facility began insurance authorization yesterday 01/22/18. Awaiting approval for pt to admit.   Sharren Bridge, MSW, LCSW Clinical Social Work 01/22/2018 [phone]

## 2018-01-22 NOTE — Progress Notes (Signed)
Physical Therapy Treatment Patient Details Name: Ivan Anderson MRN: [id number] DOB: 1940-09-25 Today's Date: 01/22/2018    History of Present Illness 77 y.o. male with history of paroxysmal atrial fibrillation on Eliquis, diabetes mellitus type 2, hyperlipidemia, dementia and presented with c/o dizziness.    PT Comments    Pt tolerated increased ambulation distance of 130' with RW, no loss of balance, supervision for safety.    Follow Up Recommendations  SNF     Equipment Recommendations  None recommended by PT    Recommendations for Other Services       Precautions / Restrictions Precautions Precautions: Fall Restrictions Weight Bearing Restrictions: No    Mobility  Bed Mobility Overal bed mobility: Needs Assistance Bed Mobility: Supine to Sit;Sit to Supine     Supine to sit: Supervision Sit to supine: Supervision   General bed mobility comments: used bedrail, no assist needed  Transfers Overall transfer level: Needs assistance Equipment used: Rolling walker (2 wheeled) Transfers: Sit to/from Stand Sit to Stand: Min guard;From elevated surface         General transfer comment: VCs for hand placement  Ambulation/Gait Ambulation/Gait assistance: Min guard Gait Distance (Feet): 130 Feet Assistive device: Rolling walker (2 wheeled) Gait Pattern/deviations: Step-through pattern;Decreased stride length     General Gait Details: no loss of balance, pt denied dizziness   Stairs             Wheelchair Mobility    Modified Rankin (Stroke Patients Only)       Balance Overall balance assessment: Needs assistance;History of Falls   Sitting balance-Leahy Scale: Good     Standing balance support: Bilateral upper extremity supported Standing balance-Leahy Scale: Fair                              Cognition Arousal/Alertness: Awake/alert Behavior During Therapy: WFL for tasks assessed/performed Overall Cognitive Status: History of  cognitive impairments - at baseline                                 General Comments: spouse reports dementia, pt unable to state date however orientated to place and person      Exercises      General Comments        Pertinent Vitals/Pain Pain Assessment: No/denies pain    Home Living                      Prior Function            PT Goals (current goals can now be found in the care plan section) Acute Rehab PT Goals PT Goal Formulation: With patient/family Time For Goal Achievement: 01/27/18 Potential to Achieve Goals: Good Progress towards PT goals: Progressing toward goals    Frequency    Min 3X/week      PT Plan Current plan remains appropriate    Co-evaluation              AM-PAC PT "6 Clicks" Daily Activity  Outcome Measure  Difficulty turning over in bed (including adjusting bedclothes, sheets and blankets)?: A Little Difficulty moving from lying on back to sitting on the side of the bed? : A Little Difficulty sitting down on and standing up from a chair with arms (e.g., wheelchair, bedside commode, etc,.)?: A Little Help needed moving to and from a bed to chair (including a  wheelchair)?: A Little Help needed walking in hospital room?: A Little Help needed climbing 3-5 steps with a railing? : A Lot 6 Click Score: 17    End of Session Equipment Utilized During Treatment: Gait belt Activity Tolerance: Patient tolerated treatment well Patient left: in bed;with call bell/phone within reach;with family/visitor present Nurse Communication: Mobility status PT Visit Diagnosis: Muscle weakness (generalized) (M62.81);Unsteadiness on feet (R26.81)     Time: 3428-7681 PT Time Calculation (min) (ACUTE ONLY): 13 min  Charges:  $Gait Training: 8-22 mins                     Blondell Reveal Kistler PT 01/22/2018  Acute Rehabilitation Services Pager [phone] Office [phone]

## 2018-01-22 NOTE — Telephone Encounter (Signed)
thx

## 2018-01-22 NOTE — Telephone Encounter (Signed)
Patients wife returned my call regarding TCM. States patient is currently in hospital and will be discharged today to SNF for rehab for 20 days.

## 2018-01-22 NOTE — Discharge Summary (Signed)
Physician Discharge Summary  QUEST TAVENNER PQZ:[id number] DOB: Jan 03, 1941 DOA: 01/19/2018  PCP: Colon Branch, MD  Admit date: 01/19/2018 Discharge date: 01/22/2018  Admitted From: Home Disposition: Skilled nursing facility  Recommendations for Outpatient Follow-up:  1. Follow up with MD at SNF in 1 week  Home Health: None Equipment/Devices: Per therapy at the facility  Discharge Condition: Fair CODE STATUS: Full code Diet recommendation: Heart Healthy / Carb Modified    Discharge Diagnoses:  Principal discharge diagnosis   Dizziness  Active Problems:   Essential hypertension   Dementia, vascular (HCC)   Paroxysmal atrial fibrillation (HCC)   HLD (hyperlipidemia)   Chronic diastolic CHF (congestive heart failure) (HCC)   Weakness generalized   Ataxia  Brief narrative/HPI 77 year old male with history of paroxysmal A. fib on Eliquis, diabetes mellitus type 2, hyperlipidemia and vascular dementia (mild) brought to the ED with increasing weakness with difficulty to ambulate and dizziness.  Per wife he has chronic gait issues but this has been getting worse.  1 week prior to admission he was getting increasingly weak and difficult getting out of bed.  No fall, sustained any injury, new visual symptoms, nausea, vomiting, chest pain, shortness of breath, palpitations, abdominal pain, bowel or urinary symptoms. In the ED, vitals were stable.  Blood work was unremarkable except for lactic acidosis.  CT of the abdomen showed large stool burden.  Head CT unremarkable.  Patient placed in observation for further work-up.  Hospital course Dizziness This has been ongoing for some time but appears that he also has not been eating or drinking very well.  From history it appears that patient may be orthostatic although it was not done on admission.  He did receive some IV fluids and blood pressure has remained stable. MRI of the brain without any findings of stroke.  2D echo done showed normal  EF and no wall motion abnormality.  Ultrasound of the carotid without significant stenosis. Seen by PT and recommends SNF.  Elevated lactic acid Possibly due to dehydration.  Was 4.95 on presentation which normalized with IV fluids.  Constipation Resolved.  Paroxysmal A. fib Rate controlled.  Continue Eliquis  Type 2 diabetes mellitus Continue home dose insulin and metformin.  Vascular dementia, mild at baseline Continue Namenda.  Chronic diastolic CHF Euvolemic and stable.  Continue statin.  BPH Continue Flomax  Family comunication: Wife at bedside  Procedure: CT head, MRI brain, 2D echo and carotid Doppler  Consult: None Disposition: SNF  Discharge Instructions  Discharge Instructions    Call MD for:  difficulty breathing, headache or visual disturbances   Complete by:  As directed    Call MD for:  persistant dizziness or light-headedness   Complete by:  As directed    Call MD for:  persistant nausea and vomiting   Complete by:  As directed    Call MD for:  severe uncontrolled pain   Complete by:  As directed    Diet - low sodium heart healthy   Complete by:  As directed    Increase activity slowly   Complete by:  As directed      Allergies as of 01/22/2018      Reactions   Pseudoephedrine Other (See Comments)   Dx: A. Fib.       Medication List    STOP taking these medications   acetaminophen 500 MG tablet Commonly known as:  TYLENOL   benzonatate 100 MG capsule Commonly known as:  TESSALON   doxycycline 100 MG  tablet Commonly known as:  VIBRA-TABS   fluticasone 50 MCG/ACT nasal spray Commonly known as:  FLONASE   tamsulosin 0.4 MG Caps capsule Commonly known as:  FLOMAX     TAKE these medications   albuterol 108 (90 Base) MCG/ACT inhaler Commonly known as:  PROVENTIL HFA;VENTOLIN HFA Inhale 2 puffs into the lungs every 6 (six) hours as needed for wheezing or shortness of breath.   apixaban 5 MG Tabs tablet Commonly known as:   ELIQUIS Take 1 tablet (5 mg total) by mouth 2 (two) times daily.   buPROPion 75 MG tablet Commonly known as:  WELLBUTRIN Take 1 tablet (75 mg total) by mouth 2 (two) times daily.   cholecalciferol 1000 units tablet Commonly known as:  VITAMIN D Take 1,000 Units by mouth daily.   Cinnamon 500 MG capsule Take 1,000 mg by mouth 2 (two) times daily.   GERITOL COMPLETE PO Take 1 tablet by mouth daily.   insulin glargine 100 UNIT/ML injection Commonly known as:  LANTUS Inject 5 Units into the skin at bedtime.   meclizine 12.5 MG tablet Commonly known as:  ANTIVERT Take 1 tablet (12.5 mg total) by mouth 2 (two) times daily as needed for dizziness. What changed:  when to take this   memantine 10 MG tablet Commonly known as:  NAMENDA Take 1 tablet (10 mg total) 2 (two) times daily by mouth.   metFORMIN 1000 MG tablet Commonly known as:  GLUCOPHAGE Take 1 tablet (1,000 mg total) by mouth 2 (two) times daily with a meal.   multivitamin tablet Take 1 tablet by mouth every morning.   omega-3 acid ethyl esters 1 g capsule Commonly known as:  LOVAZA Take 2 g by mouth 2 (two) times daily.   omeprazole 20 MG capsule Commonly known as:  PRILOSEC Take 20 mg by mouth daily as needed (heart burn).   polyvinyl alcohol 1.4 % ophthalmic solution Commonly known as:  LIQUIFILM TEARS Place 1 drop into both eyes 3 (three) times daily as needed for dry eyes.   simvastatin 40 MG tablet Commonly known as:  ZOCOR Take 40 mg by mouth daily.   vitamin C 500 MG tablet Commonly known as:  ASCORBIC ACID Take 500 mg by mouth daily.      Follow-up Information    MD at SNF in 1 week Follow up.          Allergies  Allergen Reactions  . Pseudoephedrine Other (See Comments)    Dx: A. Fib.      Procedures/Studies: Dg Chest 2 View  Result Date: 01/19/2018 CLINICAL DATA:  Weakness, nausea and vomiting and fever. EXAM: CHEST - 2 VIEW COMPARISON:  Chest radiograph 12/20/2017 FINDINGS:  Monitoring leads overlie the patient. Normal cardiac and mediastinal contours. No consolidative pulmonary opacities. No pleural effusion or pneumothorax. IMPRESSION: No acute cardiopulmonary process. Electronically Signed   By: Lovey Newcomer M.D.   On: 01/19/2018 14:52   Ct Head Wo Contrast  Result Date: 01/19/2018 CLINICAL DATA:  Pt brought in by wife who reports patient appears dehydrated, dizzy, sleeps a lot more throughout the day. EXAM: CT HEAD WITHOUT CONTRAST TECHNIQUE: Contiguous axial images were obtained from the base of the skull through the vertex without intravenous contrast. COMPARISON:  12/05/2017 FINDINGS: Brain: No evidence of acute infarction, hemorrhage, hydrocephalus, extra-axial collection or mass lesion/mass effect. There is ventricular sulcal enlargement reflecting mild diffuse atrophy. Patchy white matter hypoattenuation is noted consistent with moderate chronic microvascular ischemic change. Vascular: No hyperdense vessel or  unexpected calcification. Skull: Normal. Negative for fracture or focal lesion. Sinuses/Orbits: Globes and orbits are unremarkable. Visualized sinuses and mastoid air cells are clear. Other: None. IMPRESSION: 1. No acute intracranial abnormalities. 2. Atrophy and chronic microvascular ischemic change, stable from the prior study. Electronically Signed   By: Lajean Manes M.D.   On: 01/19/2018 16:32   Mr Brain Wo Contrast  Result Date: 01/19/2018 CLINICAL DATA:  Dizziness and gait instability. EXAM: MRI HEAD WITHOUT CONTRAST TECHNIQUE: Multiplanar, multiecho pulse sequences of the brain and surrounding structures were obtained without intravenous contrast. COMPARISON:  Head CT 01/19/2018 FINDINGS: The examination is degraded by motion. BRAIN: There is no acute infarct, acute hemorrhage, hydrocephalus or extra-axial collection. The midline structures are normal. No midline shift or other mass effect. Diffuse confluent hyperintense T2-weighted signal within the  periventricular, deep and juxtacortical white matter, most commonly due to chronic ischemic microangiopathy. Generalized atrophy without lobar predilection. 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 Susceptibility-sensitive sequences show no chronic microhemorrhage or superficial siderosis. VASCULAR: Major intracranial arterial and venous sinus flow voids are normal. SKULL AND UPPER CERVICAL SPINE: Calvarial bone marrow signal is normal. There is no skull base mass. Visualized upper cervical spine and soft tissues are normal. SINUSES/ORBITS: No fluid levels or advanced mucosal thickening. No mastoid or middle ear effusion. The orbits are normal. IMPRESSION: 1. Images degraded by motion artifact. 2. No acute abnormality. 3. 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 Electronically Signed   By: Ulyses Jarred M.D.   On: 01/19/2018 22:18   Ct Abdomen Pelvis W Contrast  Result Date: 01/19/2018 CLINICAL DATA:  Pt brought in by wife who reports patient appears dehydrated, dizzy, sleeps a lot more throughout the day and constipated for over 1 week. EXAM: CT ABDOMEN AND PELVIS WITH CONTRAST TECHNIQUE: Multidetector CT imaging of the abdomen and pelvis was performed using the standard protocol following bolus administration of intravenous contrast. CONTRAST:  184mL ISOVUE-300 IOPAMIDOL (ISOVUE-300) INJECTION 61% COMPARISON:  CT, 09/21/2015 FINDINGS: Lower chest: In the posterior lower lobes there is bronchial wall thickening and peribronchovascular opacities and lung base linear opacities. The linear peribronchovascular opacities are likely atelectasis. Bronchial wall thickening may reflect chronic or acute bronchial inflammation. Hepatobiliary: No focal liver abnormality is seen. No gallstones, gallbladder wall thickening, or biliary dilatation. Pancreas: Unremarkable. No pancreatic ductal dilatation or surrounding inflammatory changes. Spleen: Normal in size  without focal abnormality. Adrenals/Urinary Tract: No adrenal masses. Bilateral renal cortical thinning. Symmetric renal enhancement and excretion. 11 mm lower pole left renal cyst. Tiny cortical low-density lesion from the upper pole the right kidney also likely a cyst. No other masses, no stones and no hydronephrosis. Ureters are normal in course and in caliber. Bladder is unremarkable. Stomach/Bowel: Rectum is markedly distended with stool. No rectal wall thickening. There is moderate increased stool noted throughout the colon. No colonic wall thickening or inflammation. Stomach and small bowel are unremarkable. Vascular/Lymphatic: Aortic atherosclerosis. No enlarged abdominal or pelvic lymph nodes. Reproductive: Prostate mildly enlarged, similar to the prior CT. Other: No abdominal wall hernia or abnormality. No abdominopelvic ascites. Musculoskeletal: Well-positioned bilateral total hip arthroplasties. No fracture or acute finding. No osteoblastic or osteolytic lesions. IMPRESSION: 1. No acute findings within the abdomen or pelvis. 2. Marked increased stool in the rectum with moderate increased stool throughout the colon. No bowel obstruction or inflammation. 3. Lower lobe bronchial wall thickening with peribronchovascular opacities, right greater than left. This is likely a combination of chronic or acute  bronchial inflammation with associated atelectasis. Consider bronchopneumonia in the right lower lobe if there are consistent clinical findings. 4. Aortic atherosclerosis. Electronically Signed   By: Lajean Manes M.D.   On: 01/19/2018 16:29    2D echo Left ventricle:  The cavity size was normal. There was moderate concentric hypertrophy. Systolic function was normal. The estimated ejection fraction was in the range of 60% to 65%. Wall motion was normal; there were no regional wall motion abnormalities. Doppler parameters are consistent with abnormal left ventricular relaxation (grade 1 diastolic  dysfunction). The E/e&' ratio is <8, suggesting normal LV filling pressure.  Subjective: Denies any specific symptoms.  No overnight events.  Reports being able to ambulate with the physical therapist.  Discharge Exam: Vitals:   01/22/18 0612 01/22/18 1058  BP: (!) 168/87   Pulse: (!) 54   Resp: 20   Temp: (!) 97.4 F (36.3 C)   SpO2: 97% 94%   Vitals:   01/21/18 1916 01/21/18 2125 01/22/18 0612 01/22/18 1058  BP: (!) 148/73 129/75 (!) 168/87   Pulse: (!) 50 (!) 51 (!) 54   Resp:  18 20   Temp:  98.3 F (36.8 C) (!) 97.4 F (36.3 C)   TempSrc:  Oral    SpO2:  96% 97% 94%  Weight:      Height:        General:  elderly male not in distress HEENT: Moist mucosa, supple neck Chest: Clear bilaterally CVs: S1-S2 regular, no murmurs GI: Soft, nondistended, nontender Musculoskeletal: Warm, no edema CNs: Alert and oriented, nonfocal     The results of significant diagnostics from this hospitalization (including imaging, microbiology, ancillary and laboratory) are listed below for reference.     Microbiology: Recent Results (from the past 240 hour(s))  Culture, Urine     Status: Abnormal   Collection Time: 01/20/18  8:44 AM  Result Value Ref Range Status   Specimen Description   Final    URINE, CLEAN CATCH Performed at [facility], [place] [address] [place], [place] [address]    Special Requests   Final    NONE Performed at [facility], [place] [address], [place], [place] [address]    Culture (A)  Final    <10,000 COLONIES/mL INSIGNIFICANT GROWTH Performed at [place] [address], [place],  [address]    Report Status 01/21/2018 FINAL  Final     Labs: BNP (last 3 results) No results for input(s): BNP in the last 8760 hours. Basic Metabolic Panel: Recent Labs  Lab 01/19/18 1350 01/20/18 0514  NA 137 141  K 4.3 3.7  CL 101 105  CO2 21* 28  GLUCOSE 228* 86  BUN 25* 16  CREATININE 0.80 0.63  CALCIUM  8.9 8.8*   Liver Function Tests: Recent Labs  Lab 01/19/18 1350  AST 23  ALT 22  ALKPHOS 96  BILITOT 0.2*  PROT 6.3*  ALBUMIN 3.6   Recent Labs  Lab 01/19/18 1350  LIPASE 34   No results for input(s): AMMONIA in the last 168 hours. CBC: Recent Labs  Lab 01/19/18 1350 01/20/18 0514  WBC 7.5 5.6  NEUTROABS 6.0  --   HGB 13.0 12.9*  HCT 39.2 39.5  MCV 93.8 95.0  PLT 196 166   Cardiac Enzymes: Recent Labs  Lab 01/19/18 1350  TROPONINI <0.03   BNP: Invalid input(s): POCBNP CBG: Recent Labs  Lab 01/21/18 1246 01/21/18 1654 01/21/18 1935 01/21/18 2127 01/22/18 [place]  157* 184* 221* 125* 111*   D-Dimer No results for input(s): DDIMER in the last 72 hours. Hgb A1c No results for input(s): HGBA1C in the last 72 hours. Lipid Profile No results for input(s): CHOL, HDL, LDLCALC, TRIG, CHOLHDL, LDLDIRECT in the last 72 hours. Thyroid function studies No results for input(s): TSH, T4TOTAL, T3FREE, THYROIDAB in the last 72 hours.  Invalid input(s): FREET3 Anemia work up No results for input(s): VITAMINB12, FOLATE, FERRITIN, TIBC, IRON, RETICCTPCT in the last 72 hours. Urinalysis    Component Value Date/Time   COLORURINE YELLOW 01/20/2018 0844   APPEARANCEUR CLEAR 01/20/2018 0844   LABSPEC 1.014 01/20/2018 0844   PHURINE 5.0 01/20/2018 0844   GLUCOSEU NEGATIVE 01/20/2018 0844   GLUCOSEU NEGATIVE 12/24/2016 1522   HGBUR NEGATIVE 01/20/2018 0844   BILIRUBINUR NEGATIVE 01/20/2018 0844   KETONESUR NEGATIVE 01/20/2018 0844   PROTEINUR NEGATIVE 01/20/2018 0844   UROBILINOGEN 0.2 12/24/2016 1522   NITRITE NEGATIVE 01/20/2018 0844   LEUKOCYTESUR NEGATIVE 01/20/2018 0844   Sepsis Labs Invalid input(s): PROCALCITONIN,  WBC,  LACTICIDVEN Microbiology Recent Results (from the past 240 hour(s))  Culture, Urine     Status: Abnormal   Collection Time: 01/20/18  8:44 AM  Result Value Ref Range Status   Specimen Description   Final    URINE, CLEAN  CATCH Performed at [facility], [place] [address] [place], [place] [address]    Special Requests   Final    NONE Performed at [facility], [place] [address] [place], [place] [address]    Culture (A)  Final    <10,000 COLONIES/mL INSIGNIFICANT GROWTH Performed at [facility] [address], [place], [place] [address]    Report Status 01/21/2018 FINAL  Final     Time coordinating discharge: 25  SIGNED:   Louellen Molder, MD  [facility] 01/22/2018, 11:12 AM Pager   If 7PM-7AM, please contact night-coverage [email or web address] Password [email or web address]

## 2018-01-23 DIAGNOSIS — E119 Type 2 diabetes mellitus without complications: Secondary | ICD-10-CM | POA: Diagnosis not present

## 2018-01-23 DIAGNOSIS — I10 Essential (primary) hypertension: Secondary | ICD-10-CM | POA: Diagnosis not present

## 2018-01-23 DIAGNOSIS — I480 Paroxysmal atrial fibrillation: Secondary | ICD-10-CM | POA: Diagnosis not present

## 2018-01-23 DIAGNOSIS — R42 Dizziness and giddiness: Secondary | ICD-10-CM | POA: Diagnosis not present

## 2018-01-24 DIAGNOSIS — I480 Paroxysmal atrial fibrillation: Secondary | ICD-10-CM | POA: Diagnosis not present

## 2018-01-24 DIAGNOSIS — E119 Type 2 diabetes mellitus without complications: Secondary | ICD-10-CM | POA: Diagnosis not present

## 2018-01-24 DIAGNOSIS — R42 Dizziness and giddiness: Secondary | ICD-10-CM | POA: Diagnosis not present

## 2018-02-04 DIAGNOSIS — R300 Dysuria: Secondary | ICD-10-CM | POA: Diagnosis not present

## 2018-02-10 DIAGNOSIS — I10 Essential (primary) hypertension: Secondary | ICD-10-CM | POA: Diagnosis not present

## 2018-02-10 DIAGNOSIS — E119 Type 2 diabetes mellitus without complications: Secondary | ICD-10-CM | POA: Diagnosis not present

## 2018-02-10 DIAGNOSIS — R42 Dizziness and giddiness: Secondary | ICD-10-CM | POA: Diagnosis not present

## 2018-02-10 DIAGNOSIS — I480 Paroxysmal atrial fibrillation: Secondary | ICD-10-CM | POA: Diagnosis not present

## 2018-02-11 ENCOUNTER — Ambulatory Visit: Payer: Medicare Other | Admitting: Internal Medicine
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
## 2018-02-13 ENCOUNTER — Encounter: Payer: Self-pay | Admitting: Neurology

## 2018-02-13 ENCOUNTER — Ambulatory Visit: Payer: Medicare Other | Admitting: Neurology

## 2018-02-13 VITALS — BP 122/66 | HR 72 | Ht 78.0 in | Wt 220.0 lb

## 2018-02-13 DIAGNOSIS — F0150 Vascular dementia without behavioral disturbance: Secondary | ICD-10-CM

## 2018-02-18 ENCOUNTER — Encounter: Payer: Self-pay | Admitting: Internal Medicine

## 2018-02-18 ENCOUNTER — Ambulatory Visit (INDEPENDENT_AMBULATORY_CARE_PROVIDER_SITE_OTHER): Payer: Medicare Other | Admitting: Internal Medicine

## 2018-02-18 VITALS — BP 118/74 | HR 85 | Temp 98.2°F | Resp 16 | Ht 78.0 in | Wt 221.4 lb

## 2018-02-18 DIAGNOSIS — F0151 Vascular dementia with behavioral disturbance: Secondary | ICD-10-CM | POA: Diagnosis not present

## 2018-02-18 DIAGNOSIS — R627 Adult failure to thrive: Secondary | ICD-10-CM | POA: Diagnosis not present

## 2018-02-18 DIAGNOSIS — Z794 Long term (current) use of insulin: Secondary | ICD-10-CM | POA: Diagnosis not present

## 2018-02-18 DIAGNOSIS — E1159 Type 2 diabetes mellitus with other circulatory complications: Secondary | ICD-10-CM | POA: Diagnosis not present

## 2018-02-18 DIAGNOSIS — I2510 Atherosclerotic heart disease of native coronary artery without angina pectoris: Secondary | ICD-10-CM | POA: Diagnosis not present

## 2018-02-18 DIAGNOSIS — F01518 Vascular dementia, unspecified severity, with other behavioral disturbance: Secondary | ICD-10-CM

## 2018-02-18 LAB — BASIC METABOLIC PANEL
BUN: 27 mg/dL — AB (ref 6–23)
CHLORIDE: 98 meq/L (ref 96–112)
CO2: 30 mEq/L (ref 19–32)
Calcium: 9.4 mg/dL (ref 8.4–10.5)
Creatinine, Ser: 0.74 mg/dL (ref 0.40–1.50)
GFR: 108.26 mL/min (ref >60.00)
GLUCOSE: 136 mg/dL — AB (ref 70–99)
POTASSIUM: 4.4 meq/L (ref 3.5–5.1)
Sodium: 139 mEq/L (ref 135–145)

## 2018-02-18 LAB — HEMOGLOBIN A1C: Hgb A1c MFr Bld: 6.3 % (ref 4.6–6.5)

## 2018-02-18 LAB — CBC WITH DIFFERENTIAL/PLATELET
BASOS PCT: 0.1 % (ref 0.0–3.0)
Basophils Absolute: 0 10*3/uL (ref 0.0–0.1)
EOS PCT: 0.5 % (ref 0.0–5.0)
Eosinophils Absolute: 0 10*3/uL (ref 0.0–0.7)
HCT: 43.7 % (ref 39.0–52.0)
Hemoglobin: 14.9 g/dL (ref 13.0–17.0)
LYMPHS ABS: 1 10*3/uL (ref 0.7–4.0)
Lymphocytes Relative: 10.3 % — ABNORMAL LOW (ref 12.0–46.0)
MCHC: 34.1 g/dL (ref 30.0–36.0)
MCV: 93.2 fl (ref 78.0–100.0)
MONO ABS: 0.5 10*3/uL (ref 0.1–1.0)
Monocytes Relative: 4.8 % (ref 3.0–12.0)
NEUTROS ABS: 7.9 10*3/uL — AB (ref 1.4–7.7)
Neutrophils Relative %: 84.3 % — ABNORMAL HIGH (ref 43.0–77.0)
PLATELETS: 256 10*3/uL (ref 150.0–400.0)
RBC: 4.69 Mil/uL (ref 4.22–5.81)
RDW: 13.6 % (ref 11.5–15.5)
WBC: 9.4 10*3/uL (ref 4.0–10.5)

## 2018-02-18 MED ORDER — QUETIAPINE FUMARATE 25 MG PO TABS: 25.0000 mg | ORAL_TABLET | Freq: Every day | ORAL | 3 refills | Status: AC

## 2018-02-18 NOTE — Progress Notes (Signed)
Pre visit review using our clinic review tool, if applicable. No additional management support is needed unless otherwise documented below in the visit note. 

## 2018-02-18 NOTE — Patient Instructions (Addendum)
GO TO THE LAB : Get the blood work     GO TO THE FRONT DESK Schedule your next appointment for a check up in 2 months     HOLD insulin  Call if blood sugars more than 200   Needs to be at a  memory unit, please contact the [place] or any of the other units in [facility]  Call 911 if you feel   he or you are unsafe   Try Seroquel 25 mg 1 tablet at bedtime.  This is to improve his behavior.   [place] [facility] [phone] [phone] [phone]  [place] [phone]  [place] [phone]  [place] [phone]

## 2018-02-18 NOTE — Progress Notes (Signed)
Subjective:    Patient ID: Ivan [facility], male    DOB: 10-20-1940, 77 y.o.   MRN: [id number]  DOS:  02/18/2018 Type of visit - description : ROV, here w/ wife Interval history:  Since the last office visit, has been in the ER several times (weakness, orthostatic hypotension, weakness), the last ER visit was 01/19/2018 and he was admitted.  Chart is reviewed: He had dizziness, felt to be -at least in part- due to poor eating and drinking, MRI of the brain with no acute findings, echo with normal EF.  Ultrasound showed no significant carotid stenosis. He was eventually seen by PT,  recommended SNF. Had constipation which resolved After SNF he went home and is here for follow-up.  Review of Systems The wife reports that since he left the nursing home he is doing "okay" Appetite is good some days. No fever chills No nausea or vomiting No constipation He does have some dry cough mostly when he lays flat in bed. A lot "clearing the throat". He is now incontinent for urine and stools. His memory is worse, he is getting aggressive sometimes.  "I thought he would hit me the other day". CBG s sometimes low.   Past Medical History:  Diagnosis Date  . Anxiety   . Atrial fibrillation, new onset ([place]) 03/27/2015  . CAD (coronary artery disease)   . Colon polyp    Cscope at [facility] aprox 2006, repeated 2010 (-), next 5 years  . Dementia ([place])    per family diagnosed Aug-2016  . Diabetes mellitus with neuropathy ([place])   . Diaphragm paralysis    R, per pt   . DJD (degenerative joint disease)   . Erectile dysfunction   . Eye muscle paralysis    congenital, lateral rectus, left  . Gait disorder   . Heart palpitations   . Hemorrhoids   . Hyperlipidemia   . Microscopic hematuria 2017   seeing urology  . Mild cognitive disorder    Unspecified mild NCD  . Peripheral neuropathy   . Skin cancer    sees derm    Past Surgical History:  Procedure Laterality Date  . ANKLE FUSION Right   .  APPENDECTOMY    . CARPAL TUNNEL RELEASE     B, 2000  . CYSTOSCOPY  2017  . HIP SURGERY Right 1964   pins  . HIP SURGERY  2009   RIGHT--s/p hip replacement, s/p revision August 2009 due to dislocation x 3   . HIP SURGERY  04-2012, 2017   R hip revision  . KNEE SURGERY Left 2005  . MEDIAL PARTIAL KNEE REPLACEMENT Right 08-2012  . TOTAL HIP ARTHROPLASTY  08-09-15   [facility]    Social History   Socioeconomic History  . Marital status: Married    Spouse name: Not on file  . Number of children: Not on file  . Years of education: Not on file  . Highest education level: Not on file  Occupational History  . Occupation: Cabin crew  Social Needs  . Financial resource strain: Not on file  . Food insecurity:    Worry: Not on file    Inability: Not on file  . Transportation needs:    Medical: Not on file    Non-medical: Not on file  Tobacco Use  . Smoking status: Former Smoker    Last attempt to quit: 01/02/1981    Years since quitting: 37.1  . Smokeless tobacco: Never Used  . Tobacco comment: quit 1980s  Substance and Sexual Activity  . Alcohol use: No    Alcohol/week: 0.0 standard drinks  . Drug use: No  . Sexual activity: Not on file  Lifestyle  . Physical activity:    Days per week: Not on file    Minutes per session: Not on file  . Stress: Not on file  Relationships  . Social connections:    Talks on phone: Not on file    Gets together: Not on file    Attends religious service: Not on file    Active member of club or organization: Not on file    Attends meetings of clubs or organizations: Not on file    Relationship status: Not on file  . Intimate partner violence:    Fear of current or ex partner: Not on file    Emotionally abused: Not on file    Physically abused: Not on file    Forced sexual activity: Not on file  Other Topics Concern  . Not on file  Social History Narrative  . Not on file      Allergies as of 02/18/2018      Reactions    Pseudoephedrine Other (See Comments)   Dx: A. Fib.       Medication List        Accurate as of 02/18/18 10:34 AM. Always use your most recent med list.          albuterol 108 (90 Base) MCG/ACT inhaler Commonly known as:  PROVENTIL HFA;VENTOLIN HFA Inhale 2 puffs into the lungs every 6 (six) hours as needed for wheezing or shortness of breath.   apixaban 5 MG Tabs tablet Commonly known as:  ELIQUIS Take 1 tablet (5 mg total) by mouth 2 (two) times daily.   buPROPion 75 MG tablet Commonly known as:  WELLBUTRIN Take 1 tablet (75 mg total) by mouth 2 (two) times daily.   cholecalciferol 1000 units tablet Commonly known as:  VITAMIN D Take 2,000 Units by mouth daily.   Cinnamon 500 MG capsule Take 1,000 mg by mouth 2 (two) times daily.   GERITOL COMPLETE PO Take 1 tablet by mouth daily.   insulin glargine 100 UNIT/ML injection Commonly known as:  LANTUS Inject 5 Units into the skin at bedtime.   meclizine 12.5 MG tablet Commonly known as:  ANTIVERT Take 1 tablet (12.5 mg total) by mouth 2 (two) times daily as needed for dizziness.   memantine 10 MG tablet Commonly known as:  NAMENDA Take 1 tablet (10 mg total) 2 (two) times daily by mouth.   metFORMIN 1000 MG tablet Commonly known as:  GLUCOPHAGE Take 1 tablet (1,000 mg total) by mouth 2 (two) times daily with a meal.   multivitamin tablet Take 1 tablet by mouth every morning.   omega-3 acid ethyl esters 1 g capsule Commonly known as:  LOVAZA Take 2 g by mouth 2 (two) times daily.   omeprazole 20 MG capsule Commonly known as:  PRILOSEC Take 20 mg by mouth daily as needed (heart burn).   polyvinyl alcohol 1.4 % ophthalmic solution Commonly known as:  LIQUIFILM TEARS Place 1 drop into both eyes 3 (three) times daily as needed for dry eyes.   simvastatin 40 MG tablet Commonly known as:  ZOCOR Take 40 mg by mouth daily.   tamsulosin 0.4 MG Caps capsule Commonly known as:  FLOMAX Take 0.4 mg by mouth at  bedtime.   vitamin C 500 MG tablet Commonly known as:  ASCORBIC ACID Take 500  mg by mouth daily.          Objective:   Physical Exam BP 118/74 (BP Location: Right Arm, Patient Position: Sitting, Cuff Size: Small)   Pulse 85   Temp 98.2 F (36.8 C) (Oral)   Resp 16   Ht 6\' 6"  (1.981 m)   Wt 221 lb 6 oz (100.4 kg)   SpO2 95%   BMI 25.58 kg/m   General:   Well developed, sleepy but arousable. HEENT:  Normocephalic . Face symmetric, atraumatic Lungs:  CTA B Normal respiratory effort, no intercostal retractions, no accessory muscle use. Heart: RRR,  no murmur.  No pretibial edema bilaterally  Skin: Not pale. Not jaundice Neurologic:  Sleepy but arousable, follows simple commands Speech normal, gait assisted by a rolling walker Psych--  Behavior appropriate. No anxious or depressed appearing.      Assessment & Plan:    Assessment -- seen regularly at [name], most chronic medical  issues f/u there DM with neuropathy  -- [facility] Hyperlipidemia Anxiety Gait disorder, multifactorial  CV: --CAD --A. 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  Rx not driving, family to provide medications.Intolerant to Aricept   H/o eye muscle paralysis congenital  PLAN:  Failure to thrive: Since the last visit, went to the ER 3 times, was admitted to the hospital and subsequently to a SNF from October 16 to November 5. He is currently at home under the care of the wife. Ambulatory CBGs are sometimes in the low side, as low as 61. PT is following him at home, STis supposed to see him soon.  He has cough mostly when he lays down  Situation is very difficult, he has advanced dementia, urine and bowel incontinence. He is sometimes very aggressive. We had extensive discussion with the wife, he needs to be in a  memory unit for his own safe and the safe of her. Encouraged to call 911 if needed.  See AVS. Plan: BMP, CBC, A1c Hold insulin, call if CBGs more than 200 Continue PT at home, will see ST.  He would benefit from a hospital bed to prevent nocturnal cough when he lays flat Safety is a major concern, call 911 if needed, see AVS. Trial with a low-dose of Seroquel to try to improve behavioral issues.  Watch for excessive sedation. Needs placement, strongly encouraged the patient's wife to involve the family, call [name] today as they have some services, provided local contacts as well.  See AVS. DM, Dementia: see above RTC 2 months  Spent more than 35  minutes with the patient and his wife

## 2018-02-19 DIAGNOSIS — R627 Adult failure to thrive: Secondary | ICD-10-CM | POA: Insufficient documentation

## 2018-02-19 NOTE — Assessment & Plan Note (Signed)
Failure to thrive: Since the last visit, went to the ER 3 times, was admitted to the hospital and subsequently to a SNF from October 16 to November 5. He is currently at home under the care of the wife. Ambulatory CBGs are sometimes in the low side, as low as 61. PT is following him at home, STis supposed to see him soon.  He has cough mostly when he lays down  Situation is very difficult, he has advanced dementia, urine and bowel incontinence. He is sometimes very aggressive. We had extensive discussion with the wife, he needs to be in a memory unit for his own safe and the safe of her. Encouraged to call 911 if needed.  See AVS. Plan: BMP, CBC, A1c Hold insulin, call if CBGs more than 200 Continue PT at home, will see ST.  He would benefit from a hospital bed to prevent nocturnal cough when he lays flat Safety is a major concern, call 911 if needed, see AVS. Trial with a low-dose of Seroquel to try to improve behavioral issues.  Watch for excessive sedation. Needs placement, strongly encouraged the patient's wife to involve the family, call [name] today as they have some services, provided local contacts as well.  See AVS. DM, Dementia: see above RTC 2 months

## 2018-02-20 ENCOUNTER — Encounter: Payer: Self-pay | Admitting: Family Medicine

## 2018-02-20 ENCOUNTER — Ambulatory Visit (INDEPENDENT_AMBULATORY_CARE_PROVIDER_SITE_OTHER): Payer: Medicare Other | Admitting: Family Medicine

## 2018-02-20 VITALS — BP 110/68 | HR 80 | Temp 98.5°F | Ht 78.0 in | Wt 222.0 lb

## 2018-02-20 DIAGNOSIS — N3000 Acute cystitis without hematuria: Secondary | ICD-10-CM | POA: Diagnosis not present

## 2018-02-20 LAB — POC URINALSYSI DIPSTICK (AUTOMATED)
Bilirubin, UA: NEGATIVE
Blood, UA: NEGATIVE
Glucose, UA: NEGATIVE
Ketones, UA: NEGATIVE
LEUKOCYTES UA: NEGATIVE
NITRITE UA: NEGATIVE
PROTEIN UA: POSITIVE — AB
SPEC GRAV UA: 1.025 (ref 1.010–1.025)
UROBILINOGEN UA: 1 U/dL
pH, UA: 5.5 (ref 5.0–8.0)

## 2018-02-20 MED ORDER — CEPHALEXIN 500 MG PO CAPS: 500.0000 mg | ORAL_CAPSULE | Freq: Two times a day (BID) | ORAL | 0 refills | Status: AC

## 2018-02-20 NOTE — Progress Notes (Signed)
Chief Complaint  Patient presents with  . Abdominal Pain    Ivan Anderson is a 77 y.o. male here for possible UTI.  He has a history of dementia.  He is here with his wife and daughter.  Duration: 2 days. Symptoms: urinary frequency, lower abdominal pain and dysuria; his wife also thinks he may be more confused and weaker than usual Denies: hematuria, urinary hesitancy, urinary retention, fever, nausea, vomiting and leg pain He is circumcised He did have a urinary tract infection a year and a half ago.  ROS:  Constitutional: denies fever GU: As noted in HPI  Past Medical History:  Diagnosis Date  . Anxiety   . Atrial fibrillation, new onset ([place]) 03/27/2015  . CAD (coronary artery disease)   . Colon polyp    Cscope at [facility] aprox 2006, repeated 2010 (-), next 5 years  . Dementia ([place])    per family diagnosed Aug-2016  . Diabetes mellitus with neuropathy ([place])   . Diaphragm paralysis    R, per pt   . DJD (degenerative joint disease)   . Erectile dysfunction   . Eye muscle paralysis    congenital, lateral rectus, left  . Gait disorder   . Heart palpitations   . Hemorrhoids   . Hyperlipidemia   . Microscopic hematuria 2017   seeing urology  . Mild cognitive disorder    Unspecified mild NCD  . Peripheral neuropathy   . Skin cancer    sees derm    BP 110/68 (BP Location: Left Arm, Patient Position: Sitting, Cuff Size: Normal)   Pulse 80   Temp 98.5 F (36.9 C) (Oral)   Ht 6\' 6"  (1.981 m)   Wt 222 lb (100.7 kg)   SpO2 95%   BMI 25.65 kg/m  General: Awake, alert, appears stated age Heart: RRR Lungs: CTAB, normal respiratory effort, no accessory muscle usage Abd: BS+, soft, + suprapubic tenderness to palpation, ND, no masses or organomegaly MSK: No CVA tenderness, neg Lloyd's sign Psych: Limited judgment and insight  Acute cystitis without hematuria - Plan: cephALEXin (KEFLEX) 500 MG capsule, POCT Urinalysis Dipstick (Automated), Urine Culture  Empirically treat  with by daily Keflex for 7 days.  Await culture results.  If no improvement, he will need to follow-up.  I told the patient I would have a low threshold to present him to the emergency department. Stay hydrated. Seek immediate care if pt starts to develop fevers, new/worsening symptoms, uncontrollable N/V. F/u prn. The patient and his family members voiced understanding and agreement to the plan.  [place], DO 02/20/18 4:41 PM

## 2018-02-20 NOTE — Patient Instructions (Signed)
Stay hydrated.   Warning signs/symptoms: Uncontrollable nausea/vomiting, fevers, worsening symptoms despite treatment, confusion.  Give us around 2 business days to get culture back to you.  Let us know if you need anything. 

## 2018-02-20 NOTE — Progress Notes (Signed)
Pre visit review using our clinic review tool, if applicable. No additional management support is needed unless otherwise documented below in the visit note. 

## 2018-02-21 LAB — URINE CULTURE
MICRO NUMBER:: 91373140
RESULT: NO GROWTH
SPECIMEN QUALITY: ADEQUATE

## 2018-03-12 ENCOUNTER — Telehealth: Payer: Self-pay | Admitting: Internal Medicine

## 2018-03-12 NOTE — Telephone Encounter (Signed)
Will you be on look out for this please?

## 2018-03-12 NOTE — Telephone Encounter (Signed)
Copied from [place] [id number]. Topic: Quick Communication - See Telephone Encounter >> Mar 12, 2018 11:38 AM Reyne Dumas L wrote: CRM for notification. See Telephone encounter for: 03/12/18.  Catalina Lunger from [facility] calling:  States family has decided to move forward with nursing home placement so she is faxing over a FL2 for the doctor to review and sign.  Please fax back to Catalina Lunger when completed so she can assist pt in finding a memory care placement. Catalina Lunger phone [phone] Catalina Lunger fax [phone]

## 2018-03-13 NOTE — Telephone Encounter (Signed)
Noted, will do/SLS 12/05

## 2018-03-17 NOTE — Telephone Encounter (Signed)
Form received- placed in MD red folder for completion.

## 2018-03-18 NOTE — Telephone Encounter (Signed)
Received corrected FL2 form, meds reviewed and are correct, form signed and faxed to [facility] at [phone]. Form sent for scanning.

## 2018-03-18 NOTE — Telephone Encounter (Signed)
signed

## 2018-03-18 NOTE — Telephone Encounter (Signed)
Medications on FL2 does not correspond w/ our med list. I tried calling Jon Billings, CMSW at [facility] at [phone], no answer. I have faxed a medication list with note on FL2 form informing on medication discrepancies- instructed to correct and fax back for signature.

## 2018-04-07 NOTE — Telephone Encounter (Signed)
Date fixed and faxed back to [facility]- at [phone]. Form sent for scanning.

## 2018-04-07 NOTE — Telephone Encounter (Signed)
Ivan Anderson with [facility] stated that pt would be getting admitted. The 2nd page of the [facility] form is dated incorrectly. It is dated for 01/18/18 and should be 03/18/18. Faxing over form to be corrected and sent back. Please advise.

## 2018-04-08 DIAGNOSIS — E1159 Type 2 diabetes mellitus with other circulatory complications: Secondary | ICD-10-CM | POA: Diagnosis not present

## 2018-04-08 DIAGNOSIS — I10 Essential (primary) hypertension: Secondary | ICD-10-CM | POA: Diagnosis not present

## 2018-04-10 DIAGNOSIS — E1159 Type 2 diabetes mellitus with other circulatory complications: Secondary | ICD-10-CM | POA: Diagnosis not present

## 2018-04-10 DIAGNOSIS — G309 Alzheimer's disease, unspecified: Secondary | ICD-10-CM | POA: Diagnosis not present

## 2018-04-10 DIAGNOSIS — I5032 Chronic diastolic (congestive) heart failure: Secondary | ICD-10-CM | POA: Diagnosis not present

## 2018-04-10 DIAGNOSIS — R2689 Other abnormalities of gait and mobility: Secondary | ICD-10-CM | POA: Diagnosis not present

## 2018-04-10 DIAGNOSIS — R270 Ataxia, unspecified: Secondary | ICD-10-CM | POA: Diagnosis not present

## 2018-04-10 DIAGNOSIS — I480 Paroxysmal atrial fibrillation: Secondary | ICD-10-CM | POA: Diagnosis not present

## 2018-04-10 DIAGNOSIS — M6281 Muscle weakness (generalized): Secondary | ICD-10-CM | POA: Diagnosis not present

## 2018-04-10 DIAGNOSIS — Z9181 History of falling: Secondary | ICD-10-CM | POA: Diagnosis not present

## 2018-04-10 DIAGNOSIS — E785 Hyperlipidemia, unspecified: Secondary | ICD-10-CM | POA: Diagnosis not present

## 2018-04-11 DIAGNOSIS — Z9181 History of falling: Secondary | ICD-10-CM | POA: Diagnosis not present

## 2018-04-11 DIAGNOSIS — R2689 Other abnormalities of gait and mobility: Secondary | ICD-10-CM | POA: Diagnosis not present

## 2018-04-11 DIAGNOSIS — M6281 Muscle weakness (generalized): Secondary | ICD-10-CM | POA: Diagnosis not present

## 2018-04-11 DIAGNOSIS — E785 Hyperlipidemia, unspecified: Secondary | ICD-10-CM | POA: Diagnosis not present

## 2018-04-11 DIAGNOSIS — E1159 Type 2 diabetes mellitus with other circulatory complications: Secondary | ICD-10-CM | POA: Diagnosis not present

## 2018-04-11 DIAGNOSIS — R270 Ataxia, unspecified: Secondary | ICD-10-CM | POA: Diagnosis not present

## 2018-04-11 DIAGNOSIS — G309 Alzheimer's disease, unspecified: Secondary | ICD-10-CM | POA: Diagnosis not present

## 2018-04-11 DIAGNOSIS — I480 Paroxysmal atrial fibrillation: Secondary | ICD-10-CM | POA: Diagnosis not present

## 2018-04-11 DIAGNOSIS — I5032 Chronic diastolic (congestive) heart failure: Secondary | ICD-10-CM | POA: Diagnosis not present

## 2018-04-12 DIAGNOSIS — M6281 Muscle weakness (generalized): Secondary | ICD-10-CM | POA: Diagnosis not present

## 2018-04-12 DIAGNOSIS — G309 Alzheimer's disease, unspecified: Secondary | ICD-10-CM | POA: Diagnosis not present

## 2018-04-12 DIAGNOSIS — I480 Paroxysmal atrial fibrillation: Secondary | ICD-10-CM | POA: Diagnosis not present

## 2018-04-12 DIAGNOSIS — I5032 Chronic diastolic (congestive) heart failure: Secondary | ICD-10-CM | POA: Diagnosis not present

## 2018-04-12 DIAGNOSIS — R270 Ataxia, unspecified: Secondary | ICD-10-CM | POA: Diagnosis not present

## 2018-04-12 DIAGNOSIS — E1159 Type 2 diabetes mellitus with other circulatory complications: Secondary | ICD-10-CM | POA: Diagnosis not present

## 2018-04-12 DIAGNOSIS — E785 Hyperlipidemia, unspecified: Secondary | ICD-10-CM | POA: Diagnosis not present

## 2018-04-12 DIAGNOSIS — Z9181 History of falling: Secondary | ICD-10-CM | POA: Diagnosis not present

## 2018-04-12 DIAGNOSIS — R2689 Other abnormalities of gait and mobility: Secondary | ICD-10-CM | POA: Diagnosis not present

## 2018-04-14 DIAGNOSIS — I5032 Chronic diastolic (congestive) heart failure: Secondary | ICD-10-CM | POA: Diagnosis not present

## 2018-04-14 DIAGNOSIS — R2689 Other abnormalities of gait and mobility: Secondary | ICD-10-CM | POA: Diagnosis not present

## 2018-04-14 DIAGNOSIS — M6281 Muscle weakness (generalized): Secondary | ICD-10-CM | POA: Diagnosis not present

## 2018-04-14 DIAGNOSIS — Z9181 History of falling: Secondary | ICD-10-CM | POA: Diagnosis not present

## 2018-04-14 DIAGNOSIS — E1159 Type 2 diabetes mellitus with other circulatory complications: Secondary | ICD-10-CM | POA: Diagnosis not present

## 2018-04-14 DIAGNOSIS — E785 Hyperlipidemia, unspecified: Secondary | ICD-10-CM | POA: Diagnosis not present

## 2018-04-14 DIAGNOSIS — R5381 Other malaise: Secondary | ICD-10-CM | POA: Diagnosis not present

## 2018-04-14 DIAGNOSIS — R270 Ataxia, unspecified: Secondary | ICD-10-CM | POA: Diagnosis not present

## 2018-04-14 DIAGNOSIS — G309 Alzheimer's disease, unspecified: Secondary | ICD-10-CM | POA: Diagnosis not present

## 2018-04-14 DIAGNOSIS — I480 Paroxysmal atrial fibrillation: Secondary | ICD-10-CM | POA: Diagnosis not present

## 2018-04-15 DIAGNOSIS — R270 Ataxia, unspecified: Secondary | ICD-10-CM | POA: Diagnosis not present

## 2018-04-15 DIAGNOSIS — Z9181 History of falling: Secondary | ICD-10-CM | POA: Diagnosis not present

## 2018-04-15 DIAGNOSIS — M6281 Muscle weakness (generalized): Secondary | ICD-10-CM | POA: Diagnosis not present

## 2018-04-15 DIAGNOSIS — R2689 Other abnormalities of gait and mobility: Secondary | ICD-10-CM | POA: Diagnosis not present

## 2018-04-15 DIAGNOSIS — E785 Hyperlipidemia, unspecified: Secondary | ICD-10-CM | POA: Diagnosis not present

## 2018-04-15 DIAGNOSIS — G309 Alzheimer's disease, unspecified: Secondary | ICD-10-CM | POA: Diagnosis not present

## 2018-04-15 DIAGNOSIS — I5032 Chronic diastolic (congestive) heart failure: Secondary | ICD-10-CM | POA: Diagnosis not present

## 2018-04-15 DIAGNOSIS — E1159 Type 2 diabetes mellitus with other circulatory complications: Secondary | ICD-10-CM | POA: Diagnosis not present

## 2018-04-15 DIAGNOSIS — I480 Paroxysmal atrial fibrillation: Secondary | ICD-10-CM | POA: Diagnosis not present

## 2018-04-16 DIAGNOSIS — Z9181 History of falling: Secondary | ICD-10-CM | POA: Diagnosis not present

## 2018-04-16 DIAGNOSIS — M6281 Muscle weakness (generalized): Secondary | ICD-10-CM | POA: Diagnosis not present

## 2018-04-16 DIAGNOSIS — R270 Ataxia, unspecified: Secondary | ICD-10-CM | POA: Diagnosis not present

## 2018-04-16 DIAGNOSIS — E785 Hyperlipidemia, unspecified: Secondary | ICD-10-CM | POA: Diagnosis not present

## 2018-04-16 DIAGNOSIS — I5032 Chronic diastolic (congestive) heart failure: Secondary | ICD-10-CM | POA: Diagnosis not present

## 2018-04-16 DIAGNOSIS — R2689 Other abnormalities of gait and mobility: Secondary | ICD-10-CM | POA: Diagnosis not present

## 2018-04-16 DIAGNOSIS — G309 Alzheimer's disease, unspecified: Secondary | ICD-10-CM | POA: Diagnosis not present

## 2018-04-16 DIAGNOSIS — E1159 Type 2 diabetes mellitus with other circulatory complications: Secondary | ICD-10-CM | POA: Diagnosis not present

## 2018-04-16 DIAGNOSIS — I480 Paroxysmal atrial fibrillation: Secondary | ICD-10-CM | POA: Diagnosis not present

## 2018-04-17 DIAGNOSIS — E1159 Type 2 diabetes mellitus with other circulatory complications: Secondary | ICD-10-CM | POA: Diagnosis not present

## 2018-04-17 DIAGNOSIS — E785 Hyperlipidemia, unspecified: Secondary | ICD-10-CM | POA: Diagnosis not present

## 2018-04-17 DIAGNOSIS — G309 Alzheimer's disease, unspecified: Secondary | ICD-10-CM | POA: Diagnosis not present

## 2018-04-17 DIAGNOSIS — R2689 Other abnormalities of gait and mobility: Secondary | ICD-10-CM | POA: Diagnosis not present

## 2018-04-17 DIAGNOSIS — Z9181 History of falling: Secondary | ICD-10-CM | POA: Diagnosis not present

## 2018-04-17 DIAGNOSIS — I480 Paroxysmal atrial fibrillation: Secondary | ICD-10-CM | POA: Diagnosis not present

## 2018-04-17 DIAGNOSIS — M6281 Muscle weakness (generalized): Secondary | ICD-10-CM | POA: Diagnosis not present

## 2018-04-17 DIAGNOSIS — R270 Ataxia, unspecified: Secondary | ICD-10-CM | POA: Diagnosis not present

## 2018-04-17 DIAGNOSIS — I5032 Chronic diastolic (congestive) heart failure: Secondary | ICD-10-CM | POA: Diagnosis not present

## 2018-04-18 DIAGNOSIS — E1159 Type 2 diabetes mellitus with other circulatory complications: Secondary | ICD-10-CM | POA: Diagnosis not present

## 2018-04-18 DIAGNOSIS — G309 Alzheimer's disease, unspecified: Secondary | ICD-10-CM | POA: Diagnosis not present

## 2018-04-18 DIAGNOSIS — M6281 Muscle weakness (generalized): Secondary | ICD-10-CM | POA: Diagnosis not present

## 2018-04-18 DIAGNOSIS — E785 Hyperlipidemia, unspecified: Secondary | ICD-10-CM | POA: Diagnosis not present

## 2018-04-18 DIAGNOSIS — R270 Ataxia, unspecified: Secondary | ICD-10-CM | POA: Diagnosis not present

## 2018-04-18 DIAGNOSIS — I480 Paroxysmal atrial fibrillation: Secondary | ICD-10-CM | POA: Diagnosis not present

## 2018-04-18 DIAGNOSIS — R2689 Other abnormalities of gait and mobility: Secondary | ICD-10-CM | POA: Diagnosis not present

## 2018-04-18 DIAGNOSIS — Z9181 History of falling: Secondary | ICD-10-CM | POA: Diagnosis not present

## 2018-04-18 DIAGNOSIS — I5032 Chronic diastolic (congestive) heart failure: Secondary | ICD-10-CM | POA: Diagnosis not present

## 2018-04-21 DIAGNOSIS — E785 Hyperlipidemia, unspecified: Secondary | ICD-10-CM | POA: Diagnosis not present

## 2018-04-21 DIAGNOSIS — Z9181 History of falling: Secondary | ICD-10-CM | POA: Diagnosis not present

## 2018-04-21 DIAGNOSIS — R2689 Other abnormalities of gait and mobility: Secondary | ICD-10-CM | POA: Diagnosis not present

## 2018-04-21 DIAGNOSIS — R270 Ataxia, unspecified: Secondary | ICD-10-CM | POA: Diagnosis not present

## 2018-04-21 DIAGNOSIS — M6281 Muscle weakness (generalized): Secondary | ICD-10-CM | POA: Diagnosis not present

## 2018-04-21 DIAGNOSIS — E1159 Type 2 diabetes mellitus with other circulatory complications: Secondary | ICD-10-CM | POA: Diagnosis not present

## 2018-04-21 DIAGNOSIS — I480 Paroxysmal atrial fibrillation: Secondary | ICD-10-CM | POA: Diagnosis not present

## 2018-04-21 DIAGNOSIS — G309 Alzheimer's disease, unspecified: Secondary | ICD-10-CM | POA: Diagnosis not present

## 2018-04-21 DIAGNOSIS — I5032 Chronic diastolic (congestive) heart failure: Secondary | ICD-10-CM | POA: Diagnosis not present

## 2018-04-22 DIAGNOSIS — I480 Paroxysmal atrial fibrillation: Secondary | ICD-10-CM | POA: Diagnosis not present

## 2018-04-22 DIAGNOSIS — E1159 Type 2 diabetes mellitus with other circulatory complications: Secondary | ICD-10-CM | POA: Diagnosis not present

## 2018-04-22 DIAGNOSIS — R270 Ataxia, unspecified: Secondary | ICD-10-CM | POA: Diagnosis not present

## 2018-04-22 DIAGNOSIS — M6281 Muscle weakness (generalized): Secondary | ICD-10-CM | POA: Diagnosis not present

## 2018-04-22 DIAGNOSIS — I5032 Chronic diastolic (congestive) heart failure: Secondary | ICD-10-CM | POA: Diagnosis not present

## 2018-04-22 DIAGNOSIS — R2689 Other abnormalities of gait and mobility: Secondary | ICD-10-CM | POA: Diagnosis not present

## 2018-04-22 DIAGNOSIS — Z9181 History of falling: Secondary | ICD-10-CM | POA: Diagnosis not present

## 2018-04-22 DIAGNOSIS — G309 Alzheimer's disease, unspecified: Secondary | ICD-10-CM | POA: Diagnosis not present

## 2018-04-22 DIAGNOSIS — E785 Hyperlipidemia, unspecified: Secondary | ICD-10-CM | POA: Diagnosis not present

## 2018-04-23 DIAGNOSIS — I5032 Chronic diastolic (congestive) heart failure: Secondary | ICD-10-CM | POA: Diagnosis not present

## 2018-04-23 DIAGNOSIS — E1159 Type 2 diabetes mellitus with other circulatory complications: Secondary | ICD-10-CM | POA: Diagnosis not present

## 2018-04-23 DIAGNOSIS — M6281 Muscle weakness (generalized): Secondary | ICD-10-CM | POA: Diagnosis not present

## 2018-04-23 DIAGNOSIS — G309 Alzheimer's disease, unspecified: Secondary | ICD-10-CM | POA: Diagnosis not present

## 2018-04-23 DIAGNOSIS — R270 Ataxia, unspecified: Secondary | ICD-10-CM | POA: Diagnosis not present

## 2018-04-23 DIAGNOSIS — I480 Paroxysmal atrial fibrillation: Secondary | ICD-10-CM | POA: Diagnosis not present

## 2018-04-23 DIAGNOSIS — Z9181 History of falling: Secondary | ICD-10-CM | POA: Diagnosis not present

## 2018-04-23 DIAGNOSIS — E785 Hyperlipidemia, unspecified: Secondary | ICD-10-CM | POA: Diagnosis not present

## 2018-04-23 DIAGNOSIS — R2689 Other abnormalities of gait and mobility: Secondary | ICD-10-CM | POA: Diagnosis not present

## 2018-04-24 ENCOUNTER — Ambulatory Visit: Payer: Medicare Other | Admitting: Internal Medicine

## 2018-04-24 DIAGNOSIS — I480 Paroxysmal atrial fibrillation: Secondary | ICD-10-CM | POA: Diagnosis not present

## 2018-04-24 DIAGNOSIS — E785 Hyperlipidemia, unspecified: Secondary | ICD-10-CM | POA: Diagnosis not present

## 2018-04-24 DIAGNOSIS — M6281 Muscle weakness (generalized): Secondary | ICD-10-CM | POA: Diagnosis not present

## 2018-04-24 DIAGNOSIS — R270 Ataxia, unspecified: Secondary | ICD-10-CM | POA: Diagnosis not present

## 2018-04-24 DIAGNOSIS — E1159 Type 2 diabetes mellitus with other circulatory complications: Secondary | ICD-10-CM | POA: Diagnosis not present

## 2018-04-24 DIAGNOSIS — Z9181 History of falling: Secondary | ICD-10-CM | POA: Diagnosis not present

## 2018-04-24 DIAGNOSIS — I5032 Chronic diastolic (congestive) heart failure: Secondary | ICD-10-CM | POA: Diagnosis not present

## 2018-04-24 DIAGNOSIS — G309 Alzheimer's disease, unspecified: Secondary | ICD-10-CM | POA: Diagnosis not present

## 2018-04-24 DIAGNOSIS — R2689 Other abnormalities of gait and mobility: Secondary | ICD-10-CM | POA: Diagnosis not present

## 2018-04-25 DIAGNOSIS — R270 Ataxia, unspecified: Secondary | ICD-10-CM | POA: Diagnosis not present

## 2018-04-25 DIAGNOSIS — E1159 Type 2 diabetes mellitus with other circulatory complications: Secondary | ICD-10-CM | POA: Diagnosis not present

## 2018-04-25 DIAGNOSIS — I480 Paroxysmal atrial fibrillation: Secondary | ICD-10-CM | POA: Diagnosis not present

## 2018-04-25 DIAGNOSIS — E785 Hyperlipidemia, unspecified: Secondary | ICD-10-CM | POA: Diagnosis not present

## 2018-04-25 DIAGNOSIS — R2689 Other abnormalities of gait and mobility: Secondary | ICD-10-CM | POA: Diagnosis not present

## 2018-04-25 DIAGNOSIS — M6281 Muscle weakness (generalized): Secondary | ICD-10-CM | POA: Diagnosis not present

## 2018-04-25 DIAGNOSIS — Z9181 History of falling: Secondary | ICD-10-CM | POA: Diagnosis not present

## 2018-04-25 DIAGNOSIS — I5032 Chronic diastolic (congestive) heart failure: Secondary | ICD-10-CM | POA: Diagnosis not present

## 2018-04-25 DIAGNOSIS — G309 Alzheimer's disease, unspecified: Secondary | ICD-10-CM | POA: Diagnosis not present

## 2018-04-28 DIAGNOSIS — E119 Type 2 diabetes mellitus without complications: Secondary | ICD-10-CM | POA: Diagnosis not present

## 2018-04-28 DIAGNOSIS — E7849 Other hyperlipidemia: Secondary | ICD-10-CM | POA: Diagnosis not present

## 2018-04-28 DIAGNOSIS — D508 Other iron deficiency anemias: Secondary | ICD-10-CM | POA: Diagnosis not present

## 2018-06-02 DIAGNOSIS — I480 Paroxysmal atrial fibrillation: Secondary | ICD-10-CM | POA: Diagnosis not present

## 2018-06-02 DIAGNOSIS — E119 Type 2 diabetes mellitus without complications: Secondary | ICD-10-CM | POA: Diagnosis not present

## 2018-06-02 DIAGNOSIS — G308 Other Alzheimer's disease: Secondary | ICD-10-CM | POA: Diagnosis not present

## 2018-06-02 DIAGNOSIS — I5032 Chronic diastolic (congestive) heart failure: Secondary | ICD-10-CM | POA: Diagnosis not present

## 2018-06-17 DIAGNOSIS — Z79899 Other long term (current) drug therapy: Secondary | ICD-10-CM | POA: Diagnosis not present

## 2018-06-17 DIAGNOSIS — E785 Hyperlipidemia, unspecified: Secondary | ICD-10-CM | POA: Diagnosis not present

## 2018-07-02 DIAGNOSIS — E7849 Other hyperlipidemia: Secondary | ICD-10-CM | POA: Diagnosis not present

## 2018-07-02 DIAGNOSIS — I10 Essential (primary) hypertension: Secondary | ICD-10-CM | POA: Diagnosis not present

## 2018-07-02 DIAGNOSIS — D508 Other iron deficiency anemias: Secondary | ICD-10-CM | POA: Diagnosis not present

## 2018-07-02 DIAGNOSIS — E119 Type 2 diabetes mellitus without complications: Secondary | ICD-10-CM | POA: Diagnosis not present

## 2018-07-02 DIAGNOSIS — I480 Paroxysmal atrial fibrillation: Secondary | ICD-10-CM | POA: Diagnosis not present

## 2018-07-08 DIAGNOSIS — R2689 Other abnormalities of gait and mobility: Secondary | ICD-10-CM | POA: Diagnosis not present

## 2018-07-08 DIAGNOSIS — I480 Paroxysmal atrial fibrillation: Secondary | ICD-10-CM | POA: Diagnosis not present

## 2018-07-08 DIAGNOSIS — E785 Hyperlipidemia, unspecified: Secondary | ICD-10-CM | POA: Diagnosis not present

## 2018-07-08 DIAGNOSIS — M6281 Muscle weakness (generalized): Secondary | ICD-10-CM | POA: Diagnosis not present

## 2018-07-08 DIAGNOSIS — R270 Ataxia, unspecified: Secondary | ICD-10-CM | POA: Diagnosis not present

## 2018-07-08 DIAGNOSIS — G309 Alzheimer's disease, unspecified: Secondary | ICD-10-CM | POA: Diagnosis not present

## 2018-07-08 DIAGNOSIS — Z9181 History of falling: Secondary | ICD-10-CM | POA: Diagnosis not present

## 2018-07-08 DIAGNOSIS — E1159 Type 2 diabetes mellitus with other circulatory complications: Secondary | ICD-10-CM | POA: Diagnosis not present

## 2018-07-08 DIAGNOSIS — I5032 Chronic diastolic (congestive) heart failure: Secondary | ICD-10-CM | POA: Diagnosis not present

## 2018-07-09 DIAGNOSIS — I480 Paroxysmal atrial fibrillation: Secondary | ICD-10-CM | POA: Diagnosis not present

## 2018-07-09 DIAGNOSIS — G309 Alzheimer's disease, unspecified: Secondary | ICD-10-CM | POA: Diagnosis not present

## 2018-07-09 DIAGNOSIS — R270 Ataxia, unspecified: Secondary | ICD-10-CM | POA: Diagnosis not present

## 2018-07-09 DIAGNOSIS — M6281 Muscle weakness (generalized): Secondary | ICD-10-CM | POA: Diagnosis not present

## 2018-07-09 DIAGNOSIS — R2689 Other abnormalities of gait and mobility: Secondary | ICD-10-CM | POA: Diagnosis not present

## 2018-07-09 DIAGNOSIS — Z9181 History of falling: Secondary | ICD-10-CM | POA: Diagnosis not present

## 2018-07-09 DIAGNOSIS — E785 Hyperlipidemia, unspecified: Secondary | ICD-10-CM | POA: Diagnosis not present

## 2018-07-09 DIAGNOSIS — E1159 Type 2 diabetes mellitus with other circulatory complications: Secondary | ICD-10-CM | POA: Diagnosis not present

## 2018-07-09 DIAGNOSIS — I5032 Chronic diastolic (congestive) heart failure: Secondary | ICD-10-CM | POA: Diagnosis not present

## 2018-07-10 DIAGNOSIS — I5032 Chronic diastolic (congestive) heart failure: Secondary | ICD-10-CM | POA: Diagnosis not present

## 2018-07-10 DIAGNOSIS — E1159 Type 2 diabetes mellitus with other circulatory complications: Secondary | ICD-10-CM | POA: Diagnosis not present

## 2018-07-10 DIAGNOSIS — Z9181 History of falling: Secondary | ICD-10-CM | POA: Diagnosis not present

## 2018-07-10 DIAGNOSIS — R270 Ataxia, unspecified: Secondary | ICD-10-CM | POA: Diagnosis not present

## 2018-07-10 DIAGNOSIS — E785 Hyperlipidemia, unspecified: Secondary | ICD-10-CM | POA: Diagnosis not present

## 2018-07-10 DIAGNOSIS — G309 Alzheimer's disease, unspecified: Secondary | ICD-10-CM | POA: Diagnosis not present

## 2018-07-10 DIAGNOSIS — I480 Paroxysmal atrial fibrillation: Secondary | ICD-10-CM | POA: Diagnosis not present

## 2018-07-10 DIAGNOSIS — D649 Anemia, unspecified: Secondary | ICD-10-CM | POA: Diagnosis not present

## 2018-07-10 DIAGNOSIS — M6281 Muscle weakness (generalized): Secondary | ICD-10-CM | POA: Diagnosis not present

## 2018-07-10 DIAGNOSIS — R2689 Other abnormalities of gait and mobility: Secondary | ICD-10-CM | POA: Diagnosis not present

## 2018-07-11 DIAGNOSIS — G309 Alzheimer's disease, unspecified: Secondary | ICD-10-CM | POA: Diagnosis not present

## 2018-07-11 DIAGNOSIS — I5032 Chronic diastolic (congestive) heart failure: Secondary | ICD-10-CM | POA: Diagnosis not present

## 2018-07-11 DIAGNOSIS — I480 Paroxysmal atrial fibrillation: Secondary | ICD-10-CM | POA: Diagnosis not present

## 2018-07-11 DIAGNOSIS — R270 Ataxia, unspecified: Secondary | ICD-10-CM | POA: Diagnosis not present

## 2018-07-11 DIAGNOSIS — E785 Hyperlipidemia, unspecified: Secondary | ICD-10-CM | POA: Diagnosis not present

## 2018-07-11 DIAGNOSIS — E1159 Type 2 diabetes mellitus with other circulatory complications: Secondary | ICD-10-CM | POA: Diagnosis not present

## 2018-07-11 DIAGNOSIS — R2689 Other abnormalities of gait and mobility: Secondary | ICD-10-CM | POA: Diagnosis not present

## 2018-07-11 DIAGNOSIS — Z9181 History of falling: Secondary | ICD-10-CM | POA: Diagnosis not present

## 2018-07-11 DIAGNOSIS — M6281 Muscle weakness (generalized): Secondary | ICD-10-CM | POA: Diagnosis not present

## 2018-07-14 DIAGNOSIS — R270 Ataxia, unspecified: Secondary | ICD-10-CM | POA: Diagnosis not present

## 2018-07-14 DIAGNOSIS — I480 Paroxysmal atrial fibrillation: Secondary | ICD-10-CM | POA: Diagnosis not present

## 2018-07-14 DIAGNOSIS — I5032 Chronic diastolic (congestive) heart failure: Secondary | ICD-10-CM | POA: Diagnosis not present

## 2018-07-14 DIAGNOSIS — G309 Alzheimer's disease, unspecified: Secondary | ICD-10-CM | POA: Diagnosis not present

## 2018-07-14 DIAGNOSIS — E785 Hyperlipidemia, unspecified: Secondary | ICD-10-CM | POA: Diagnosis not present

## 2018-07-14 DIAGNOSIS — Z9181 History of falling: Secondary | ICD-10-CM | POA: Diagnosis not present

## 2018-07-14 DIAGNOSIS — R2689 Other abnormalities of gait and mobility: Secondary | ICD-10-CM | POA: Diagnosis not present

## 2018-07-14 DIAGNOSIS — E1159 Type 2 diabetes mellitus with other circulatory complications: Secondary | ICD-10-CM | POA: Diagnosis not present

## 2018-07-14 DIAGNOSIS — M6281 Muscle weakness (generalized): Secondary | ICD-10-CM | POA: Diagnosis not present

## 2018-07-15 DIAGNOSIS — G309 Alzheimer's disease, unspecified: Secondary | ICD-10-CM | POA: Diagnosis not present

## 2018-07-15 DIAGNOSIS — R2689 Other abnormalities of gait and mobility: Secondary | ICD-10-CM | POA: Diagnosis not present

## 2018-07-15 DIAGNOSIS — E1159 Type 2 diabetes mellitus with other circulatory complications: Secondary | ICD-10-CM | POA: Diagnosis not present

## 2018-07-15 DIAGNOSIS — I5032 Chronic diastolic (congestive) heart failure: Secondary | ICD-10-CM | POA: Diagnosis not present

## 2018-07-15 DIAGNOSIS — I480 Paroxysmal atrial fibrillation: Secondary | ICD-10-CM | POA: Diagnosis not present

## 2018-07-15 DIAGNOSIS — Z9181 History of falling: Secondary | ICD-10-CM | POA: Diagnosis not present

## 2018-07-15 DIAGNOSIS — M6281 Muscle weakness (generalized): Secondary | ICD-10-CM | POA: Diagnosis not present

## 2018-07-15 DIAGNOSIS — E785 Hyperlipidemia, unspecified: Secondary | ICD-10-CM | POA: Diagnosis not present

## 2018-07-15 DIAGNOSIS — R270 Ataxia, unspecified: Secondary | ICD-10-CM | POA: Diagnosis not present

## 2018-07-16 DIAGNOSIS — M6281 Muscle weakness (generalized): Secondary | ICD-10-CM | POA: Diagnosis not present

## 2018-07-16 DIAGNOSIS — I480 Paroxysmal atrial fibrillation: Secondary | ICD-10-CM | POA: Diagnosis not present

## 2018-07-16 DIAGNOSIS — G309 Alzheimer's disease, unspecified: Secondary | ICD-10-CM | POA: Diagnosis not present

## 2018-07-16 DIAGNOSIS — E1159 Type 2 diabetes mellitus with other circulatory complications: Secondary | ICD-10-CM | POA: Diagnosis not present

## 2018-07-16 DIAGNOSIS — R270 Ataxia, unspecified: Secondary | ICD-10-CM | POA: Diagnosis not present

## 2018-07-16 DIAGNOSIS — I5032 Chronic diastolic (congestive) heart failure: Secondary | ICD-10-CM | POA: Diagnosis not present

## 2018-07-16 DIAGNOSIS — E785 Hyperlipidemia, unspecified: Secondary | ICD-10-CM | POA: Diagnosis not present

## 2018-07-16 DIAGNOSIS — Z9181 History of falling: Secondary | ICD-10-CM | POA: Diagnosis not present

## 2018-07-16 DIAGNOSIS — R2689 Other abnormalities of gait and mobility: Secondary | ICD-10-CM | POA: Diagnosis not present

## 2018-07-17 DIAGNOSIS — E785 Hyperlipidemia, unspecified: Secondary | ICD-10-CM | POA: Diagnosis not present

## 2018-07-17 DIAGNOSIS — R270 Ataxia, unspecified: Secondary | ICD-10-CM | POA: Diagnosis not present

## 2018-07-17 DIAGNOSIS — I5032 Chronic diastolic (congestive) heart failure: Secondary | ICD-10-CM | POA: Diagnosis not present

## 2018-07-17 DIAGNOSIS — G309 Alzheimer's disease, unspecified: Secondary | ICD-10-CM | POA: Diagnosis not present

## 2018-07-17 DIAGNOSIS — E1159 Type 2 diabetes mellitus with other circulatory complications: Secondary | ICD-10-CM | POA: Diagnosis not present

## 2018-07-17 DIAGNOSIS — Z9181 History of falling: Secondary | ICD-10-CM | POA: Diagnosis not present

## 2018-07-17 DIAGNOSIS — M6281 Muscle weakness (generalized): Secondary | ICD-10-CM | POA: Diagnosis not present

## 2018-07-17 DIAGNOSIS — I480 Paroxysmal atrial fibrillation: Secondary | ICD-10-CM | POA: Diagnosis not present

## 2018-07-17 DIAGNOSIS — R2689 Other abnormalities of gait and mobility: Secondary | ICD-10-CM | POA: Diagnosis not present

## 2018-07-18 DIAGNOSIS — E785 Hyperlipidemia, unspecified: Secondary | ICD-10-CM | POA: Diagnosis not present

## 2018-07-18 DIAGNOSIS — M6281 Muscle weakness (generalized): Secondary | ICD-10-CM | POA: Diagnosis not present

## 2018-07-18 DIAGNOSIS — R2689 Other abnormalities of gait and mobility: Secondary | ICD-10-CM | POA: Diagnosis not present

## 2018-07-18 DIAGNOSIS — E1159 Type 2 diabetes mellitus with other circulatory complications: Secondary | ICD-10-CM | POA: Diagnosis not present

## 2018-07-18 DIAGNOSIS — G309 Alzheimer's disease, unspecified: Secondary | ICD-10-CM | POA: Diagnosis not present

## 2018-07-18 DIAGNOSIS — Z9181 History of falling: Secondary | ICD-10-CM | POA: Diagnosis not present

## 2018-07-18 DIAGNOSIS — R270 Ataxia, unspecified: Secondary | ICD-10-CM | POA: Diagnosis not present

## 2018-07-18 DIAGNOSIS — I480 Paroxysmal atrial fibrillation: Secondary | ICD-10-CM | POA: Diagnosis not present

## 2018-07-18 DIAGNOSIS — I5032 Chronic diastolic (congestive) heart failure: Secondary | ICD-10-CM | POA: Diagnosis not present

## 2018-07-21 DIAGNOSIS — I5032 Chronic diastolic (congestive) heart failure: Secondary | ICD-10-CM | POA: Diagnosis not present

## 2018-07-21 DIAGNOSIS — I480 Paroxysmal atrial fibrillation: Secondary | ICD-10-CM | POA: Diagnosis not present

## 2018-07-21 DIAGNOSIS — E785 Hyperlipidemia, unspecified: Secondary | ICD-10-CM | POA: Diagnosis not present

## 2018-07-21 DIAGNOSIS — M6281 Muscle weakness (generalized): Secondary | ICD-10-CM | POA: Diagnosis not present

## 2018-07-21 DIAGNOSIS — G309 Alzheimer's disease, unspecified: Secondary | ICD-10-CM | POA: Diagnosis not present

## 2018-07-21 DIAGNOSIS — Z9181 History of falling: Secondary | ICD-10-CM | POA: Diagnosis not present

## 2018-07-21 DIAGNOSIS — R2689 Other abnormalities of gait and mobility: Secondary | ICD-10-CM | POA: Diagnosis not present

## 2018-07-21 DIAGNOSIS — R270 Ataxia, unspecified: Secondary | ICD-10-CM | POA: Diagnosis not present

## 2018-07-21 DIAGNOSIS — E1159 Type 2 diabetes mellitus with other circulatory complications: Secondary | ICD-10-CM | POA: Diagnosis not present

## 2018-07-22 DIAGNOSIS — E1159 Type 2 diabetes mellitus with other circulatory complications: Secondary | ICD-10-CM | POA: Diagnosis not present

## 2018-07-22 DIAGNOSIS — E785 Hyperlipidemia, unspecified: Secondary | ICD-10-CM | POA: Diagnosis not present

## 2018-07-22 DIAGNOSIS — R2689 Other abnormalities of gait and mobility: Secondary | ICD-10-CM | POA: Diagnosis not present

## 2018-07-22 DIAGNOSIS — I5032 Chronic diastolic (congestive) heart failure: Secondary | ICD-10-CM | POA: Diagnosis not present

## 2018-07-22 DIAGNOSIS — M6281 Muscle weakness (generalized): Secondary | ICD-10-CM | POA: Diagnosis not present

## 2018-07-22 DIAGNOSIS — Z9181 History of falling: Secondary | ICD-10-CM | POA: Diagnosis not present

## 2018-07-22 DIAGNOSIS — G309 Alzheimer's disease, unspecified: Secondary | ICD-10-CM | POA: Diagnosis not present

## 2018-07-22 DIAGNOSIS — R270 Ataxia, unspecified: Secondary | ICD-10-CM | POA: Diagnosis not present

## 2018-07-22 DIAGNOSIS — I480 Paroxysmal atrial fibrillation: Secondary | ICD-10-CM | POA: Diagnosis not present

## 2018-07-23 DIAGNOSIS — R2689 Other abnormalities of gait and mobility: Secondary | ICD-10-CM | POA: Diagnosis not present

## 2018-07-23 DIAGNOSIS — I5032 Chronic diastolic (congestive) heart failure: Secondary | ICD-10-CM | POA: Diagnosis not present

## 2018-07-23 DIAGNOSIS — Z9181 History of falling: Secondary | ICD-10-CM | POA: Diagnosis not present

## 2018-07-23 DIAGNOSIS — R270 Ataxia, unspecified: Secondary | ICD-10-CM | POA: Diagnosis not present

## 2018-07-23 DIAGNOSIS — I480 Paroxysmal atrial fibrillation: Secondary | ICD-10-CM | POA: Diagnosis not present

## 2018-07-23 DIAGNOSIS — M6281 Muscle weakness (generalized): Secondary | ICD-10-CM | POA: Diagnosis not present

## 2018-07-23 DIAGNOSIS — G309 Alzheimer's disease, unspecified: Secondary | ICD-10-CM | POA: Diagnosis not present

## 2018-07-23 DIAGNOSIS — E1159 Type 2 diabetes mellitus with other circulatory complications: Secondary | ICD-10-CM | POA: Diagnosis not present

## 2018-07-23 DIAGNOSIS — E785 Hyperlipidemia, unspecified: Secondary | ICD-10-CM | POA: Diagnosis not present

## 2018-07-24 DIAGNOSIS — D649 Anemia, unspecified: Secondary | ICD-10-CM | POA: Diagnosis not present

## 2018-07-24 DIAGNOSIS — G309 Alzheimer's disease, unspecified: Secondary | ICD-10-CM | POA: Diagnosis not present

## 2018-07-24 DIAGNOSIS — I480 Paroxysmal atrial fibrillation: Secondary | ICD-10-CM | POA: Diagnosis not present

## 2018-07-24 DIAGNOSIS — E785 Hyperlipidemia, unspecified: Secondary | ICD-10-CM | POA: Diagnosis not present

## 2018-07-24 DIAGNOSIS — R270 Ataxia, unspecified: Secondary | ICD-10-CM | POA: Diagnosis not present

## 2018-07-24 DIAGNOSIS — E559 Vitamin D deficiency, unspecified: Secondary | ICD-10-CM | POA: Diagnosis not present

## 2018-07-24 DIAGNOSIS — I5032 Chronic diastolic (congestive) heart failure: Secondary | ICD-10-CM | POA: Diagnosis not present

## 2018-07-24 DIAGNOSIS — E1159 Type 2 diabetes mellitus with other circulatory complications: Secondary | ICD-10-CM | POA: Diagnosis not present

## 2018-07-24 DIAGNOSIS — R2689 Other abnormalities of gait and mobility: Secondary | ICD-10-CM | POA: Diagnosis not present

## 2018-07-24 DIAGNOSIS — M6281 Muscle weakness (generalized): Secondary | ICD-10-CM | POA: Diagnosis not present

## 2018-07-24 DIAGNOSIS — Z9181 History of falling: Secondary | ICD-10-CM | POA: Diagnosis not present

## 2018-07-25 DIAGNOSIS — E1159 Type 2 diabetes mellitus with other circulatory complications: Secondary | ICD-10-CM | POA: Diagnosis not present

## 2018-07-25 DIAGNOSIS — G309 Alzheimer's disease, unspecified: Secondary | ICD-10-CM | POA: Diagnosis not present

## 2018-07-25 DIAGNOSIS — E785 Hyperlipidemia, unspecified: Secondary | ICD-10-CM | POA: Diagnosis not present

## 2018-07-25 DIAGNOSIS — I480 Paroxysmal atrial fibrillation: Secondary | ICD-10-CM | POA: Diagnosis not present

## 2018-07-25 DIAGNOSIS — Z9181 History of falling: Secondary | ICD-10-CM | POA: Diagnosis not present

## 2018-07-25 DIAGNOSIS — R2689 Other abnormalities of gait and mobility: Secondary | ICD-10-CM | POA: Diagnosis not present

## 2018-07-25 DIAGNOSIS — I5032 Chronic diastolic (congestive) heart failure: Secondary | ICD-10-CM | POA: Diagnosis not present

## 2018-07-25 DIAGNOSIS — R270 Ataxia, unspecified: Secondary | ICD-10-CM | POA: Diagnosis not present

## 2018-07-25 DIAGNOSIS — M6281 Muscle weakness (generalized): Secondary | ICD-10-CM | POA: Diagnosis not present

## 2018-07-26 DIAGNOSIS — R2689 Other abnormalities of gait and mobility: Secondary | ICD-10-CM | POA: Diagnosis not present

## 2018-07-26 DIAGNOSIS — M6281 Muscle weakness (generalized): Secondary | ICD-10-CM | POA: Diagnosis not present

## 2018-07-26 DIAGNOSIS — E785 Hyperlipidemia, unspecified: Secondary | ICD-10-CM | POA: Diagnosis not present

## 2018-07-26 DIAGNOSIS — I480 Paroxysmal atrial fibrillation: Secondary | ICD-10-CM | POA: Diagnosis not present

## 2018-07-26 DIAGNOSIS — R270 Ataxia, unspecified: Secondary | ICD-10-CM | POA: Diagnosis not present

## 2018-07-26 DIAGNOSIS — E1159 Type 2 diabetes mellitus with other circulatory complications: Secondary | ICD-10-CM | POA: Diagnosis not present

## 2018-07-26 DIAGNOSIS — Z9181 History of falling: Secondary | ICD-10-CM | POA: Diagnosis not present

## 2018-07-26 DIAGNOSIS — G309 Alzheimer's disease, unspecified: Secondary | ICD-10-CM | POA: Diagnosis not present

## 2018-07-26 DIAGNOSIS — I5032 Chronic diastolic (congestive) heart failure: Secondary | ICD-10-CM | POA: Diagnosis not present

## 2018-07-29 DIAGNOSIS — E785 Hyperlipidemia, unspecified: Secondary | ICD-10-CM | POA: Diagnosis not present

## 2018-07-29 DIAGNOSIS — Z79899 Other long term (current) drug therapy: Secondary | ICD-10-CM | POA: Diagnosis not present

## 2018-07-30 DIAGNOSIS — I10 Essential (primary) hypertension: Secondary | ICD-10-CM | POA: Diagnosis not present

## 2018-07-30 DIAGNOSIS — E119 Type 2 diabetes mellitus without complications: Secondary | ICD-10-CM | POA: Diagnosis not present

## 2018-07-30 DIAGNOSIS — I480 Paroxysmal atrial fibrillation: Secondary | ICD-10-CM | POA: Diagnosis not present

## 2018-07-30 DIAGNOSIS — G308 Other Alzheimer's disease: Secondary | ICD-10-CM | POA: Diagnosis not present

## 2018-08-13 DIAGNOSIS — R52 Pain, unspecified: Secondary | ICD-10-CM | POA: Diagnosis not present

## 2018-08-13 DIAGNOSIS — M25551 Pain in right hip: Secondary | ICD-10-CM | POA: Diagnosis not present

## 2018-08-13 DIAGNOSIS — S73004A Unspecified dislocation of right hip, initial encounter: Secondary | ICD-10-CM | POA: Diagnosis not present

## 2018-08-13 DIAGNOSIS — Y998 Other external cause status: Secondary | ICD-10-CM | POA: Diagnosis not present

## 2018-08-13 DIAGNOSIS — R0902 Hypoxemia: Secondary | ICD-10-CM | POA: Diagnosis not present

## 2018-08-13 DIAGNOSIS — Z96641 Presence of right artificial hip joint: Secondary | ICD-10-CM | POA: Diagnosis not present

## 2018-08-13 DIAGNOSIS — T84020A Dislocation of internal right hip prosthesis, initial encounter: Secondary | ICD-10-CM | POA: Diagnosis not present

## 2018-08-13 DIAGNOSIS — Z96643 Presence of artificial hip joint, bilateral: Secondary | ICD-10-CM | POA: Diagnosis not present

## 2018-08-13 DIAGNOSIS — X500XXA Overexertion from strenuous movement or load, initial encounter: Secondary | ICD-10-CM | POA: Diagnosis not present

## 2018-08-13 DIAGNOSIS — R001 Bradycardia, unspecified: Secondary | ICD-10-CM | POA: Diagnosis not present

## 2018-08-14 DIAGNOSIS — I959 Hypotension, unspecified: Secondary | ICD-10-CM | POA: Diagnosis not present

## 2018-08-14 DIAGNOSIS — T84020A Dislocation of internal right hip prosthesis, initial encounter: Secondary | ICD-10-CM | POA: Diagnosis not present

## 2018-08-14 DIAGNOSIS — Z7401 Bed confinement status: Secondary | ICD-10-CM | POA: Diagnosis not present

## 2018-08-14 DIAGNOSIS — M2550 Pain in unspecified joint: Secondary | ICD-10-CM | POA: Diagnosis not present

## 2018-08-15 DIAGNOSIS — I480 Paroxysmal atrial fibrillation: Secondary | ICD-10-CM | POA: Diagnosis not present

## 2018-08-15 DIAGNOSIS — S72001A Fracture of unspecified part of neck of right femur, initial encounter for closed fracture: Secondary | ICD-10-CM | POA: Diagnosis not present

## 2018-08-21 DIAGNOSIS — E119 Type 2 diabetes mellitus without complications: Secondary | ICD-10-CM | POA: Diagnosis not present

## 2018-08-21 DIAGNOSIS — G308 Other Alzheimer's disease: Secondary | ICD-10-CM | POA: Diagnosis not present

## 2018-08-25 DIAGNOSIS — I480 Paroxysmal atrial fibrillation: Secondary | ICD-10-CM | POA: Diagnosis not present

## 2018-08-25 DIAGNOSIS — E119 Type 2 diabetes mellitus without complications: Secondary | ICD-10-CM | POA: Diagnosis not present

## 2018-08-25 DIAGNOSIS — G308 Other Alzheimer's disease: Secondary | ICD-10-CM | POA: Diagnosis not present

## 2018-08-25 DIAGNOSIS — I10 Essential (primary) hypertension: Secondary | ICD-10-CM | POA: Diagnosis not present

## 2018-08-25 DIAGNOSIS — I5032 Chronic diastolic (congestive) heart failure: Secondary | ICD-10-CM | POA: Diagnosis not present

## 2018-08-26 DIAGNOSIS — E119 Type 2 diabetes mellitus without complications: Secondary | ICD-10-CM | POA: Diagnosis not present

## 2018-08-26 DIAGNOSIS — I5032 Chronic diastolic (congestive) heart failure: Secondary | ICD-10-CM | POA: Diagnosis not present

## 2018-08-26 DIAGNOSIS — D649 Anemia, unspecified: Secondary | ICD-10-CM | POA: Diagnosis not present

## 2018-08-26 DIAGNOSIS — E785 Hyperlipidemia, unspecified: Secondary | ICD-10-CM | POA: Diagnosis not present

## 2018-08-26 DIAGNOSIS — E559 Vitamin D deficiency, unspecified: Secondary | ICD-10-CM | POA: Diagnosis not present

## 2018-09-02 DIAGNOSIS — Z96642 Presence of left artificial hip joint: Secondary | ICD-10-CM | POA: Diagnosis not present

## 2018-09-02 DIAGNOSIS — M25552 Pain in left hip: Secondary | ICD-10-CM | POA: Diagnosis not present

## 2018-09-04 DIAGNOSIS — M6281 Muscle weakness (generalized): Secondary | ICD-10-CM | POA: Diagnosis not present

## 2018-09-04 DIAGNOSIS — R278 Other lack of coordination: Secondary | ICD-10-CM | POA: Diagnosis not present

## 2018-09-04 DIAGNOSIS — G308 Other Alzheimer's disease: Secondary | ICD-10-CM | POA: Diagnosis not present

## 2018-09-04 DIAGNOSIS — Z9181 History of falling: Secondary | ICD-10-CM | POA: Diagnosis not present

## 2018-09-04 DIAGNOSIS — R2689 Other abnormalities of gait and mobility: Secondary | ICD-10-CM | POA: Diagnosis not present

## 2018-09-05 DIAGNOSIS — M6281 Muscle weakness (generalized): Secondary | ICD-10-CM | POA: Diagnosis not present

## 2018-09-05 DIAGNOSIS — R2689 Other abnormalities of gait and mobility: Secondary | ICD-10-CM | POA: Diagnosis not present

## 2018-09-05 DIAGNOSIS — R278 Other lack of coordination: Secondary | ICD-10-CM | POA: Diagnosis not present

## 2018-09-05 DIAGNOSIS — Z9181 History of falling: Secondary | ICD-10-CM | POA: Diagnosis not present

## 2018-09-06 DIAGNOSIS — R278 Other lack of coordination: Secondary | ICD-10-CM | POA: Diagnosis not present

## 2018-09-06 DIAGNOSIS — Z9181 History of falling: Secondary | ICD-10-CM | POA: Diagnosis not present

## 2018-09-06 DIAGNOSIS — R2689 Other abnormalities of gait and mobility: Secondary | ICD-10-CM | POA: Diagnosis not present

## 2018-09-06 DIAGNOSIS — M6281 Muscle weakness (generalized): Secondary | ICD-10-CM | POA: Diagnosis not present

## 2018-09-09 DIAGNOSIS — R2689 Other abnormalities of gait and mobility: Secondary | ICD-10-CM | POA: Diagnosis not present

## 2018-09-09 DIAGNOSIS — Z9181 History of falling: Secondary | ICD-10-CM | POA: Diagnosis not present

## 2018-09-09 DIAGNOSIS — R278 Other lack of coordination: Secondary | ICD-10-CM | POA: Diagnosis not present

## 2018-09-09 DIAGNOSIS — M6281 Muscle weakness (generalized): Secondary | ICD-10-CM | POA: Diagnosis not present

## 2018-09-10 DIAGNOSIS — R2689 Other abnormalities of gait and mobility: Secondary | ICD-10-CM | POA: Diagnosis not present

## 2018-09-10 DIAGNOSIS — Z9181 History of falling: Secondary | ICD-10-CM | POA: Diagnosis not present

## 2018-09-10 DIAGNOSIS — R278 Other lack of coordination: Secondary | ICD-10-CM | POA: Diagnosis not present

## 2018-09-10 DIAGNOSIS — M6281 Muscle weakness (generalized): Secondary | ICD-10-CM | POA: Diagnosis not present

## 2018-09-11 DIAGNOSIS — Z9181 History of falling: Secondary | ICD-10-CM | POA: Diagnosis not present

## 2018-09-11 DIAGNOSIS — M6281 Muscle weakness (generalized): Secondary | ICD-10-CM | POA: Diagnosis not present

## 2018-09-11 DIAGNOSIS — R278 Other lack of coordination: Secondary | ICD-10-CM | POA: Diagnosis not present

## 2018-09-11 DIAGNOSIS — R2689 Other abnormalities of gait and mobility: Secondary | ICD-10-CM | POA: Diagnosis not present

## 2018-09-12 DIAGNOSIS — M6281 Muscle weakness (generalized): Secondary | ICD-10-CM | POA: Diagnosis not present

## 2018-09-12 DIAGNOSIS — R2689 Other abnormalities of gait and mobility: Secondary | ICD-10-CM | POA: Diagnosis not present

## 2018-09-12 DIAGNOSIS — R278 Other lack of coordination: Secondary | ICD-10-CM | POA: Diagnosis not present

## 2018-09-12 DIAGNOSIS — Z9181 History of falling: Secondary | ICD-10-CM | POA: Diagnosis not present

## 2018-09-15 DIAGNOSIS — R2689 Other abnormalities of gait and mobility: Secondary | ICD-10-CM | POA: Diagnosis not present

## 2018-09-15 DIAGNOSIS — R278 Other lack of coordination: Secondary | ICD-10-CM | POA: Diagnosis not present

## 2018-09-15 DIAGNOSIS — Z9181 History of falling: Secondary | ICD-10-CM | POA: Diagnosis not present

## 2018-09-15 DIAGNOSIS — K219 Gastro-esophageal reflux disease without esophagitis: Secondary | ICD-10-CM | POA: Diagnosis not present

## 2018-09-15 DIAGNOSIS — M6281 Muscle weakness (generalized): Secondary | ICD-10-CM | POA: Diagnosis not present

## 2018-09-16 DIAGNOSIS — M6281 Muscle weakness (generalized): Secondary | ICD-10-CM | POA: Diagnosis not present

## 2018-09-16 DIAGNOSIS — R278 Other lack of coordination: Secondary | ICD-10-CM | POA: Diagnosis not present

## 2018-09-16 DIAGNOSIS — R2689 Other abnormalities of gait and mobility: Secondary | ICD-10-CM | POA: Diagnosis not present

## 2018-09-16 DIAGNOSIS — Z9181 History of falling: Secondary | ICD-10-CM | POA: Diagnosis not present

## 2018-09-17 DIAGNOSIS — M6281 Muscle weakness (generalized): Secondary | ICD-10-CM | POA: Diagnosis not present

## 2018-09-17 DIAGNOSIS — R278 Other lack of coordination: Secondary | ICD-10-CM | POA: Diagnosis not present

## 2018-09-17 DIAGNOSIS — Z9181 History of falling: Secondary | ICD-10-CM | POA: Diagnosis not present

## 2018-09-17 DIAGNOSIS — R2689 Other abnormalities of gait and mobility: Secondary | ICD-10-CM | POA: Diagnosis not present

## 2018-09-18 DIAGNOSIS — R2689 Other abnormalities of gait and mobility: Secondary | ICD-10-CM | POA: Diagnosis not present

## 2018-09-18 DIAGNOSIS — R278 Other lack of coordination: Secondary | ICD-10-CM | POA: Diagnosis not present

## 2018-09-18 DIAGNOSIS — M6281 Muscle weakness (generalized): Secondary | ICD-10-CM | POA: Diagnosis not present

## 2018-09-18 DIAGNOSIS — Z9181 History of falling: Secondary | ICD-10-CM | POA: Diagnosis not present

## 2018-09-19 DIAGNOSIS — M6281 Muscle weakness (generalized): Secondary | ICD-10-CM | POA: Diagnosis not present

## 2018-09-19 DIAGNOSIS — Z9181 History of falling: Secondary | ICD-10-CM | POA: Diagnosis not present

## 2018-09-19 DIAGNOSIS — R278 Other lack of coordination: Secondary | ICD-10-CM | POA: Diagnosis not present

## 2018-09-19 DIAGNOSIS — R2689 Other abnormalities of gait and mobility: Secondary | ICD-10-CM | POA: Diagnosis not present

## 2018-09-22 DIAGNOSIS — E119 Type 2 diabetes mellitus without complications: Secondary | ICD-10-CM | POA: Diagnosis not present

## 2018-09-22 DIAGNOSIS — G308 Other Alzheimer's disease: Secondary | ICD-10-CM | POA: Diagnosis not present

## 2018-09-22 DIAGNOSIS — I10 Essential (primary) hypertension: Secondary | ICD-10-CM | POA: Diagnosis not present

## 2018-09-22 DIAGNOSIS — M6281 Muscle weakness (generalized): Secondary | ICD-10-CM | POA: Diagnosis not present

## 2018-09-22 DIAGNOSIS — Z9181 History of falling: Secondary | ICD-10-CM | POA: Diagnosis not present

## 2018-09-22 DIAGNOSIS — R2689 Other abnormalities of gait and mobility: Secondary | ICD-10-CM | POA: Diagnosis not present

## 2018-09-22 DIAGNOSIS — R278 Other lack of coordination: Secondary | ICD-10-CM | POA: Diagnosis not present

## 2018-09-23 DIAGNOSIS — R278 Other lack of coordination: Secondary | ICD-10-CM | POA: Diagnosis not present

## 2018-09-23 DIAGNOSIS — Z9181 History of falling: Secondary | ICD-10-CM | POA: Diagnosis not present

## 2018-09-23 DIAGNOSIS — M6281 Muscle weakness (generalized): Secondary | ICD-10-CM | POA: Diagnosis not present

## 2018-09-23 DIAGNOSIS — R2689 Other abnormalities of gait and mobility: Secondary | ICD-10-CM | POA: Diagnosis not present

## 2018-09-24 DIAGNOSIS — R2689 Other abnormalities of gait and mobility: Secondary | ICD-10-CM | POA: Diagnosis not present

## 2018-09-24 DIAGNOSIS — R278 Other lack of coordination: Secondary | ICD-10-CM | POA: Diagnosis not present

## 2018-09-24 DIAGNOSIS — Z9181 History of falling: Secondary | ICD-10-CM | POA: Diagnosis not present

## 2018-09-24 DIAGNOSIS — M6281 Muscle weakness (generalized): Secondary | ICD-10-CM | POA: Diagnosis not present

## 2018-09-25 DIAGNOSIS — M6281 Muscle weakness (generalized): Secondary | ICD-10-CM | POA: Diagnosis not present

## 2018-09-25 DIAGNOSIS — R2689 Other abnormalities of gait and mobility: Secondary | ICD-10-CM | POA: Diagnosis not present

## 2018-09-25 DIAGNOSIS — R278 Other lack of coordination: Secondary | ICD-10-CM | POA: Diagnosis not present

## 2018-09-25 DIAGNOSIS — Z9181 History of falling: Secondary | ICD-10-CM | POA: Diagnosis not present

## 2018-09-27 DIAGNOSIS — R278 Other lack of coordination: Secondary | ICD-10-CM | POA: Diagnosis not present

## 2018-09-27 DIAGNOSIS — Z9181 History of falling: Secondary | ICD-10-CM | POA: Diagnosis not present

## 2018-09-27 DIAGNOSIS — M6281 Muscle weakness (generalized): Secondary | ICD-10-CM | POA: Diagnosis not present

## 2018-09-27 DIAGNOSIS — R2689 Other abnormalities of gait and mobility: Secondary | ICD-10-CM | POA: Diagnosis not present

## 2018-09-29 DIAGNOSIS — Z9181 History of falling: Secondary | ICD-10-CM | POA: Diagnosis not present

## 2018-09-29 DIAGNOSIS — R278 Other lack of coordination: Secondary | ICD-10-CM | POA: Diagnosis not present

## 2018-09-29 DIAGNOSIS — M6281 Muscle weakness (generalized): Secondary | ICD-10-CM | POA: Diagnosis not present

## 2018-09-29 DIAGNOSIS — R2689 Other abnormalities of gait and mobility: Secondary | ICD-10-CM | POA: Diagnosis not present

## 2018-09-30 DIAGNOSIS — R278 Other lack of coordination: Secondary | ICD-10-CM | POA: Diagnosis not present

## 2018-09-30 DIAGNOSIS — M6281 Muscle weakness (generalized): Secondary | ICD-10-CM | POA: Diagnosis not present

## 2018-09-30 DIAGNOSIS — Z9181 History of falling: Secondary | ICD-10-CM | POA: Diagnosis not present

## 2018-09-30 DIAGNOSIS — R2689 Other abnormalities of gait and mobility: Secondary | ICD-10-CM | POA: Diagnosis not present

## 2018-10-20 DIAGNOSIS — I10 Essential (primary) hypertension: Secondary | ICD-10-CM | POA: Diagnosis not present

## 2018-10-20 DIAGNOSIS — G308 Other Alzheimer's disease: Secondary | ICD-10-CM | POA: Diagnosis not present

## 2018-10-30 DIAGNOSIS — D508 Other iron deficiency anemias: Secondary | ICD-10-CM | POA: Diagnosis not present

## 2018-10-30 DIAGNOSIS — E7849 Other hyperlipidemia: Secondary | ICD-10-CM | POA: Diagnosis not present

## 2018-10-30 DIAGNOSIS — I480 Paroxysmal atrial fibrillation: Secondary | ICD-10-CM | POA: Diagnosis not present

## 2018-10-30 DIAGNOSIS — K219 Gastro-esophageal reflux disease without esophagitis: Secondary | ICD-10-CM | POA: Diagnosis not present

## 2018-11-20 DIAGNOSIS — E119 Type 2 diabetes mellitus without complications: Secondary | ICD-10-CM | POA: Diagnosis not present

## 2018-11-20 DIAGNOSIS — I10 Essential (primary) hypertension: Secondary | ICD-10-CM | POA: Diagnosis not present

## 2018-11-20 DIAGNOSIS — G308 Other Alzheimer's disease: Secondary | ICD-10-CM | POA: Diagnosis not present

## 2018-11-25 DIAGNOSIS — E1159 Type 2 diabetes mellitus with other circulatory complications: Secondary | ICD-10-CM | POA: Diagnosis not present

## 2018-12-10 DIAGNOSIS — I10 Essential (primary) hypertension: Secondary | ICD-10-CM | POA: Diagnosis not present

## 2018-12-10 DIAGNOSIS — D508 Other iron deficiency anemias: Secondary | ICD-10-CM | POA: Diagnosis not present

## 2018-12-16 DIAGNOSIS — I10 Essential (primary) hypertension: Secondary | ICD-10-CM | POA: Diagnosis not present

## 2018-12-22 DIAGNOSIS — I10 Essential (primary) hypertension: Secondary | ICD-10-CM | POA: Diagnosis not present

## 2018-12-22 DIAGNOSIS — G308 Other Alzheimer's disease: Secondary | ICD-10-CM | POA: Diagnosis not present

## 2018-12-22 DIAGNOSIS — E119 Type 2 diabetes mellitus without complications: Secondary | ICD-10-CM | POA: Diagnosis not present

## 2018-12-29 DIAGNOSIS — I5032 Chronic diastolic (congestive) heart failure: Secondary | ICD-10-CM | POA: Diagnosis not present

## 2018-12-29 DIAGNOSIS — I10 Essential (primary) hypertension: Secondary | ICD-10-CM | POA: Diagnosis not present

## 2018-12-29 DIAGNOSIS — I480 Paroxysmal atrial fibrillation: Secondary | ICD-10-CM | POA: Diagnosis not present

## 2018-12-29 DIAGNOSIS — E119 Type 2 diabetes mellitus without complications: Secondary | ICD-10-CM | POA: Diagnosis not present

## 2019-01-18 DIAGNOSIS — R52 Pain, unspecified: Secondary | ICD-10-CM | POA: Diagnosis not present

## 2019-01-18 DIAGNOSIS — I10 Essential (primary) hypertension: Secondary | ICD-10-CM | POA: Diagnosis not present

## 2019-01-18 DIAGNOSIS — J90 Pleural effusion, not elsewhere classified: Secondary | ICD-10-CM | POA: Diagnosis not present

## 2019-01-18 DIAGNOSIS — J9811 Atelectasis: Secondary | ICD-10-CM | POA: Diagnosis not present

## 2019-01-18 DIAGNOSIS — K219 Gastro-esophageal reflux disease without esophagitis: Secondary | ICD-10-CM | POA: Diagnosis not present

## 2019-01-18 DIAGNOSIS — I700 Atherosclerosis of aorta: Secondary | ICD-10-CM | POA: Diagnosis not present

## 2019-01-18 DIAGNOSIS — R0902 Hypoxemia: Secondary | ICD-10-CM | POA: Diagnosis not present

## 2019-01-18 DIAGNOSIS — R000 Tachycardia, unspecified: Secondary | ICD-10-CM | POA: Diagnosis not present

## 2019-01-18 DIAGNOSIS — R0602 Shortness of breath: Secondary | ICD-10-CM | POA: Diagnosis not present

## 2019-01-18 DIAGNOSIS — R0600 Dyspnea, unspecified: Secondary | ICD-10-CM | POA: Diagnosis not present

## 2019-01-18 DIAGNOSIS — I4891 Unspecified atrial fibrillation: Secondary | ICD-10-CM | POA: Diagnosis not present

## 2019-01-18 DIAGNOSIS — K6289 Other specified diseases of anus and rectum: Secondary | ICD-10-CM | POA: Diagnosis not present

## 2019-01-18 DIAGNOSIS — R410 Disorientation, unspecified: Secondary | ICD-10-CM | POA: Diagnosis not present

## 2019-01-18 DIAGNOSIS — R9431 Abnormal electrocardiogram [ECG] [EKG]: Secondary | ICD-10-CM | POA: Diagnosis not present

## 2019-01-18 DIAGNOSIS — I2510 Atherosclerotic heart disease of native coronary artery without angina pectoris: Secondary | ICD-10-CM | POA: Diagnosis not present

## 2019-01-18 DIAGNOSIS — I493 Ventricular premature depolarization: Secondary | ICD-10-CM | POA: Diagnosis not present

## 2019-01-18 DIAGNOSIS — J9602 Acute respiratory failure with hypercapnia: Secondary | ICD-10-CM | POA: Diagnosis not present

## 2019-01-18 DIAGNOSIS — R4020 Unspecified coma: Secondary | ICD-10-CM | POA: Diagnosis not present

## 2019-01-18 DIAGNOSIS — G309 Alzheimer's disease, unspecified: Secondary | ICD-10-CM | POA: Diagnosis not present

## 2019-01-18 DIAGNOSIS — R404 Transient alteration of awareness: Secondary | ICD-10-CM | POA: Diagnosis not present

## 2019-01-18 DIAGNOSIS — N281 Cyst of kidney, acquired: Secondary | ICD-10-CM | POA: Diagnosis not present

## 2019-01-18 DIAGNOSIS — J9622 Acute and chronic respiratory failure with hypercapnia: Secondary | ICD-10-CM | POA: Diagnosis not present

## 2019-01-19 ENCOUNTER — Telehealth: Payer: Self-pay

## 2019-01-19 MED ORDER — POLYETHYLENE GLYCOL 3350 17 G PO PACK
17.00 | PACK | ORAL | Status: DC
Start: ? — End: 2019-01-19

## 2019-01-19 MED ORDER — NATALCARE PIC FORTE PO
2.00 | ORAL | Status: DC
Start: ? — End: 2019-01-19

## 2019-01-19 MED ORDER — BISACODYL 10 MG RE SUPP
10.00 | RECTAL | Status: DC
Start: ? — End: 2019-01-19

## 2019-01-19 MED ORDER — GENERIC EXTERNAL MEDICATION
2.00 | Status: DC
Start: ? — End: 2019-01-19

## 2019-01-19 MED ORDER — POLYVINYL ALCOHOL 1.4 % OP SOLN
1.00 | OPHTHALMIC | Status: DC
Start: ? — End: 2019-01-19

## 2019-01-20 NOTE — Telephone Encounter (Signed)
I'm sad for his passing.  I called the wife to provide condolences, no answer.

## 2019-01-26 NOTE — Telephone Encounter (Signed)
Spoke with Ivan Anderson, condolences provided, she is at peace.

## 2019-02-08 NOTE — Telephone Encounter (Signed)
Pt passed away this morning at [facility].   Feb 17, 2019 4:53 AM EDT Pt pronounced TOD 0406 No respirations. No heart beat.   Electronically signed by: Caleen Essex, RN Feb 17, 2019 0500

## 2019-02-08 DEATH — deceased

## 2019-10-19 IMAGING — MR MR HEAD W/O CM
10 series · 43 of 48 positions shown · non-contrast
Comparison: Head CT 01/19/2018

CLINICAL DATA: Dizziness and gait instability.

EXAM:
MRI HEAD WITHOUT CONTRAST
TECHNIQUE: Multiplanar, multiecho pulse sequences of the brain and surrounding
structures were obtained without intravenous contrast.

[Series 3: T1 · sagittal · 5.0mm · 0.47mm/px · 3 of 23 slices shown]
[im 1/23]
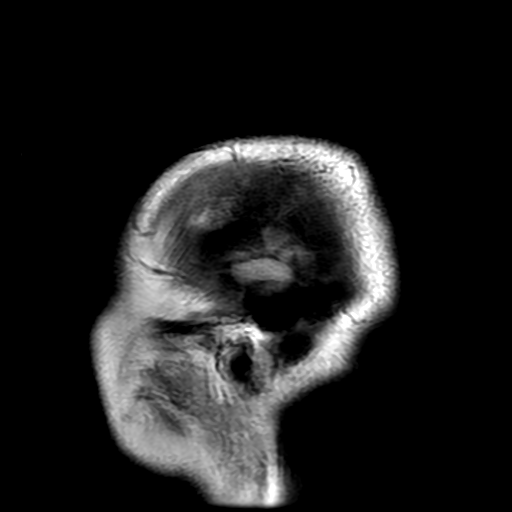
[im 12/23]
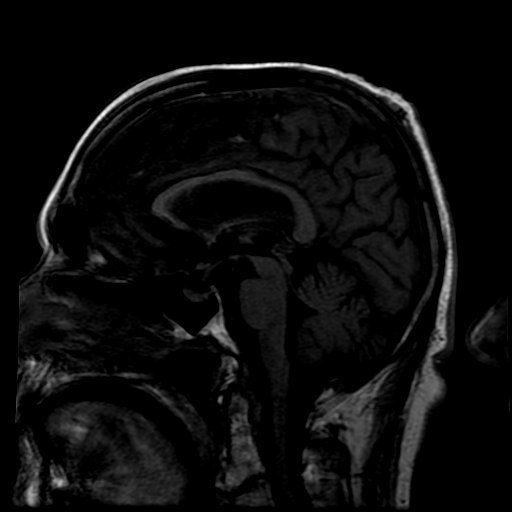
[im 23/23]
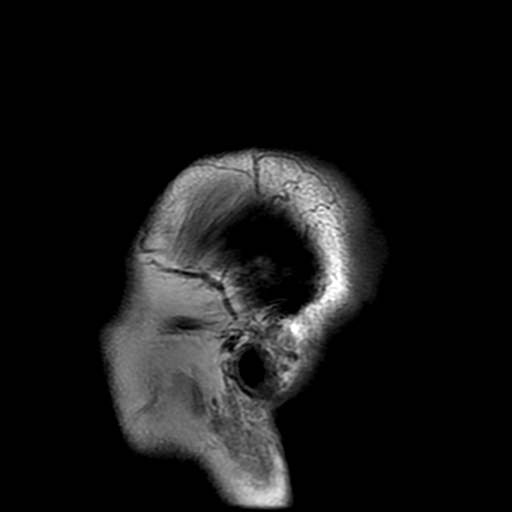

[Series 4: DWI · axial · 3.0mm · 1.09mm/px · z∈[-35,+112]mm · 11 of 100 slices shown (1 of 4)]
[im 1/100]
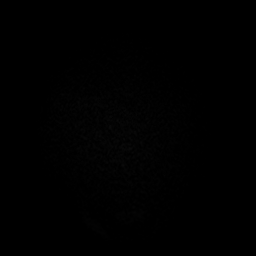
[im 10/100]
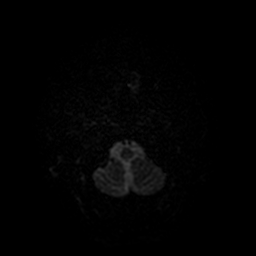
[im 20/100]
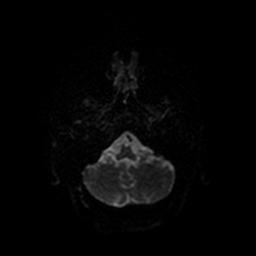
[im 30/100]
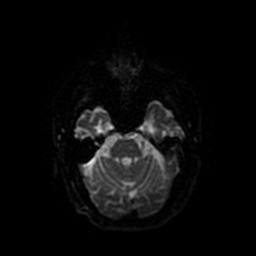
[im 40/100]
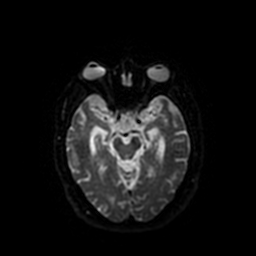
[im 50/100]
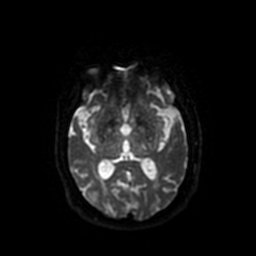
[im 60/100]
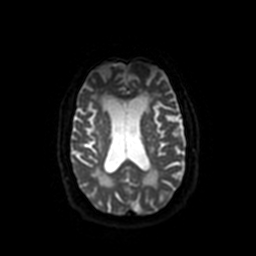
[im 70/100]
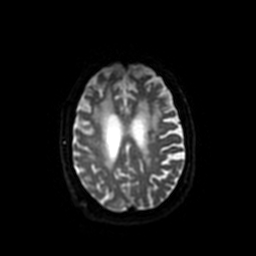
[im 80/100]
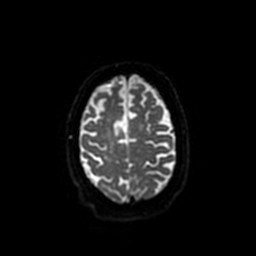
[im 90/100]
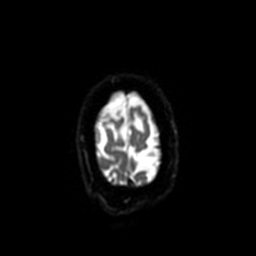
[im 100/100]
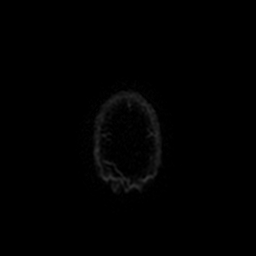

[Series 5: DWI · coronal · 4.0mm · 1.09mm/px · 9 of 86 slices shown (2 of 4)]
[im 1/86]
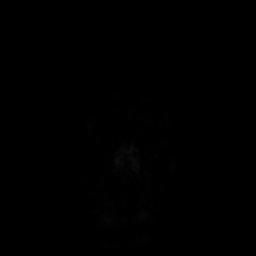
[im 11/86]
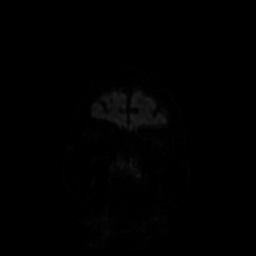
[im 22/86]
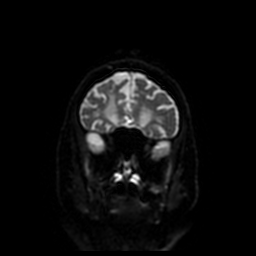
[im 32/86]
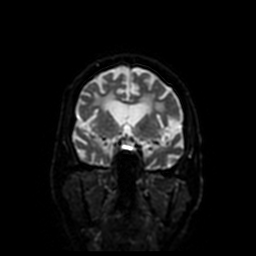
[im 43/86]
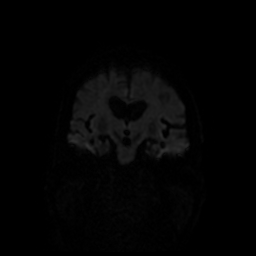
[im 54/86]
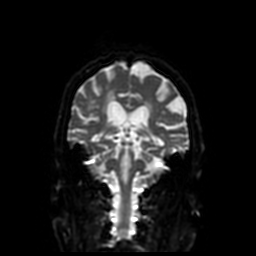
[im 64/86]
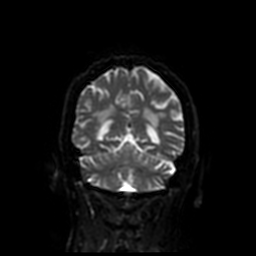
[im 75/86]
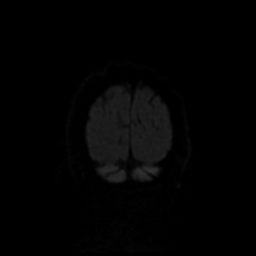
[im 86/86]
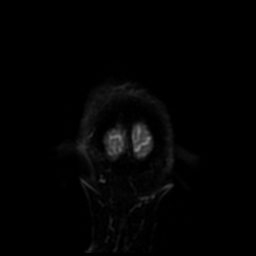

[Series 6: T2 · axial · 5.0mm · 0.86mm/px · z∈[-37,+110]mm · 2 of 22 slices shown (1 of 2)]
[im 1/22]
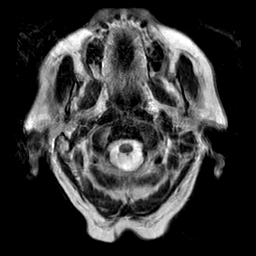
[im 22/22]
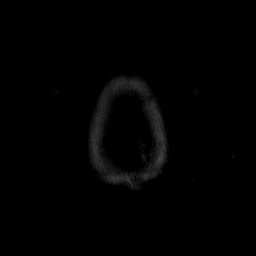

[Series 7: FLAIR · axial · 5.0mm · 0.43mm/px · z∈[-37,+110]mm · 2 of 22 slices shown]
[im 1/22]
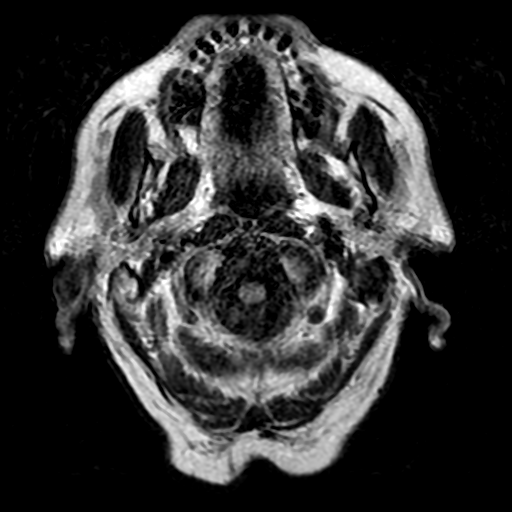
[im 22/22]
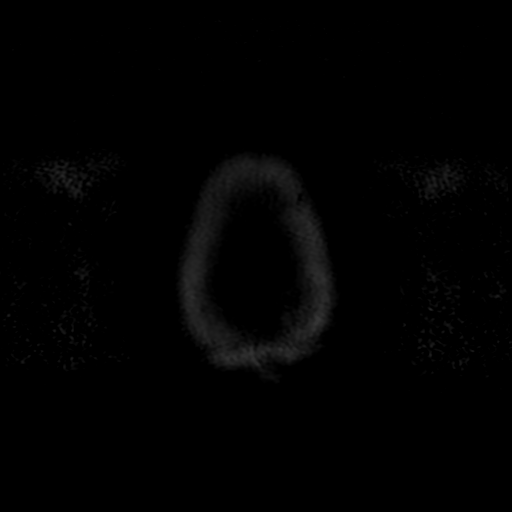

[Series 8: ax mpgr · axial · 5.0mm · 0.43mm/px · z∈[-37,+110]mm · 2 of 22 slices shown]
[im 1/22]
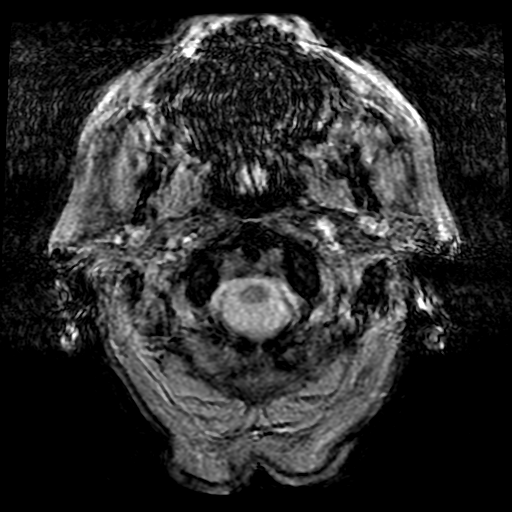
[im 22/22]
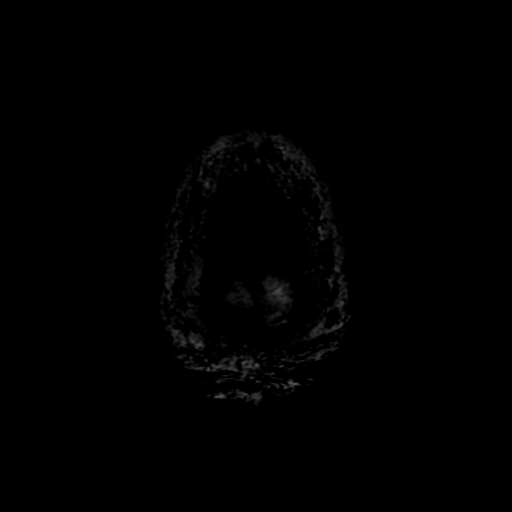

[Series 9: ax fspgr irp · axial · 3.0mm · 0.47mm/px · 1 of 52 slices shown]
[im 1/52]
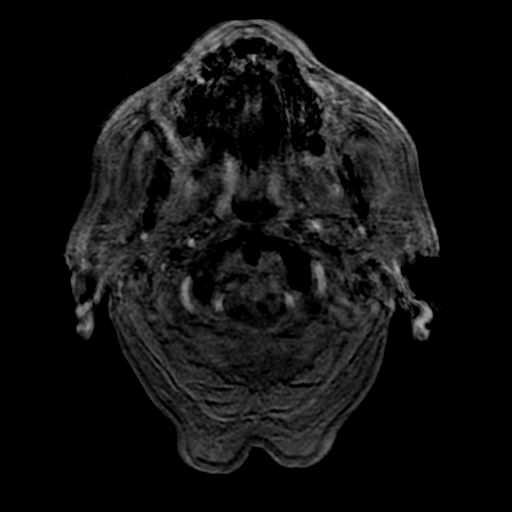

[Series 10: T2 · coronal · 5.0mm · 0.90mm/px · 3 of 27 slices shown (2 of 2)]
[im 1/27]
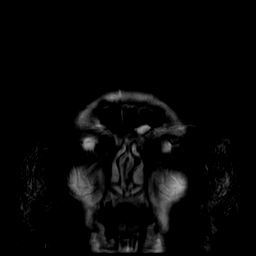
[im 14/27]
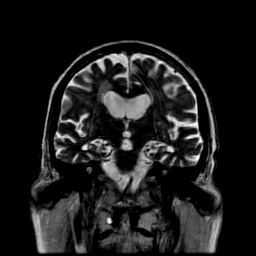
[im 27/27]
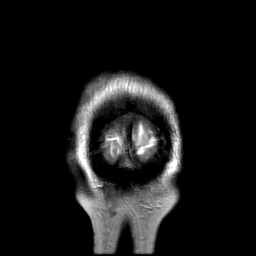

[Series 400: DWI · axial · 3.0mm · 1.09mm/px · z∈[-35,+112]mm · 5 of 50 slices shown (3 of 4)]
[im 1/50]
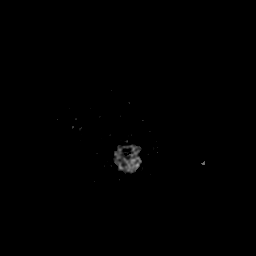
[im 13/50]
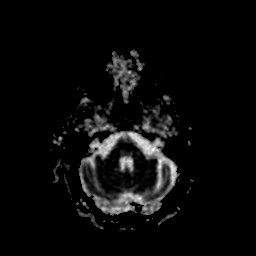
[im 25/50]
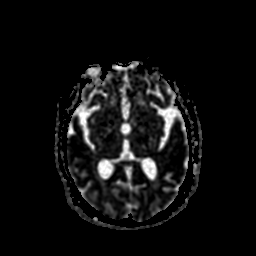
[im 37/50]
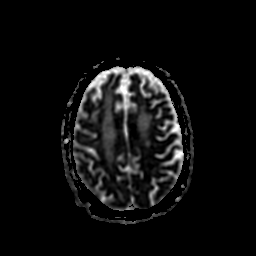
[im 50/50]
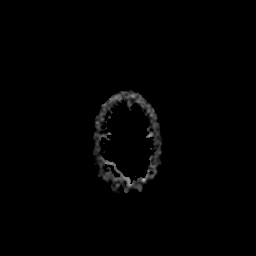

[Series 500: DWI · coronal · 4.0mm · 1.09mm/px · 5 of 43 slices shown (4 of 4)]
[im 1/43]
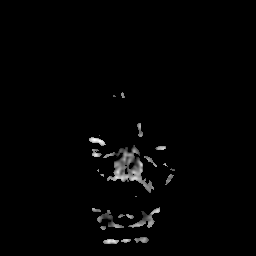
[im 11/43]
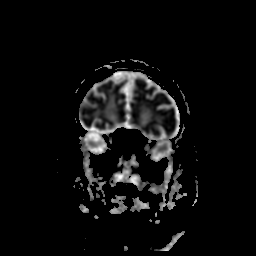
[im 22/43]
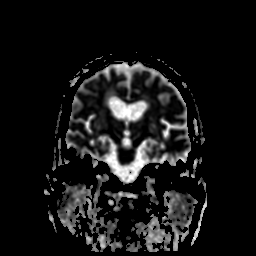
[im 32/43]
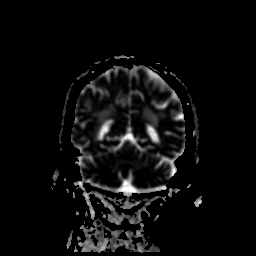
[im 43/43]
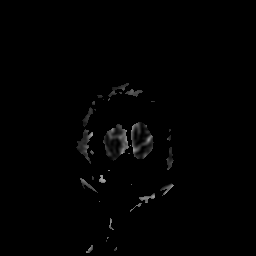

[43 of 48 positions shown; findings below may reference images not displayed]

FINDINGS: The examination is degraded by motion.

BRAIN: There is no acute infarct, acute hemorrhage, hydrocephalus or
extra-axial collection. The midline structures are normal. No
midline shift or other mass effect. Diffuse confluent hyperintense
T2-weighted signal within the periventricular, deep and
juxtacortical white matter, most commonly due to chronic ischemic
microangiopathy. Generalized atrophy without lobar predilection.
There are old thalamic and basal ganglia lacunar infarcts.
Susceptibility-sensitive sequences show no chronic microhemorrhage
or superficial siderosis.

VASCULAR: Major intracranial arterial and venous sinus flow voids
are normal.

SKULL AND UPPER CERVICAL SPINE: Calvarial bone marrow signal is
normal. There is no skull base mass. Visualized upper cervical spine
and soft tissues are normal.

SINUSES/ORBITS: No fluid levels or advanced mucosal thickening. No
mastoid or middle ear effusion. The orbits are normal.
IMPRESSION: 1. Images degraded by motion artifact.
2. No acute abnormality.
3. Severe chronic white matter disease, most commonly due to
ischemic microangiopathy.
